# Patient Record
Sex: Male | Born: 1970 | Race: White | Hispanic: No | Marital: Married | Smoking: Former smoker
Health system: Southern US, Community
[De-identification: ages and names within clinical notes are randomized; demographics above are authoritative.]

## PROBLEM LIST (undated history)

## (undated) DIAGNOSIS — K219 Gastro-esophageal reflux disease without esophagitis: Secondary | ICD-10-CM

## (undated) DIAGNOSIS — G4730 Sleep apnea, unspecified: Secondary | ICD-10-CM

## (undated) DIAGNOSIS — T7840XA Allergy, unspecified, initial encounter: Secondary | ICD-10-CM

## (undated) HISTORY — PX: TUBAL LIGATION: SHX77

## (undated) HISTORY — DX: Gastro-esophageal reflux disease without esophagitis: K21.9

## (undated) HISTORY — DX: Allergy, unspecified, initial encounter: T78.40XA

## (undated) HISTORY — DX: Sleep apnea, unspecified: G47.30

## (undated) HISTORY — PX: VASECTOMY: SHX75

---

## 2001-05-12 ENCOUNTER — Ambulatory Visit (HOSPITAL_BASED_OUTPATIENT_CLINIC_OR_DEPARTMENT_OTHER): Admission: RE | Admit: 2001-05-12 | Discharge: 2001-05-12 | Payer: Self-pay | Admitting: Neurology

## 2004-10-25 ENCOUNTER — Ambulatory Visit: Payer: Self-pay | Admitting: Family Medicine

## 2004-11-01 ENCOUNTER — Ambulatory Visit: Payer: Self-pay | Admitting: Family Medicine

## 2006-02-13 ENCOUNTER — Ambulatory Visit: Payer: Self-pay | Admitting: Family Medicine

## 2010-02-04 ENCOUNTER — Ambulatory Visit: Payer: Self-pay | Admitting: Family Medicine

## 2010-02-04 DIAGNOSIS — L03221 Cellulitis of neck: Secondary | ICD-10-CM

## 2010-02-04 DIAGNOSIS — L0211 Cutaneous abscess of neck: Secondary | ICD-10-CM

## 2010-02-05 ENCOUNTER — Encounter: Payer: Self-pay | Admitting: Family Medicine

## 2010-02-08 ENCOUNTER — Ambulatory Visit: Payer: Self-pay | Admitting: Family Medicine

## 2010-03-05 ENCOUNTER — Ambulatory Visit: Payer: Self-pay | Admitting: Family Medicine

## 2010-03-05 LAB — CONVERTED CEMR LAB
Albumin: 4.3 g/dL (ref 3.5–5.2)
Basophils Relative: 0.7 % (ref 0.0–3.0)
Bilirubin Urine: NEGATIVE
CO2: 29 meq/L (ref 19–32)
Chloride: 104 meq/L (ref 96–112)
Creatinine, Ser: 1 mg/dL (ref 0.4–1.5)
Eosinophils Absolute: 0.2 10*3/uL (ref 0.0–0.7)
Glucose, Urine, Semiquant: NEGATIVE
Hemoglobin: 14.9 g/dL (ref 13.0–17.0)
Ketones, urine, test strip: NEGATIVE
MCHC: 35 g/dL (ref 30.0–36.0)
MCV: 92.1 fL (ref 78.0–100.0)
Monocytes Absolute: 0.4 10*3/uL (ref 0.1–1.0)
Neutro Abs: 2.8 10*3/uL (ref 1.4–7.7)
Nitrite: NEGATIVE
RBC: 4.63 M/uL (ref 4.22–5.81)
Specific Gravity, Urine: <1.005
Total CHOL/HDL Ratio: 4
Total Protein: 7.3 g/dL (ref 6.0–8.3)
Triglycerides: 39 mg/dL (ref 0.0–149.0)

## 2010-03-12 ENCOUNTER — Ambulatory Visit: Payer: Self-pay | Admitting: Family Medicine

## 2010-03-12 DIAGNOSIS — G47419 Narcolepsy without cataplexy: Secondary | ICD-10-CM

## 2010-03-18 ENCOUNTER — Encounter: Payer: Self-pay | Admitting: Family Medicine

## 2010-08-19 ENCOUNTER — Telehealth: Payer: Self-pay | Admitting: Family Medicine

## 2010-12-26 NOTE — Progress Notes (Signed)
Summary: refill ritalin  Phone Note Call from Patient Call back at Home Phone [phone]   Reason for Call: Refill Medication Summary of Call: generic ritalin, 3mos refills.  Call when ready for pickup.  Rudy Jew, RN  August 19, 2010 12:08 PM  Initial call taken by: Rudy Jew, RN,  August 19, 2010 11:34 AM  Follow-up for Phone Call        Pt is aware that Rx is ready for pick-up Follow-up by: Kathrynn Speed CMA,  August 19, 2010 1:53 PM    New/Updated Medications: METHYLPHENIDATE HCL 10 MG TABS (METHYLPHENIDATE HCL) Take 1 tablet by mouth every morning METHYLPHENIDATE HCL 10 MG TABS (METHYLPHENIDATE HCL) take one tab by mouth every morning  fill in one month METHYLPHENIDATE HCL 10 MG TABS (METHYLPHENIDATE HCL) take one tab by mouth every morning  fill in two months Prescriptions: METHYLPHENIDATE HCL 10 MG TABS (METHYLPHENIDATE HCL) take one tab by mouth every morning  fill in two months  #30 x 0   Entered by:   Kern Reap CMA (AAMA)   Authorized by:   Roderick Pee MD   Signed by:   Kern Reap CMA (AAMA) on 08/19/2010   Method used:   Print then Give to Patient   RxID:   [id number] METHYLPHENIDATE HCL 10 MG TABS (METHYLPHENIDATE HCL) take one tab by mouth every morning  fill in one month  #30 x 0   Entered by:   Kern Reap CMA (AAMA)   Authorized by:   Roderick Pee MD   Signed by:   Kern Reap CMA (AAMA) on 08/19/2010   Method used:   Print then Give to Patient   RxID:   [id number] METHYLPHENIDATE HCL 10 MG TABS (METHYLPHENIDATE HCL) Take 1 tablet by mouth every morning  #30 x 0   Entered by:   Kern Reap CMA (AAMA)   Authorized by:   Roderick Pee MD   Signed by:   Kern Reap CMA (AAMA) on 08/19/2010   Method used:   Print then Give to Patient   RxID:   [id number]

## 2010-12-26 NOTE — Medication Information (Signed)
Summary: Coverage Approval for Methylphenidate Hcl  Coverage Approval for Methylphenidate Hcl   Imported By: Maryln Gottron 03/21/2010 10:42:28  _____________________________________________________________________  External Attachment:    Type:   Image     Comment:   External Document

## 2010-12-26 NOTE — Assessment & Plan Note (Signed)
Summary: reest---ingrown hair-ok per rachel//ccm   Vital Signs:  Patient profile:   40 year old male Height:      71 inches Weight:      188 pounds BMI:     26.32 Temp:     98.7 degrees F oral BP sitting:   120 / 84  (left arm) Cuff size:   regular  Vitals Entered By: Kern Reap CMA Duncan Dull) (February 04, 2010 11:46 AM)  Reason for Visit re-establish care, ingrown hair on neck  History of Present Illness: Samuel Patterson is a 40 year old male, who comes in today as a new patient for evaluation of a boil in the midline of the scalp x 1 week.  He notices a week, ago.  It's been draining pus.  It is not healed.  He saw his been in excellent health except he has narcolepsy.  He's treated by neurology and on Provigil to her milligrams daily  Preventive Screening-Counseling & Management  Alcohol-Tobacco     Smoking Status: never      Drug Use:  no.    Allergies (verified): No Known Drug Allergies  Past History:  Past medical, surgical, family and social histories (including risk factors) reviewed for relevance to current acute and chronic problems.  Past Surgical History: Denies surgical history  Family History: Reviewed history and no changes required. Father: healty Mother: healthy Siblings: 1 brother healthy  Social History: Reviewed history and no changes required. Occupation: [employer] Single Never Smoked Alcohol use-yes Drug use-no Smoking Status:  never Drug Use:  no  Review of Systems      See HPI  Physical Exam  General:  Well-developed,well-nourished,in no acute distress; alert,appropriate and cooperative throughout examination Skin:  Robin egg -sized low midline posterior scalp, pus was cultured   Impression & Recommendations:  Problem # 1:  CELLULITIS AND ABSCESS OF NECK (ICD-682.1) Assessment New  Orders: Prescription Created Electronically [phone]) T-Culture, Wound ([id number]) Specimen Handling (98119)  His updated medication list for this  problem includes:    Doxycycline Hyclate 100 Mg Caps (Doxycycline hyclate) .Marland Kitchen... Take 1 tablet by mouth two times a day    Septra Ds 800-160 Mg Tabs (Sulfamethoxazole-trimethoprim) .Marland Kitchen... Take 1 tablet by mouth two times a day  Complete Medication List: 1)  Provigil 200 Mg Tabs (Modafinil) .... Take one tab once daily 2)  Doxycycline Hyclate 100 Mg Caps (Doxycycline hyclate) .... Take 1 tablet by mouth two times a day 3)  Septra Ds 800-160 Mg Tabs (Sulfamethoxazole-trimethoprim) .... Take 1 tablet by mouth two times a day  Patient Instructions: 1)  begin doxycycline and Septra, one of each twice a day.  We will call you to the report on your culture return in two weeks for follow-up Prescriptions: SEPTRA DS 800-160 MG TABS (SULFAMETHOXAZOLE-TRIMETHOPRIM) Take 1 tablet by mouth two times a day  #30 x 1   Entered and Authorized by:   Roderick Pee MD   Signed by:   Roderick Pee MD on 02/04/2010   Method used:   Electronically to        [facility]. [phone]* (retail)       [address]       [place], [place]  [address]       Ph: [phone]       Fax: [phone]   RxID:   [id number] DOXYCYCLINE HYCLATE 100 MG CAPS (DOXYCYCLINE HYCLATE) Take 1 tablet by mouth two times a day  #30  x 1   Entered and Authorized by:   Roderick Pee MD   Signed by:   Roderick Pee MD on 02/04/2010   Method used:   Electronically to        [facility]. [phone]* (retail)       [address]       [place], [place]  [address]       Ph: [phone]       Fax: [phone]   RxID:   [id number]    Immunization History:  Tetanus/Td Immunization History:    Tetanus/Td:  historical (11/24/2001)

## 2010-12-26 NOTE — Assessment & Plan Note (Signed)
Summary: CPX // RS   Vital Signs:  Patient profile:   40 year old male Height:      69.5 inches Weight:      180 pounds Temp:     98.4 degrees F oral BP sitting:   114 / 80  (left arm) Cuff size:   regular  Vitals Entered By: Kern Reap CMA Duncan Dull) (March 12, 2010 2:08 PM) CC: cpx   CC:  cpx.  History of Present Illness: Samuel Patterson is a 40 year old, married male, nonsmoker, who comes in today for general physical examination  He saw his been in excellent health.  He had no chronic health problems except for narcolepsy for which he takes Provigil two in milligrams daily however, his insurance only covers that now with a $55 co-pay.  They will cover the methylphenidate with a $10 co-pay.  He also has had congestion, associated with changes in atmospheric pressure.  He has no allergic rhinitis component.  Review of systems negative.  Tetanus booster 2003  Allergies: No Known Drug Allergies  Past History:  Past medical, surgical, family and social histories (including risk factors) reviewed, and no changes noted (except as noted below).  Past Medical History: vasomotor rhinitis narcolepsy  Past Surgical History: Reviewed history from 02/04/2010 and no changes required. Denies surgical history  Family History: Reviewed history from 02/04/2010 and no changes required. Father: healty Mother: healthy Siblings: 1 brother healthy  Social History: Reviewed history from 02/04/2010 and no changes required. Occupation: [employer] Single Never Smoked Alcohol use-yes Drug use-no  Review of Systems      See HPI  Physical Exam  General:  Well-developed,well-nourished,in no acute distress; alert,appropriate and cooperative throughout examination Head:  Normocephalic and atraumatic without obvious abnormalities. No apparent alopecia or balding. Eyes:  No corneal or conjunctival inflammation noted. EOMI. Perrla. Funduscopic exam benign, without hemorrhages, exudates or papilledema.  Vision grossly normal. Ears:  External ear exam shows no significant lesions or deformities.  Otoscopic examination reveals clear canals, tympanic membranes are intact bilaterally without bulging, retraction, inflammation or discharge. Hearing is grossly normal bilaterally. Nose:  External nasal examination shows no deformity or inflammation. Nasal mucosa are pink and moist without lesions or exudates. Mouth:  Oral mucosa and oropharynx without lesions or exudates.  Teeth in good repair. Neck:  No deformities, masses, or tenderness noted. Chest Wall:  No deformities, masses, tenderness or gynecomastia noted. Breasts:  No masses or gynecomastia noted Lungs:  Normal respiratory effort, chest expands symmetrically. Lungs are clear to auscultation, no crackles or wheezes. Heart:  Normal rate and regular rhythm. S1 and S2 normal without gallop, murmur, click, rub or other extra sounds. Abdomen:  Bowel sounds positive,abdomen soft and non-tender without masses, organomegaly or hernias noted. Rectal:  No external abnormalities noted. Normal sphincter tone. No rectal masses or tenderness. Genitalia:  Testes bilaterally descended without nodularity, tenderness or masses. No scrotal masses or lesions. No penis lesions or urethral discharge. Prostate:  Prostate gland firm and smooth, no enlargement, nodularity, tenderness, mass, asymmetry or induration. Msk:  No deformity or scoliosis noted of thoracic or lumbar spine.   Pulses:  R and L carotid,radial,femoral,dorsalis pedis and posterior tibial pulses are full and equal bilaterally Extremities:  No clubbing, cyanosis, edema, or deformity noted with normal full range of motion of all joints.   Neurologic:  No cranial nerve deficits noted. Station and gait are normal. Plantar reflexes are down-going bilaterally. DTRs are symmetrical throughout. Sensory, motor and coordinative functions appear intact. Skin:  Intact without  suspicious lesions or rashes Cervical  Nodes:  No lymphadenopathy noted Axillary Nodes:  No palpable lymphadenopathy Inguinal Nodes:  No significant adenopathy Psych:  Cognition and judgment appear intact. Alert and cooperative with normal attention span and concentration. No apparent delusions, illusions, hallucinations   Impression & Recommendations:  Problem # 1:  Preventive Health Care (ICD-V70.0) Assessment Unchanged  Orders: EKG w/ Interpretation (93000)  Problem # 2:  NARCOLEPSY WITHOUT CATAPLEXY (ICD-347.00) Assessment: New  Orders: EKG w/ Interpretation (93000)  Complete Medication List: 1)  Daily Multi Tabs (Multiple vitamins-minerals) .... Once daily 2)  Triple Flex 500-400-125 Mg Tabs (Glucosamine-chondroitin-msm) .... Once daily 3)  Methylphenidate Hcl 10 Mg Tabs (Methylphenidate hcl) .... Take 1 tablet by mouth every morning  Patient Instructions: 1)  take 10 mg of methylphenidate daily. 2)  Return yearly for your annual exam Prescriptions: METHYLPHENIDATE HCL 10 MG TABS (METHYLPHENIDATE HCL) Take 1 tablet by mouth every morning  #30 x 0   Entered and Authorized by:   Roderick Pee MD   Signed by:   Roderick Pee MD on 03/12/2010   Method used:   Print then Give to Patient   RxID:   [id number] METHYLPHENIDATE HCL 10 MG TABS (METHYLPHENIDATE HCL) Take 1 tablet by mouth every morning  #30 x 0   Entered and Authorized by:   Roderick Pee MD   Signed by:   Roderick Pee MD on 03/12/2010   Method used:   Print then Give to Patient   RxID:   [id number] METHYLPHENIDATE HCL 10 MG TABS (METHYLPHENIDATE HCL) Take 1 tablet by mouth every morning  #30 x 0   Entered and Authorized by:   Roderick Pee MD   Signed by:   Roderick Pee MD on 03/12/2010   Method used:   Print then Give to Patient   RxID:   [id number]

## 2010-12-26 NOTE — Assessment & Plan Note (Signed)
Summary: follow up culture - rv   Vital Signs:  Patient profile:   40 year old male BP sitting:   110 / 80  (left arm) Cuff size:   regular  Vitals Entered By: Kern Reap CMA Duncan Dull) (February 08, 2010 11:49 AM)  Reason for Visit follow up office visit  History of Present Illness: Akin is a 40 year old male, nonsmoker, who comes in today for follow-up of MRSA.  He had a boil on the nape of his neck.  A week ago.  We started him on doxycycline and Septra one of each b.i.d.  The lesion is 99% gone.  Culture grew out MRSA  Allergies: No Known Drug Allergies  Review of Systems      See HPI  Physical Exam  General:  Well-developed,well-nourished,in no acute distress; alert,appropriate and cooperative throughout examination Skin:  lesion completely resolved   Impression & Recommendations:  Problem # 1:  CELLULITIS AND ABSCESS OF NECK (ICD-682.1) Assessment Improved  His updated medication list for this problem includes:    Doxycycline Hyclate 100 Mg Caps (Doxycycline hyclate) .Marland Kitchen... Take 1 tablet by mouth two times a day    Septra Ds 800-160 Mg Tabs (Sulfamethoxazole-trimethoprim) .Marland Kitchen... Take 1 tablet by mouth two times a day  Complete Medication List: 1)  Provigil 200 Mg Tabs (Modafinil) .... Take one tab once daily 2)  Doxycycline Hyclate 100 Mg Caps (Doxycycline hyclate) .... Take 1 tablet by mouth two times a day 3)  Septra Ds 800-160 Mg Tabs (Sulfamethoxazole-trimethoprim) .... Take 1 tablet by mouth two times a day  Patient Instructions: 1)  take doxycycline and Septra, one of each daily times bottle empty.  Return p.r.n.

## 2011-05-14 ENCOUNTER — Encounter: Payer: Self-pay | Admitting: Family Medicine

## 2011-05-15 ENCOUNTER — Encounter: Payer: Self-pay | Admitting: Family Medicine

## 2011-05-15 ENCOUNTER — Ambulatory Visit (INDEPENDENT_AMBULATORY_CARE_PROVIDER_SITE_OTHER): Payer: BC Managed Care – PPO | Admitting: Family Medicine

## 2011-05-15 VITALS — BP 120/84 | Temp 98.0°F | Ht 71.0 in | Wt 186.0 lb

## 2011-05-15 DIAGNOSIS — G47419 Narcolepsy without cataplexy: Secondary | ICD-10-CM

## 2011-05-15 MED ORDER — METHYLPHENIDATE HCL 10 MG PO TABS: ORAL_TABLET | ORAL | Status: DC

## 2011-05-15 NOTE — Progress Notes (Signed)
  Subjective:    Patient ID: Samuel Patterson, male    DOB: 1971-06-10, 40 y.o.   MRN: [id number]  HPIMichael is a delightful 40 year old, single male, nonsmoker, who comes in today for evaluation of two problems.  He has a history of narcolepsy for which he takes Ritalin 10 mg daily.  He says is working well, but two to 3 days a week and afternoon, and not off.  We discussed the fact that he might want to increase the dose to twice daily. He says his fiance says that he snores a lot.  No sleep apnea    Review of Systems General pulmonary via systems otherwise negative    Objective:   Physical Exam Well developed, well nourished to male no acute distress.  HEENT pertinent that the septum is deviated to the right with significant obstruction.       Assessment & Plan:  Narcolepsy increase Ritalin to 10 b.i.d.  Septal deviation.  ENT consult p.r.n.

## 2011-05-15 NOTE — Patient Instructions (Addendum)
Increase the dose to 10 mg twice daily.  Call when you or two weeks from being out of his third prescription for refills.  Follow-up with me in one year or sooner if any problems

## 2011-11-03 ENCOUNTER — Ambulatory Visit (INDEPENDENT_AMBULATORY_CARE_PROVIDER_SITE_OTHER): Payer: BC Managed Care – PPO | Admitting: Family Medicine

## 2011-11-03 ENCOUNTER — Encounter: Payer: Self-pay | Admitting: Family Medicine

## 2011-11-03 VITALS — BP 110/80 | Temp 98.3°F | Wt 186.0 lb

## 2011-11-03 DIAGNOSIS — K644 Residual hemorrhoidal skin tags: Secondary | ICD-10-CM | POA: Insufficient documentation

## 2011-11-03 DIAGNOSIS — G47419 Narcolepsy without cataplexy: Secondary | ICD-10-CM

## 2011-11-03 MED ORDER — HYDROCORTISONE 2.5 % RE CREA: TOPICAL_CREAM | RECTAL | Status: AC

## 2011-11-03 MED ORDER — METHYLPHENIDATE HCL 10 MG PO TABS: ORAL_TABLET | ORAL | Status: DC

## 2011-11-03 NOTE — Patient Instructions (Signed)
Soak nightly  Apply .  The Anusol cream twice daily.  Return p.r.n.

## 2011-11-03 NOTE — Progress Notes (Signed)
  Subjective:    Patient ID: Samuel Patterson, male    DOB: 19-Jul-1971, 40 y.o.   MRN: [id number]  HPI Samuel Patterson is a 40 year old male, who comes in today for evaluation of external hemorrhoids.  This is been bothering him for a couple weeks.  Pain with external somewhat painful.  Occasional bleeding.  Bowel movements normal   Review of Systems General and GI review of systems otherwise negative    Objective:   Physical Exam Well-developed well-nourished, male in no acute distress.  Examination of rectum shows small areas of excoriation redness.  No obvious hemorrhoids      Assessment & Plan:  External hemorrhoids with a lot of irritation.  Plan warm soaks Anusol cream.  Return p.r.n.

## 2012-02-12 ENCOUNTER — Other Ambulatory Visit: Payer: Self-pay | Admitting: Otolaryngology

## 2012-02-12 ENCOUNTER — Ambulatory Visit
Admission: RE | Admit: 2012-02-12 | Discharge: 2012-02-12 | Disposition: A | Discharge status: Home / Self Care | Payer: BC Managed Care – PPO | Source: Ambulatory Visit | Attending: Otolaryngology | Admitting: Otolaryngology

## 2012-03-01 ENCOUNTER — Encounter: Payer: Self-pay | Admitting: Family Medicine

## 2012-03-01 ENCOUNTER — Ambulatory Visit (INDEPENDENT_AMBULATORY_CARE_PROVIDER_SITE_OTHER): Payer: BC Managed Care – PPO | Admitting: Family Medicine

## 2012-03-01 VITALS — BP 104/78 | Temp 100.3°F | Wt 191.0 lb

## 2012-03-01 DIAGNOSIS — L03317 Cellulitis of buttock: Secondary | ICD-10-CM

## 2012-03-01 DIAGNOSIS — L0231 Cutaneous abscess of buttock: Secondary | ICD-10-CM | POA: Insufficient documentation

## 2012-03-01 MED ORDER — DOXYCYCLINE HYCLATE 100 MG PO TABS
100.0000 mg | ORAL_TABLET | Freq: Two times a day (BID) | ORAL | Status: AC
Start: 2012-03-01 — End: 2012-03-11

## 2012-03-01 MED ORDER — SULFAMETHOXAZOLE-TRIMETHOPRIM 800-160 MG PO TABS: 1.0000 | ORAL_TABLET | Freq: Two times a day (BID) | ORAL | Status: AC

## 2012-03-01 MED ORDER — CEFTRIAXONE SODIUM 1 G IJ SOLR
1.0000 g | INTRAMUSCULAR | Status: AC
  Administered 2012-03-01: 1 g via INTRAMUSCULAR

## 2012-03-01 NOTE — Patient Instructions (Signed)
Rest at home  Soak in a hot tub twice daily  Bed rest with heating padlllllllllll low heat and cover the heating pad with a towel sado Bernier skin  Doxycycline and Septra,,,,,,,,,,,,,, one of each twice daily   return on Thursday for followup

## 2012-03-01 NOTE — Progress Notes (Signed)
  Subjective:    Patient ID: Samuel Patterson, male    DOB: Feb 23, 1971, 41 y.o.   MRN: [id number]  HPI Samuel Patterson is a 41 year old married male nonsmoker who comes in today for evaluation of an abscess on his right buttocks  On Saturday he thought he was getting the flu he had fever and chills no viral symptoms then Sunday he noticed some pain and swelling and redness in his right buttocks area. He's had MRSA before   Review of Systems    general and infectious review of systems otherwise negative Objective:   Physical Exam  Well-developed well-nourished male in no acute distress examination of the right buttock shows a golf ball size abscess pus draining culture done      Assessment & Plan:  Cellulitis with abscess right buttocks probable MRSA plan 1 g of Rocephin IM doxy and Septra rest at home followup in 3 days

## 2012-03-04 ENCOUNTER — Encounter: Payer: Self-pay | Admitting: Family Medicine

## 2012-03-04 ENCOUNTER — Ambulatory Visit (INDEPENDENT_AMBULATORY_CARE_PROVIDER_SITE_OTHER): Payer: BC Managed Care – PPO | Admitting: Family Medicine

## 2012-03-04 VITALS — BP 110/70 | Temp 98.3°F | Wt 185.0 lb

## 2012-03-04 DIAGNOSIS — L0231 Cutaneous abscess of buttock: Secondary | ICD-10-CM

## 2012-03-04 DIAGNOSIS — L03317 Cellulitis of buttock: Secondary | ICD-10-CM

## 2012-03-04 LAB — WOUND CULTURE

## 2012-03-04 NOTE — Progress Notes (Signed)
  Subjective:    Patient ID: [name], male    DOB: 06-23-1971, 41 y.o.   MRN: [id number]  HPI Samuel Patterson is a 41 year old male who comes in today for followup of an abscess on his right proximal  He's on a combination of doxycycline and Septra and the lesion is draining. It's much smaller still red but less culture shows MRSA   Review of Systems    general and dermatologic review of systems otherwise negative well-developed well-nourished male in no acute distress examination of skin shows a marble size abscess the other day was about the size of a golf ball the redness is diminished but not gone still draining nicely Objective:   Physical Exam  MRSA abscess right buttocks improving with hot soaks and antibiotics continue above return when necessary      Assessment & Plan:  MRSA continue

## 2012-03-04 NOTE — Patient Instructions (Signed)
Continue the doxycycline and Septra. If when you finish the first round the lesion is completely healed no redness no draining then stop the antibiotics if however there is still any redness refill the Septra and doxycycline and take it for another round return when necessary or

## 2012-05-05 ENCOUNTER — Other Ambulatory Visit: Payer: Self-pay | Admitting: Family Medicine

## 2012-05-05 DIAGNOSIS — G47419 Narcolepsy without cataplexy: Secondary | ICD-10-CM

## 2012-05-05 MED ORDER — METHYLPHENIDATE HCL 10 MG PO TABS: ORAL_TABLET | ORAL | Status: DC

## 2012-05-05 NOTE — Telephone Encounter (Signed)
Rx ready for pick up and patient is aware 

## 2012-05-05 NOTE — Telephone Encounter (Signed)
Pt is requesting 3 separate new rx for ritalin 10mg  for 3 months.

## 2012-09-13 ENCOUNTER — Ambulatory Visit (INDEPENDENT_AMBULATORY_CARE_PROVIDER_SITE_OTHER): Payer: BC Managed Care – PPO | Admitting: Family Medicine

## 2012-09-13 ENCOUNTER — Encounter: Payer: Self-pay | Admitting: Family Medicine

## 2012-09-13 VITALS — BP 104/80 | HR 85 | Temp 98.2°F | Wt 188.0 lb

## 2012-09-13 DIAGNOSIS — L0390 Cellulitis, unspecified: Secondary | ICD-10-CM

## 2012-09-13 DIAGNOSIS — W57XXXA Bitten or stung by nonvenomous insect and other nonvenomous arthropods, initial encounter: Secondary | ICD-10-CM

## 2012-09-13 DIAGNOSIS — T148 Other injury of unspecified body region: Secondary | ICD-10-CM

## 2012-09-13 DIAGNOSIS — Z23 Encounter for immunization: Secondary | ICD-10-CM

## 2012-09-13 MED ORDER — DOXYCYCLINE HYCLATE 100 MG PO TABS: 100.0000 mg | ORAL_TABLET | Freq: Two times a day (BID) | ORAL | Status: DC

## 2012-09-13 NOTE — Progress Notes (Signed)
Chief Complaint  Patient presents with  . Tick Removal    HPI:  Tick Bite: -noticed tick attached on R side yesterday, patient removed tick, thinks it was biting for 1 day -has been itchy and a little red and tender in area -denies: rash, fevers, HAs, chills -tetanus: > 10 years ago   ROS: See pertinent positives and negatives per HPI.  Past Medical History  Diagnosis Date  . Narcolepsy   . Allergy     vasomotor rhinitis    No family history on file.  History   Social History  . Marital Status: Married    Spouse Name: N/A    Number of Children: N/A  . Years of Education: N/A   Social History Main Topics  . Smoking status: Never Smoker   . Smokeless tobacco: None  . Alcohol Use: Yes  . Drug Use: No  . Sexually Active:    Other Topics Concern  . None   Social History Narrative  . None    Current outpatient prescriptions:Casanthranol-Docusate Sodium 30-100 MG CAPS, Take by mouth.  , Disp: , Rfl: ;  Glucosamine-Chondroitin-MSM (TRIPLE FLEX) 500-400-125 MG TABS, Take by mouth daily.  , Disp: , Rfl: ;  hydrocortisone (ANUSOL-HC) 2.5 % rectal cream, Apply rectally 2 times daily, Disp: 30 g, Rfl: 2;  methylphenidate (RITALIN) 10 MG tablet, One p.o. B.i.d., Disp: 60 tablet, Rfl: 0 methylphenidate (RITALIN) 10 MG tablet, One p.o. B.i.d., Disp: 60 tablet, Rfl: 0;  methylphenidate (RITALIN) 10 MG tablet, One p.o. B.i.d., Disp: 60 tablet, Rfl: 0;  Multiple Vitamin (MULTIVITAMIN) tablet, Take 1 tablet by mouth daily.  , Disp: , Rfl: ;  doxycycline (VIBRA-TABS) 100 MG tablet, Take 1 tablet (100 mg total) by mouth 2 (two) times daily., Disp: 20 tablet, Rfl: 0  EXAM:  Filed Vitals:   09/13/12 1007  BP: 104/80  Pulse: 85  Temp: 98.2 F (36.8 C)    There is no height on file to calculate BMI.  GENERAL: vitals reviewed and listed above, alert, oriented, appears well hydrated and in no acute distress  HEENT: atraumatic, conjunttiva clear, no obvious abnormalities on  inspection of external nose and ears  NECK: no obvious masses on inspection  SKIN: small erythematous papule R chest with small amount surrounding erythema (approc 1.5 cm in diameter)  MS: moves all extremities without noticeable abnormality  PSYCH: pleasant and cooperative, no obvious depression or anxiety  ASSESSMENT AND PLAN:  Discussed the following assessment and plan:  1. Cellulitis  doxycycline (VIBRA-TABS) 100 MG tablet  2. Tick bite  doxycycline (VIBRA-TABS) 100 MG tablet   -tdap -abx per above for mild cellulitis and pt concern. Discussed risks and benefits. -Patient advised to return or notify a doctor immediately if symptoms worsen or persist or new concerns arise.  There are no Patient Instructions on file for this visit.   Kriste Basque R.

## 2012-09-13 NOTE — Patient Instructions (Addendum)
-  mild cellulitis surrounding tick bite - will treat with antibiotic  -if increased redness, swelling, fevers or other concerns see your doctor

## 2012-09-13 NOTE — Addendum Note (Signed)
Addended by: Azucena Freed on: 09/13/2012 01:37 PM   Modules accepted: Orders

## 2012-11-04 ENCOUNTER — Other Ambulatory Visit (INDEPENDENT_AMBULATORY_CARE_PROVIDER_SITE_OTHER): Payer: BC Managed Care – PPO

## 2012-11-04 DIAGNOSIS — Z0000 Encounter for general adult medical examination without abnormal findings: Secondary | ICD-10-CM

## 2012-11-04 LAB — LIPID PANEL
Cholesterol: 199 mg/dL (ref 0–200)
HDL: 41.5 mg/dL (ref >39.00)
LDL Cholesterol: 143 mg/dL — ABNORMAL HIGH (ref 0–99)
Total CHOL/HDL Ratio: 5
Triglycerides: 71 mg/dL (ref 0.0–149.0)
VLDL: 14.2 mg/dL (ref 0.0–40.0)

## 2012-11-04 LAB — POCT URINALYSIS DIPSTICK
Bilirubin, UA: NEGATIVE
Glucose, UA: NEGATIVE
Ketones, UA: NEGATIVE
Leukocytes, UA: NEGATIVE
Nitrite, UA: NEGATIVE
Protein, UA: NEGATIVE
Spec Grav, UA: 1.01
Urobilinogen, UA: 0.2
pH, UA: 7

## 2012-11-04 LAB — CBC WITH DIFFERENTIAL/PLATELET
Basophils Absolute: 0 10*3/uL (ref 0.0–0.1)
Basophils Relative: 0.4 % (ref 0.0–3.0)
Eosinophils Absolute: 0.2 10*3/uL (ref 0.0–0.7)
Eosinophils Relative: 3.4 % (ref 0.0–5.0)
HCT: 44.3 % (ref 39.0–52.0)
Hemoglobin: 15 g/dL (ref 13.0–17.0)
Lymphocytes Relative: 18.8 % (ref 12.0–46.0)
Lymphs Abs: 1.2 10*3/uL (ref 0.7–4.0)
MCHC: 33.7 g/dL (ref 30.0–36.0)
MCV: 90.6 fl (ref 78.0–100.0)
Monocytes Absolute: 0.5 10*3/uL (ref 0.1–1.0)
Monocytes Relative: 8.5 % (ref 3.0–12.0)
Neutro Abs: 4.4 10*3/uL (ref 1.4–7.7)
Neutrophils Relative %: 68.9 % (ref 43.0–77.0)
Platelets: 200 10*3/uL (ref 150.0–400.0)
RBC: 4.9 Mil/uL (ref 4.22–5.81)
RDW: 13.5 % (ref 11.5–14.6)
WBC: 6.4 10*3/uL (ref 4.5–10.5)

## 2012-11-04 LAB — BASIC METABOLIC PANEL
BUN: 14 mg/dL (ref 6–23)
CO2: 29 mEq/L (ref 19–32)
Calcium: 8.9 mg/dL (ref 8.4–10.5)
GFR: 106.01 mL/min (ref >60.00)
Glucose, Bld: 97 mg/dL (ref 70–99)

## 2012-11-04 LAB — TSH: TSH: 2.56 u[IU]/mL (ref 0.35–5.50)

## 2012-11-04 LAB — HEPATIC FUNCTION PANEL
Albumin: 4.1 g/dL (ref 3.5–5.2)
Alkaline Phosphatase: 53 U/L (ref 39–117)
Total Protein: 7 g/dL (ref 6.0–8.3)

## 2012-11-09 ENCOUNTER — Encounter: Payer: Self-pay | Admitting: Family Medicine

## 2012-11-09 ENCOUNTER — Ambulatory Visit (INDEPENDENT_AMBULATORY_CARE_PROVIDER_SITE_OTHER): Payer: BC Managed Care – PPO | Admitting: Family Medicine

## 2012-11-09 VITALS — BP 120/80 | Temp 97.9°F | Ht 70.5 in | Wt 190.0 lb

## 2012-11-09 DIAGNOSIS — F988 Other specified behavioral and emotional disorders with onset usually occurring in childhood and adolescence: Secondary | ICD-10-CM | POA: Insufficient documentation

## 2012-11-09 DIAGNOSIS — G47419 Narcolepsy without cataplexy: Secondary | ICD-10-CM

## 2012-11-09 MED ORDER — METHYLPHENIDATE HCL 10 MG PO TABS: ORAL_TABLET | ORAL | Status: DC

## 2012-11-09 NOTE — Patient Instructions (Signed)
Continue your current medications  When you're 2 weeks from being out of your third prescription for Ritalin call and leave a voice mail for Fleet Contras so we can renew your medicines every 90 days  Return in one year for general physical sooner if any problems

## 2012-11-09 NOTE — Progress Notes (Signed)
  Subjective:    Patient ID: Samuel Patterson, male    DOB: Aug 13, 1971, 41 y.o.   MRN: [id number]  HPI Samuel Patterson is a 41 year old male who comes in today for general physical examination. He has a history of narcolepsy and is on Ritalin 10 mg twice a day and doing well no complications  He gets routine eye care, dental care, tetanus 2013, seasonal flu shot September 2013   Review of Systems  Constitutional: Negative.   HENT: Negative.   Eyes: Negative.   Respiratory: Negative.   Cardiovascular: Negative.   Gastrointestinal: Negative.   Genitourinary: Negative.   Musculoskeletal: Negative.   Skin: Negative.   Neurological: Negative.   Hematological: Negative.   Psychiatric/Behavioral: Negative.        Objective:   Physical Exam  Constitutional: He is oriented to person, place, and time. He appears well-developed and well-nourished.  HENT:  Head: Normocephalic and atraumatic.  Right Ear: External ear normal.  Left Ear: External ear normal.  Nose: Nose normal.  Mouth/Throat: Oropharynx is clear and moist.  Eyes: Conjunctivae normal and EOM are normal. Pupils are equal, round, and reactive to light.  Neck: Normal range of motion. Neck supple. No JVD present. No tracheal deviation present. No thyromegaly present.  Cardiovascular: Normal rate, regular rhythm, normal heart sounds and intact distal pulses.  Exam reveals no gallop and no friction rub.   No murmur heard. Pulmonary/Chest: Effort normal and breath sounds normal. No stridor. No respiratory distress. He has no wheezes. He has no rales. He exhibits no tenderness.  Abdominal: Soft. Bowel sounds are normal. He exhibits no distension and no mass. There is no tenderness. There is no rebound and no guarding.  Genitourinary: Rectum normal, prostate normal and penis normal. Guaiac negative stool. No penile tenderness.  Musculoskeletal: Normal range of motion. He exhibits no edema and no tenderness.  Lymphadenopathy:    He has no  cervical adenopathy.  Neurological: He is alert and oriented to person, place, and time. He has normal reflexes. No cranial nerve deficit. He exhibits normal muscle tone.  Skin: Skin is warm and dry. No rash noted. No erythema. No pallor.  Psychiatric: He has a normal mood and affect. His behavior is normal. Judgment and thought content normal.          Assessment & Plan:  Healthy male  Narcolepsy continue Ritalin 10 mg twice a day

## 2013-04-14 ENCOUNTER — Other Ambulatory Visit: Payer: Self-pay | Admitting: *Deleted

## 2013-04-14 MED ORDER — HYDROCORTISONE ACETATE 25 MG RE SUPP: 25.0000 mg | Freq: Two times a day (BID) | RECTAL | Status: DC

## 2013-06-24 ENCOUNTER — Other Ambulatory Visit: Payer: Self-pay | Admitting: *Deleted

## 2013-06-24 DIAGNOSIS — G47419 Narcolepsy without cataplexy: Secondary | ICD-10-CM

## 2013-06-24 MED ORDER — METHYLPHENIDATE HCL 10 MG PO TABS: ORAL_TABLET | ORAL | Status: DC

## 2013-06-24 NOTE — Telephone Encounter (Signed)
rx ready for pick up and patient is aware  

## 2013-06-29 ENCOUNTER — Telehealth: Payer: Self-pay | Admitting: Family Medicine

## 2013-06-29 NOTE — Telephone Encounter (Signed)
Samuel Patterson, this pt left me a voice mail when I was out. He states he is still unable to get his Ritalin filled. I am confused as to if he needs actual rx, or if pharm will not fill. His prior Berkley Harvey was done July 1, and is effective 05/03/13 - 05/24/2014, so that is not the reason. If the pharmacy is telling him insurance keeps kicking it back, then the pharmacy needs to call the [facility] Help Desk to get assistance processing the claim. Can you check with the pt to see what he means when he states he cannot get his Ritalin rx? Let me know if there is anything I can do.

## 2013-06-30 NOTE — Telephone Encounter (Signed)
Spoke with patient and informed him of the approval of his Ritalin

## 2013-12-08 ENCOUNTER — Other Ambulatory Visit (INDEPENDENT_AMBULATORY_CARE_PROVIDER_SITE_OTHER): Payer: BC Managed Care – PPO

## 2013-12-08 DIAGNOSIS — Z0000 Encounter for general adult medical examination without abnormal findings: Secondary | ICD-10-CM

## 2013-12-08 LAB — CBC WITH DIFFERENTIAL/PLATELET
BASOS ABS: 0 10*3/uL (ref 0.0–0.1)
Basophils Relative: 0.6 % (ref 0.0–3.0)
EOS ABS: 0.2 10*3/uL (ref 0.0–0.7)
Eosinophils Relative: 3.1 % (ref 0.0–5.0)
HCT: 44.2 % (ref 39.0–52.0)
Hemoglobin: 15.1 g/dL (ref 13.0–17.0)
LYMPHS PCT: 23 % (ref 12.0–46.0)
Lymphs Abs: 1.4 10*3/uL (ref 0.7–4.0)
MCHC: 34.1 g/dL (ref 30.0–36.0)
MCV: 90.2 fl (ref 78.0–100.0)
Monocytes Absolute: 0.5 10*3/uL (ref 0.1–1.0)
Monocytes Relative: 8.7 % (ref 3.0–12.0)
NEUTROS ABS: 4 10*3/uL (ref 1.4–7.7)
NEUTROS PCT: 64.6 % (ref 43.0–77.0)
PLATELETS: 201 10*3/uL (ref 150.0–400.0)
RBC: 4.9 Mil/uL (ref 4.22–5.81)
RDW: 13.1 % (ref 11.5–14.6)
WBC: 6.3 10*3/uL (ref 4.5–10.5)

## 2013-12-08 LAB — BASIC METABOLIC PANEL
BUN: 11 mg/dL (ref 6–23)
CALCIUM: 9.3 mg/dL (ref 8.4–10.5)
CO2: 28 meq/L (ref 19–32)
CREATININE: 1 mg/dL (ref 0.4–1.5)
Chloride: 105 mEq/L (ref 96–112)
GFR: 90.53 mL/min (ref >60.00)
GLUCOSE: 92 mg/dL (ref 70–99)
Potassium: 3.8 mEq/L (ref 3.5–5.1)
Sodium: 139 mEq/L (ref 135–145)

## 2013-12-08 LAB — TSH: TSH: 4.09 u[IU]/mL (ref 0.35–5.50)

## 2013-12-08 LAB — HEPATIC FUNCTION PANEL
ALBUMIN: 4.2 g/dL (ref 3.5–5.2)
ALK PHOS: 54 U/L (ref 39–117)
ALT: 30 U/L (ref 0–53)
AST: 21 U/L (ref 0–37)
BILIRUBIN DIRECT: 0.1 mg/dL (ref 0.0–0.3)
BILIRUBIN TOTAL: 0.7 mg/dL (ref 0.3–1.2)
Total Protein: 7.2 g/dL (ref 6.0–8.3)

## 2013-12-08 LAB — POCT URINALYSIS DIPSTICK
Bilirubin, UA: NEGATIVE
Blood, UA: NEGATIVE
Glucose, UA: NEGATIVE
Ketones, UA: NEGATIVE
Leukocytes, UA: NEGATIVE
Nitrite, UA: NEGATIVE
PROTEIN UA: NEGATIVE
Spec Grav, UA: 1.01
UROBILINOGEN UA: 0.2
pH, UA: 7

## 2013-12-08 LAB — LIPID PANEL
CHOL/HDL RATIO: 4
Cholesterol: 203 mg/dL — ABNORMAL HIGH (ref 0–200)
HDL: 46 mg/dL (ref >39.00)
Triglycerides: 145 mg/dL (ref 0.0–149.0)
VLDL: 29 mg/dL (ref 0.0–40.0)

## 2013-12-08 LAB — LDL CHOLESTEROL, DIRECT: Direct LDL: 137.2 mg/dL

## 2013-12-15 ENCOUNTER — Encounter: Payer: BC Managed Care – PPO | Admitting: Family Medicine

## 2013-12-20 ENCOUNTER — Encounter: Payer: Self-pay | Admitting: Family Medicine

## 2013-12-20 ENCOUNTER — Ambulatory Visit (INDEPENDENT_AMBULATORY_CARE_PROVIDER_SITE_OTHER): Payer: BC Managed Care – PPO | Admitting: Family Medicine

## 2013-12-20 VITALS — BP 130/80 | Temp 97.9°F | Ht 71.25 in | Wt 201.0 lb

## 2013-12-20 DIAGNOSIS — G47419 Narcolepsy without cataplexy: Secondary | ICD-10-CM

## 2013-12-20 MED ORDER — METHYLPHENIDATE HCL 10 MG PO TABS: ORAL_TABLET | ORAL | Status: DC

## 2013-12-20 NOTE — Progress Notes (Signed)
Pre visit review using our clinic review tool, if applicable. No additional management support is needed unless otherwise documented below in the visit note. 

## 2013-12-20 NOTE — Progress Notes (Signed)
   Subjective:    Patient ID: Samuel Patterson, male    DOB: 01/01/1971, 43 y.o.   MRN: [id number]  HPI Samuel Patterson is a 43 year old married male nonsmoker who comes in today for general physical examination  He has a history of narcolepsy and is on Ritalin 10 mg twice a day.  He says he otherwise feels well and has no complaints   Review of Systems  Constitutional: Negative.   HENT: Negative.   Eyes: Negative.   Respiratory: Negative.   Cardiovascular: Negative.   Gastrointestinal: Negative.   Endocrine: Negative.   Genitourinary: Negative.   Musculoskeletal: Negative.   Skin: Negative.   Allergic/Immunologic: Negative.   Neurological: Negative.   Hematological: Negative.   Psychiatric/Behavioral: Negative.        Objective:   Physical Exam  Nursing note and vitals reviewed. Constitutional: He is oriented to person, place, and time. He appears well-developed and well-nourished.  HENT:  Head: Normocephalic and atraumatic.  Right Ear: External ear normal.  Left Ear: External ear normal.  Nose: Nose normal.  Mouth/Throat: Oropharynx is clear and moist.  Eyes: Conjunctivae and EOM are normal. Pupils are equal, round, and reactive to light.  Neck: Normal range of motion. Neck supple. No JVD present. No tracheal deviation present. No thyromegaly present.  Cardiovascular: Normal rate, regular rhythm, normal heart sounds and intact distal pulses.  Exam reveals no gallop and no friction rub.   No murmur heard. Pulmonary/Chest: Effort normal and breath sounds normal. No stridor. No respiratory distress. He has no wheezes. He has no rales. He exhibits no tenderness.  Abdominal: Soft. Bowel sounds are normal. He exhibits no distension and no mass. There is no tenderness. There is no rebound and no guarding.  Genitourinary:  Exam normal penis right testicle normal varicocele in the left otherwise normal  Musculoskeletal: Normal range of motion. He exhibits no edema and no tenderness.    Lymphadenopathy:    He has no cervical adenopathy.  Neurological: He is alert and oriented to person, place, and time. He has normal reflexes. No cranial nerve deficit. He exhibits normal muscle tone.  Skin: Skin is warm and dry. No rash noted. No erythema. No pallor.  Psychiatric: He has a normal mood and affect. His behavior is normal. Judgment and thought content normal.          Assessment & Plan:  Healthy male  History of narcolepsy continue Ritalin 10 mg twice a day

## 2013-12-20 NOTE — Patient Instructions (Signed)
Continue the Ritalin 10 mg twice a day  Call 2 weeks from being out of your third prescriptions and leave a voicemail with Fleet ContrasRachel 4 refills  Return in January 2015 for your next exam  Soak and file your toenails once weekly to get rid of the fundus

## 2014-03-09 ENCOUNTER — Ambulatory Visit (INDEPENDENT_AMBULATORY_CARE_PROVIDER_SITE_OTHER): Payer: BC Managed Care – PPO | Admitting: Family Medicine

## 2014-03-09 ENCOUNTER — Encounter: Payer: Self-pay | Admitting: Family Medicine

## 2014-03-09 VITALS — BP 120/80

## 2014-03-09 DIAGNOSIS — G47419 Narcolepsy without cataplexy: Secondary | ICD-10-CM

## 2014-03-09 MED ORDER — METHYLPHENIDATE HCL 10 MG PO TABS: ORAL_TABLET | ORAL | Status: DC

## 2014-03-09 NOTE — Progress Notes (Signed)
Pre visit review using our clinic review tool, if applicable. No additional management support is needed unless otherwise documented below in the visit note. 

## 2014-09-22 ENCOUNTER — Telehealth: Payer: Self-pay | Admitting: Family Medicine

## 2014-09-22 DIAGNOSIS — G47419 Narcolepsy without cataplexy: Secondary | ICD-10-CM

## 2014-09-22 NOTE — Telephone Encounter (Signed)
Pt needs new rx methylphenidate °

## 2014-09-25 MED ORDER — METHYLPHENIDATE HCL 10 MG PO TABS: ORAL_TABLET | ORAL | Status: DC

## 2014-09-25 NOTE — Telephone Encounter (Signed)
Rx ready for pick up and patient is aware 

## 2015-01-18 ENCOUNTER — Other Ambulatory Visit (INDEPENDENT_AMBULATORY_CARE_PROVIDER_SITE_OTHER): Payer: BC Managed Care – PPO

## 2015-01-18 DIAGNOSIS — Z0000 Encounter for general adult medical examination without abnormal findings: Secondary | ICD-10-CM

## 2015-01-18 LAB — POCT URINALYSIS DIPSTICK
BILIRUBIN UA: NEGATIVE
Glucose, UA: NEGATIVE
Ketones, UA: NEGATIVE
LEUKOCYTES UA: NEGATIVE
Nitrite, UA: NEGATIVE
PH UA: 7.5
Protein, UA: NEGATIVE
RBC UA: NEGATIVE
SPEC GRAV UA: 1.015
Urobilinogen, UA: 0.2

## 2015-01-18 LAB — LIPID PANEL
Cholesterol: 194 mg/dL (ref 0–200)
HDL: 41.7 mg/dL (ref >39.00)
LDL CALC: 125 mg/dL — AB (ref 0–99)
NONHDL: 152.3
Total CHOL/HDL Ratio: 5
Triglycerides: 139 mg/dL (ref 0.0–149.0)
VLDL: 27.8 mg/dL (ref 0.0–40.0)

## 2015-01-18 LAB — CBC WITH DIFFERENTIAL/PLATELET
BASOS ABS: 0 10*3/uL (ref 0.0–0.1)
Basophils Relative: 0.1 % (ref 0.0–3.0)
EOS ABS: 0.2 10*3/uL (ref 0.0–0.7)
Eosinophils Relative: 1.8 % (ref 0.0–5.0)
HEMATOCRIT: 47.1 % (ref 39.0–52.0)
HEMOGLOBIN: 16 g/dL (ref 13.0–17.0)
LYMPHS ABS: 0.9 10*3/uL (ref 0.7–4.0)
Lymphocytes Relative: 10.3 % — ABNORMAL LOW (ref 12.0–46.0)
MCHC: 33.9 g/dL (ref 30.0–36.0)
MCV: 90.1 fl (ref 78.0–100.0)
MONO ABS: 0.7 10*3/uL (ref 0.1–1.0)
MONOS PCT: 7.6 % (ref 3.0–12.0)
NEUTROS ABS: 7 10*3/uL (ref 1.4–7.7)
Neutrophils Relative %: 80.2 % — ABNORMAL HIGH (ref 43.0–77.0)
Platelets: 204 10*3/uL (ref 150.0–400.0)
RBC: 5.23 Mil/uL (ref 4.22–5.81)
RDW: 13.4 % (ref 11.5–15.5)
WBC: 8.7 10*3/uL (ref 4.0–10.5)

## 2015-01-18 LAB — COMPREHENSIVE METABOLIC PANEL
ALK PHOS: 60 U/L (ref 39–117)
ALT: 31 U/L (ref 0–53)
AST: 27 U/L (ref 0–37)
Albumin: 4.4 g/dL (ref 3.5–5.2)
BILIRUBIN TOTAL: 0.7 mg/dL (ref 0.2–1.2)
BUN: 10 mg/dL (ref 6–23)
CO2: 29 mEq/L (ref 19–32)
Calcium: 9.5 mg/dL (ref 8.4–10.5)
Chloride: 103 mEq/L (ref 96–112)
Creatinine, Ser: 1.07 mg/dL (ref 0.40–1.50)
GFR: 80.41 mL/min (ref >60.00)
GLUCOSE: 95 mg/dL (ref 70–99)
POTASSIUM: 3.7 meq/L (ref 3.5–5.1)
Sodium: 138 mEq/L (ref 135–145)
Total Protein: 7.4 g/dL (ref 6.0–8.3)

## 2015-01-18 LAB — TSH: TSH: 4.8 u[IU]/mL — AB (ref 0.35–4.50)

## 2015-01-25 ENCOUNTER — Encounter: Payer: Self-pay | Admitting: Family Medicine

## 2015-01-25 ENCOUNTER — Ambulatory Visit (INDEPENDENT_AMBULATORY_CARE_PROVIDER_SITE_OTHER): Payer: BC Managed Care – PPO | Admitting: Family Medicine

## 2015-01-25 VITALS — BP 120/88 | Temp 98.4°F | Ht 71.25 in | Wt 198.0 lb

## 2015-01-25 DIAGNOSIS — G47419 Narcolepsy without cataplexy: Secondary | ICD-10-CM

## 2015-01-25 DIAGNOSIS — Z0000 Encounter for general adult medical examination without abnormal findings: Secondary | ICD-10-CM | POA: Insufficient documentation

## 2015-01-25 MED ORDER — METHYLPHENIDATE HCL 10 MG PO TABS: ORAL_TABLET | ORAL | Status: DC

## 2015-01-25 NOTE — Patient Instructions (Signed)
Call the third week in May for refills on your medication  Return in one year for general physical exam sooner if any problems  [facility] Lamount CrankerBraccini to discuss when you should have screening colonoscopies

## 2015-01-25 NOTE — Progress Notes (Signed)
   Subjective:    Patient ID: Samuel Patterson, male    DOB: 11/19/1971, 44 y.o.   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 Therefore recommend he call his father's gastroenterologist Dr. Lamount CrankerBraccini to discuss screening colonoscopies   Review of Systems  Constitutional: Negative.   HENT: Negative.   Eyes: Negative.   Respiratory: Negative.   Cardiovascular: Negative.   Gastrointestinal: Negative.   Endocrine: Negative.   Genitourinary: Negative.   Musculoskeletal: Negative.   Skin: Negative.   Allergic/Immunologic: Negative.   Neurological: Negative.   Hematological: Negative.   Psychiatric/Behavioral: Negative.        Objective:   Physical Exam  Constitutional: He is oriented to person, place, and time. He appears well-developed and well-nourished.  HENT:  Head: Normocephalic and atraumatic.  Right Ear: External ear normal.  Left Ear: External ear normal.  Nose: Nose normal.  Mouth/Throat: Oropharynx is clear and moist.  Eyes: Conjunctivae and EOM are normal. Pupils are equal, round, and reactive to light.  Neck: Normal range of motion. Neck supple. No JVD present. No tracheal deviation present. No thyromegaly present.  Cardiovascular: Normal rate, regular rhythm, normal heart sounds and intact distal pulses.  Exam reveals no gallop and no friction rub.   No murmur heard. Pulmonary/Chest: Effort normal and breath sounds normal. No stridor. No respiratory distress. He has no wheezes. He has no rales. He exhibits no tenderness.  Abdominal: Soft. Bowel sounds are normal. He exhibits no distension and no mass. There is no  tenderness. There is no rebound and no guarding.  Genitourinary: Penis normal. No penile tenderness.  Musculoskeletal: Normal range of motion. He exhibits no edema or tenderness.  Lymphadenopathy:    He has no cervical adenopathy.  Neurological: He is alert and oriented to person, place, and time. He has normal reflexes. No cranial nerve deficit. He exhibits normal muscle tone.  Skin: Skin is warm and dry. No rash noted. No erythema. No pallor.  Psychiatric: He has a normal mood and affect. His behavior is normal. Judgment and thought content normal.  Nursing note and vitals reviewed.         Assessment & Plan:  Healthy male  History of narcolepsy continue Ritalin  Follow-up positive family history of colon polyps consult with you father's gastroenterologist.........Marland Kitchen

## 2015-01-25 NOTE — Progress Notes (Signed)
Pre visit review using our clinic review tool, if applicable. No additional management support is needed unless otherwise documented below in the visit note. 

## 2015-04-30 ENCOUNTER — Other Ambulatory Visit: Payer: Self-pay | Admitting: Family Medicine

## 2015-04-30 DIAGNOSIS — G47419 Narcolepsy without cataplexy: Secondary | ICD-10-CM

## 2015-04-30 NOTE — Telephone Encounter (Signed)
Pt request refill methylphenidate (RITALIN) 10 MG

## 2015-05-01 MED ORDER — METHYLPHENIDATE HCL 10 MG PO TABS: ORAL_TABLET | ORAL | Status: DC

## 2015-05-01 NOTE — Telephone Encounter (Signed)
Rx printed and given to Dr. Tawanna Coolerodd for signature.

## 2015-05-02 NOTE — Telephone Encounter (Signed)
Rx ready for pick up and patient is aware 

## 2015-10-16 ENCOUNTER — Other Ambulatory Visit: Payer: Self-pay | Admitting: *Deleted

## 2015-10-16 DIAGNOSIS — G47419 Narcolepsy without cataplexy: Secondary | ICD-10-CM

## 2015-10-16 MED ORDER — METHYLPHENIDATE HCL 10 MG PO TABS: ORAL_TABLET | ORAL | Status: DC

## 2016-03-28 ENCOUNTER — Telehealth: Payer: Self-pay | Admitting: Family Medicine

## 2016-03-28 DIAGNOSIS — G47419 Narcolepsy without cataplexy: Secondary | ICD-10-CM

## 2016-03-28 NOTE — Telephone Encounter (Signed)
° ° ° °  Pt request refill of the following: ° ° °methylphenidate (RITALIN) 10 MG tablet ° °Phamacy: °

## 2016-03-31 MED ORDER — METHYLPHENIDATE HCL 10 MG PO TABS: ORAL_TABLET | ORAL | Status: DC

## 2016-03-31 NOTE — Telephone Encounter (Signed)
rx ready for pick up and patient is aware  

## 2016-07-04 ENCOUNTER — Telehealth: Payer: Self-pay

## 2016-07-04 NOTE — Telephone Encounter (Signed)
Prior Authorization for methylphenidate (RITALIN) 10 MG tablet pending at his time  Key: [id number]

## 2016-07-08 ENCOUNTER — Telehealth: Payer: Self-pay

## 2016-07-08 NOTE — Telephone Encounter (Signed)
Pt following up on prior auth for his methylphenidate (RITALIN) 10 MG tablet

## 2016-07-08 NOTE — Telephone Encounter (Signed)
Spoke with patient and informed him that we had gotten prior authorization for his Ritalin. Patient calling the pharmacy to get his medication filled

## 2016-07-08 NOTE — Telephone Encounter (Signed)
Prior Authorization approval received for methylphenidate (RITALIN) 10 MG tablet   As long as you remain covered by the [facility] and there are no changes to your plan benefits, this request is approved for the following time period:  07/04/2016-07/05/2019

## 2016-08-11 ENCOUNTER — Other Ambulatory Visit (INDEPENDENT_AMBULATORY_CARE_PROVIDER_SITE_OTHER): Payer: BC Managed Care – PPO

## 2016-08-11 DIAGNOSIS — Z0000 Encounter for general adult medical examination without abnormal findings: Secondary | ICD-10-CM | POA: Diagnosis not present

## 2016-08-11 LAB — CBC WITH DIFFERENTIAL/PLATELET
Basophils Absolute: 0 10*3/uL (ref 0.0–0.1)
Basophils Relative: 0.5 % (ref 0.0–3.0)
Eosinophils Absolute: 0.1 10*3/uL (ref 0.0–0.7)
Eosinophils Relative: 2.2 % (ref 0.0–5.0)
HCT: 44.4 % (ref 39.0–52.0)
Hemoglobin: 15.3 g/dL (ref 13.0–17.0)
Lymphocytes Relative: 17.7 % (ref 12.0–46.0)
Lymphs Abs: 1 10*3/uL (ref 0.7–4.0)
MCHC: 34.4 g/dL (ref 30.0–36.0)
MCV: 89.5 fl (ref 78.0–100.0)
Monocytes Absolute: 0.4 10*3/uL (ref 0.1–1.0)
Monocytes Relative: 7.7 % (ref 3.0–12.0)
Neutro Abs: 4 10*3/uL (ref 1.4–7.7)
Neutrophils Relative %: 71.9 % (ref 43.0–77.0)
Platelets: 209 10*3/uL (ref 150.0–400.0)
RBC: 4.96 Mil/uL (ref 4.22–5.81)
RDW: 13.4 % (ref 11.5–15.5)
WBC: 5.6 10*3/uL (ref 4.0–10.5)

## 2016-08-11 LAB — BASIC METABOLIC PANEL
BUN: 12 mg/dL (ref 6–23)
CALCIUM: 9.3 mg/dL (ref 8.4–10.5)
CHLORIDE: 102 meq/L (ref 96–112)
CO2: 28 meq/L (ref 19–32)
CREATININE: 1 mg/dL (ref 0.40–1.50)
GFR: 86.3 mL/min (ref >60.00)
GLUCOSE: 102 mg/dL — AB (ref 70–99)
Potassium: 3.6 mEq/L (ref 3.5–5.1)
Sodium: 138 mEq/L (ref 135–145)

## 2016-08-11 LAB — POC URINALSYSI DIPSTICK (AUTOMATED)
BILIRUBIN UA: NEGATIVE
GLUCOSE UA: NEGATIVE
Ketones, UA: NEGATIVE
Leukocytes, UA: NEGATIVE
NITRITE UA: NEGATIVE
Protein, UA: NEGATIVE
Spec Grav, UA: 1.01
UROBILINOGEN UA: 0.2
pH, UA: 7

## 2016-08-11 LAB — HEPATIC FUNCTION PANEL
ALBUMIN: 4.4 g/dL (ref 3.5–5.2)
ALT: 17 U/L (ref 0–53)
AST: 18 U/L (ref 0–37)
Alkaline Phosphatase: 51 U/L (ref 39–117)
BILIRUBIN DIRECT: 0.1 mg/dL (ref 0.0–0.3)
TOTAL PROTEIN: 7.6 g/dL (ref 6.0–8.3)
Total Bilirubin: 0.7 mg/dL (ref 0.2–1.2)

## 2016-08-11 LAB — LIPID PANEL
CHOL/HDL RATIO: 4
Cholesterol: 176 mg/dL (ref 0–200)
HDL: 44.2 mg/dL (ref >39.00)
LDL Cholesterol: 111 mg/dL — ABNORMAL HIGH (ref 0–99)
NonHDL: 131.89
TRIGLYCERIDES: 102 mg/dL (ref 0.0–149.0)
VLDL: 20.4 mg/dL (ref 0.0–40.0)

## 2016-08-11 LAB — TSH: TSH: 7.29 u[IU]/mL — ABNORMAL HIGH (ref 0.35–4.50)

## 2016-08-18 ENCOUNTER — Encounter: Payer: Self-pay | Admitting: Family Medicine

## 2016-08-18 ENCOUNTER — Ambulatory Visit (INDEPENDENT_AMBULATORY_CARE_PROVIDER_SITE_OTHER): Payer: BC Managed Care – PPO | Admitting: Family Medicine

## 2016-08-18 VITALS — BP 128/80 | HR 87 | Temp 98.2°F

## 2016-08-18 DIAGNOSIS — Z0000 Encounter for general adult medical examination without abnormal findings: Secondary | ICD-10-CM

## 2016-08-18 DIAGNOSIS — G47419 Narcolepsy without cataplexy: Secondary | ICD-10-CM

## 2016-08-18 DIAGNOSIS — Z23 Encounter for immunization: Secondary | ICD-10-CM | POA: Diagnosis not present

## 2016-08-18 DIAGNOSIS — R7989 Other specified abnormal findings of blood chemistry: Secondary | ICD-10-CM | POA: Diagnosis not present

## 2016-08-18 LAB — TSH: TSH: 5.95 u[IU]/mL — ABNORMAL HIGH (ref 0.35–4.50)

## 2016-08-18 MED ORDER — METHYLPHENIDATE HCL 10 MG PO TABS: ORAL_TABLET | ORAL | 0 refills | Status: DC

## 2016-08-18 NOTE — Patient Instructions (Signed)
Continue current medication...........Marland Kitchen. remember to call 2 weeks and to your third prescription for refills  Your thyroid level was slightly abnormal. It's most likely a lab error. We will recheck it today.

## 2016-08-18 NOTE — Progress Notes (Signed)
Pre visit review using our clinic review tool, if applicable. No additional management support is needed unless otherwise documented below in the visit note. 

## 2016-08-18 NOTE — Progress Notes (Signed)
Samuel Patterson is a 45 year old married male nonsmoker who [place] [employer] [place] and also raises cows for [employer]........ he has for now......... who comes in today for general physical examination  He gets routine eye care, dental care, colonoscopy not due until age 45. His father had polyps. A consult with GI in the recommend he wait too age 45.  Vaccinations up-to-date tetanus booster 2013 seasonal flu shot given today  Medications Ritalin 10 mg twice a day long-term because of narcolepsy.  Review of systems otherwise negative  Physical examination vital signs stable he is afebrile HEENT were negative neck was supple no adenopathy thyroid normal cardiopulmonary normal abdominal exam normal extremities normal skin normal peripheral pulses normal  Impression #1 healthy male  #2 narcolepsy...Marland Kitchen.Marland Kitchen. continue Ritalin 10 mg twice a day  Abnormal thyroid.......Marland Kitchen. recheck labs

## 2016-08-20 ENCOUNTER — Other Ambulatory Visit (INDEPENDENT_AMBULATORY_CARE_PROVIDER_SITE_OTHER): Payer: BC Managed Care – PPO

## 2016-08-20 ENCOUNTER — Telehealth: Payer: Self-pay | Admitting: *Deleted

## 2016-08-20 DIAGNOSIS — R946 Abnormal results of thyroid function studies: Secondary | ICD-10-CM | POA: Diagnosis not present

## 2016-08-20 LAB — T4, FREE: FREE T4: 0.83 ng/dL (ref 0.60–1.60)

## 2016-08-20 LAB — T3, FREE: T3 FREE: 3.2 pg/mL (ref 2.3–4.2)

## 2016-08-20 NOTE — Telephone Encounter (Signed)
Spoke with Glynis SmilesSha in the lab and informed her of adding a free T4 as well as free T3. She verbalized understanding an informed me that it would be added.

## 2016-08-20 NOTE — Telephone Encounter (Signed)
-----   Message from Roderick PeeJeffrey A Todd, MD sent at 08/18/2016  3:33 PM EDT ----- Raoul PitchSierra,,,,,,,, please asked the lab to do a free T4 and a free T3 because of his abnormal TSH level

## 2016-12-30 ENCOUNTER — Telehealth: Payer: Self-pay | Admitting: Family Medicine

## 2016-12-30 DIAGNOSIS — G47419 Narcolepsy without cataplexy: Secondary | ICD-10-CM

## 2016-12-30 MED ORDER — METHYLPHENIDATE HCL 10 MG PO TABS: ORAL_TABLET | ORAL | 0 refills | Status: DC

## 2016-12-30 NOTE — Telephone Encounter (Signed)
Rx is printed and waiting for pt to pickup

## 2016-12-30 NOTE — Telephone Encounter (Signed)
° ° ° °  Pt request refill of the following: ° ° °methylphenidate (RITALIN) 10 MG tablet ° °Phamacy: °

## 2017-05-11 ENCOUNTER — Telehealth: Payer: Self-pay | Admitting: Family Medicine

## 2017-05-11 DIAGNOSIS — G47419 Narcolepsy without cataplexy: Secondary | ICD-10-CM

## 2017-05-11 MED ORDER — METHYLPHENIDATE HCL 10 MG PO TABS: ORAL_TABLET | ORAL | 0 refills | Status: DC

## 2017-05-11 NOTE — Telephone Encounter (Signed)
Ok to fill for 3 months?

## 2017-05-11 NOTE — Telephone Encounter (Signed)
Pt notified to pick up prescriptions at the front desk.

## 2017-05-11 NOTE — Telephone Encounter (Signed)
Okay for refill?  

## 2017-05-11 NOTE — Telephone Encounter (Signed)
Pt need new Rx for methylphenidate    Pt is aware of 3 business days for refills and someone will call when ready for pick up.   °

## 2017-05-11 NOTE — Telephone Encounter (Signed)
Printed for Dr. K to sign. 

## 2017-05-12 ENCOUNTER — Ambulatory Visit (INDEPENDENT_AMBULATORY_CARE_PROVIDER_SITE_OTHER): Payer: BC Managed Care – PPO | Admitting: Family Medicine

## 2017-05-12 ENCOUNTER — Encounter: Payer: Self-pay | Admitting: Family Medicine

## 2017-05-12 VITALS — BP 110/80 | HR 78 | Temp 98.2°F | Wt 201.4 lb

## 2017-05-12 DIAGNOSIS — J029 Acute pharyngitis, unspecified: Secondary | ICD-10-CM | POA: Diagnosis not present

## 2017-05-12 NOTE — Progress Notes (Signed)
Subjective:     Patient ID: Samuel Patterson, male   DOB: 11/06/1971, 46 y.o.   MRN: [id number]  HPI Patient seen with acute pharyngitis symptoms. Onset Sunday. He also has had some intermittent sore throat and body aches and over the past day developed some cough. He denied any document of fever. No chills. No night sweats. No nausea or vomiting. No skin rashes. No sick contacts. Minimal nasal congestion.  Past Medical History:  Diagnosis Date  . Allergy    vasomotor rhinitis  . Narcolepsy    No past surgical history on file.  reports that he has never smoked. He has never used smokeless tobacco. He reports that he drinks alcohol. He reports that he does not use drugs. family history is not on file. No Known Allergies   Review of Systems  Constitutional: Negative for chills and fever.  HENT: Positive for sore throat.   Respiratory: Positive for cough.   Neurological: Positive for headaches.       Objective:   Physical Exam  Constitutional: He appears well-developed and well-nourished.  HENT:  Right Ear: External ear normal.  Left Ear: External ear normal.  Minimal Posterior pharynx erythema. No exudate  Neck: Neck supple.  Cardiovascular: Normal rate and regular rhythm.   Pulmonary/Chest: Effort normal and breath sounds normal. No respiratory distress. He has no wheezes. He has no rales.  Lymphadenopathy:    He has no cervical adenopathy.       Assessment:     Acute pharyngitis. Suspect viral. Lack of fever, lack of anterior adenopathy, lack of exudate, etc. predict more likely viral source. Patient already improving    Plan:     -Continue symptomatic measures with Tylenol and/or ibuprofen and increased rest and fluids -Follow-up for any fever or other changes symptoms  Kristian CoveyBruce W Laithan Conchas MD [facility]

## 2017-05-12 NOTE — Patient Instructions (Signed)

## 2017-08-19 ENCOUNTER — Encounter: Payer: Self-pay | Admitting: Physician Assistant

## 2017-08-19 ENCOUNTER — Ambulatory Visit (INDEPENDENT_AMBULATORY_CARE_PROVIDER_SITE_OTHER): Payer: BC Managed Care – PPO | Admitting: Family Medicine

## 2017-08-19 ENCOUNTER — Encounter: Payer: Self-pay | Admitting: Family Medicine

## 2017-08-19 VITALS — BP 110/88 | HR 70 | Temp 98.1°F | Ht 71.0 in | Wt 204.0 lb

## 2017-08-19 DIAGNOSIS — Z0000 Encounter for general adult medical examination without abnormal findings: Secondary | ICD-10-CM | POA: Diagnosis not present

## 2017-08-19 DIAGNOSIS — G47419 Narcolepsy without cataplexy: Secondary | ICD-10-CM

## 2017-08-19 DIAGNOSIS — Z8371 Family history of colonic polyps: Secondary | ICD-10-CM

## 2017-08-19 DIAGNOSIS — Z23 Encounter for immunization: Secondary | ICD-10-CM

## 2017-08-19 LAB — LIPID PANEL
CHOLESTEROL: 196 mg/dL (ref 0–200)
HDL: 44.3 mg/dL (ref >39.00)
LDL Cholesterol: 134 mg/dL — ABNORMAL HIGH (ref 0–99)
NonHDL: 151.36
TRIGLYCERIDES: 85 mg/dL (ref 0.0–149.0)
Total CHOL/HDL Ratio: 4
VLDL: 17 mg/dL (ref 0.0–40.0)

## 2017-08-19 LAB — BASIC METABOLIC PANEL
BUN: 11 mg/dL (ref 6–23)
CO2: 29 mEq/L (ref 19–32)
Calcium: 9.2 mg/dL (ref 8.4–10.5)
Chloride: 103 mEq/L (ref 96–112)
Creatinine, Ser: 0.95 mg/dL (ref 0.40–1.50)
GFR: 91.14 mL/min (ref >60.00)
Glucose, Bld: 96 mg/dL (ref 70–99)
Potassium: 4 mEq/L (ref 3.5–5.1)
Sodium: 138 mEq/L (ref 135–145)

## 2017-08-19 LAB — CBC WITH DIFFERENTIAL/PLATELET
BASOS PCT: 0.9 % (ref 0.0–3.0)
Basophils Absolute: 0 10*3/uL (ref 0.0–0.1)
EOS PCT: 2 % (ref 0.0–5.0)
Eosinophils Absolute: 0.1 10*3/uL (ref 0.0–0.7)
HCT: 42.2 % (ref 39.0–52.0)
Hemoglobin: 14.3 g/dL (ref 13.0–17.0)
LYMPHS ABS: 1.1 10*3/uL (ref 0.7–4.0)
LYMPHS PCT: 23.8 % (ref 12.0–46.0)
MCHC: 33.8 g/dL (ref 30.0–36.0)
MCV: 91 fl (ref 78.0–100.0)
MONOS PCT: 7.5 % (ref 3.0–12.0)
Monocytes Absolute: 0.4 10*3/uL (ref 0.1–1.0)
NEUTROS ABS: 3.1 10*3/uL (ref 1.4–7.7)
NEUTROS PCT: 65.8 % (ref 43.0–77.0)
Platelets: 202 10*3/uL (ref 150.0–400.0)
RBC: 4.64 Mil/uL (ref 4.22–5.81)
RDW: 13.4 % (ref 11.5–15.5)
WBC: 4.8 10*3/uL (ref 4.0–10.5)

## 2017-08-19 LAB — HEPATIC FUNCTION PANEL
ALBUMIN: 4.4 g/dL (ref 3.5–5.2)
ALT: 18 U/L (ref 0–53)
AST: 18 U/L (ref 0–37)
Alkaline Phosphatase: 56 U/L (ref 39–117)
BILIRUBIN DIRECT: 0.1 mg/dL (ref 0.0–0.3)
TOTAL PROTEIN: 7.1 g/dL (ref 6.0–8.3)
Total Bilirubin: 0.5 mg/dL (ref 0.2–1.2)

## 2017-08-19 LAB — POCT URINALYSIS DIPSTICK
Bilirubin, UA: NEGATIVE
Blood, UA: NEGATIVE
Clarity, UA: NEGATIVE
Glucose, UA: NEGATIVE
KETONES UA: NEGATIVE
Leukocytes, UA: NEGATIVE
Nitrite, UA: NEGATIVE
PH UA: 6.5 (ref 5.0–8.0)
PROTEIN UA: NEGATIVE
SPEC GRAV UA: 1.01 (ref 1.010–1.025)
Urobilinogen, UA: 0.2 E.U./dL

## 2017-08-19 LAB — TSH: TSH: 4.1 u[IU]/mL (ref 0.35–4.50)

## 2017-08-19 MED ORDER — METHYLPHENIDATE HCL 10 MG PO TABS: ORAL_TABLET | ORAL | 0 refills | Status: DC

## 2017-08-19 MED ORDER — TRIAMCINOLONE ACETONIDE 0.5 % EX OINT: 1.0000 "application " | TOPICAL_OINTMENT | Freq: Two times a day (BID) | CUTANEOUS | 4 refills | Status: DC

## 2017-08-19 NOTE — Patient Instructions (Signed)
Continue current medications  Remember to send [place] a message on my chart 2 weeks from being out of your third prescription for refills  Labs today...Marland KitchenMarland KitchenMarland Kitchen I will call you if anything abnormal  I put in a request for a GI consult. They will call you and set up a time to talk to you about screening colonoscopy before each 50 because of your family history of colon polyps.  Return in one year for general physical exam sooner if any problems

## 2017-08-19 NOTE — Progress Notes (Signed)
Samuel Patterson is a 46 year old married male nonsmoker,,,,, works for the [employer] company,,,,, who comes in today for general physical examination because of a history of narcolepsy  He takes Ritalin 10 mg twice a day because of a history of narcolepsy.Marland Kitchen He's done well is had no complications from his medication  He gets routine eye care, dental care, colonoscopy ......... he tells me his father had colon polyps.,,,, I will refer him to GI for consult  Vaccinations up-to-date seasonal flu shot given today  14 point review of systems reviewed and otherwise negative  BP 110/88 (BP Location: Left Arm, Patient Position: Sitting, Cuff Size: Normal)   Pulse 70   Temp 98.1 F (36.7 C) (Oral)   Ht  (1.803 m)   Wt 204 lb (92.5 kg)   BMI 28.45 kg/m  Examination of the HEENT were negative neck was supple thyroid is nonenlarged cardiopulmonary exam normal abdominal exam normal extremities normal skin normal peripheral pulses normal except for scar on the medial side of his left thigh. States he fell out of a tree and cut that leg on [place] when he was a kid and required multiple stitches.  #1 healthy male  #2 history of narcolepsy......... continue Ritalin 10 mg twice a day check labs

## 2017-08-26 ENCOUNTER — Ambulatory Visit: Payer: BC Managed Care – PPO | Admitting: Family Medicine

## 2017-09-04 ENCOUNTER — Ambulatory Visit (INDEPENDENT_AMBULATORY_CARE_PROVIDER_SITE_OTHER): Payer: BC Managed Care – PPO | Admitting: Physician Assistant

## 2017-09-04 ENCOUNTER — Encounter: Payer: Self-pay | Admitting: Physician Assistant

## 2017-09-04 VITALS — BP 114/70 | HR 68 | Ht 71.0 in | Wt 204.2 lb

## 2017-09-04 DIAGNOSIS — Z8371 Family history of colonic polyps: Secondary | ICD-10-CM

## 2017-09-04 DIAGNOSIS — Z1211 Encounter for screening for malignant neoplasm of colon: Secondary | ICD-10-CM

## 2017-09-04 DIAGNOSIS — Z1212 Encounter for screening for malignant neoplasm of rectum: Secondary | ICD-10-CM

## 2017-09-04 NOTE — Patient Instructions (Signed)
You will be due for a recall colonoscopy in 2023. We will send you a reminder in the mail when it gets closer to that time.

## 2017-09-04 NOTE — Progress Notes (Addendum)
Chief Complaint: Screening for colorectal cancer, family history of colon polyps  HPI:  Mr. Samuel Patterson is a 46 year old Caucasian male with a past medical history as listed below, who was referred to me by Roderick Pee, MD for consult of a screening colonoscopy due to his family history of colon polyps .      Today, the patient explains that his father was diagnosed with colon polyps at time of his first colonoscopy in his 66s. He only had 2 polyps and these were not precancerous. Patient tells me that he did call his father's physician in order to find out if he needed early screening and he told him that he did not. No one else in the patient's family has had polyps and no one has been diagnosed with colon cancer.   Patient denies blood in his stool, change in bowel habits or weight loss.  Past Medical History:  Diagnosis Date  . Allergy    vasomotor rhinitis  . Narcolepsy     No past surgical history on file.  Current Outpatient Prescriptions  Medication Sig Dispense Refill  . Glucosamine-Chondroit-Vit C-Mn (GLUCOSAMINE CHONDR 500 COMPLEX PO) Take by mouth.    . methylphenidate (RITALIN) 10 MG tablet One p.o. B.i.d. 60 tablet 0  . triamcinolone ointment (KENALOG) 0.5 % Apply 1 application topically 2 (two) times daily. 45 g 4  . PROCTOZONE-HC 2.5 % rectal cream      No current facility-administered medications for this visit.     Allergies as of 09/04/2017  . (No Known Allergies)    Family History  Problem Relation Age of Onset  . Colon polyps Father     Social History   Social History  . Marital status: Married    Spouse name: N/A  . Number of children: N/A  . Years of education: N/A   Occupational History  . Not on file.   Social History Main Topics  . Smoking status: Never Smoker  . Smokeless tobacco: Never Used  . Alcohol use Yes  . Drug use: No  . Sexual activity: Not on file   Other Topics Concern  . Not on file   Social History Narrative  . No  narrative on file    Review of Systems:    Constitutional: No weight loss Skin: No rash  Cardiovascular: No chest pain Respiratory: No SOB  Gastrointestinal: See HPI and otherwise negative Genitourinary: No dysuria  Neurological: No headache Musculoskeletal: No new muscle or joint pain Hematologic: No bleeding  Psychiatric: No history of depression or anxiety   Physical Exam:  Vital signs: BP 114/70   Pulse 68   Ht  (1.803 m)   Wt 204 lb 3.2 oz (92.6 kg)   BMI 28.48 kg/m   Constitutional:   Pleasant Caucasian male appears to be in NAD, Well developed, Well nourished, alert and cooperative Respiratory: Respirations even and unlabored. Lungs clear to auscultation bilaterally.   No wheezes, crackles, or rhonchi.  Cardiovascular: Normal S1, S2. No MRG. Regular rate and rhythm. No peripheral edema, cyanosis or pallor.  Gastrointestinal:  Soft, nondistended, nontender. No rebound or guarding. Normal bowel sounds. No appreciable masses or hepatomegaly. Psychiatric:  Demonstrates good judgement and reason without abnormal affect or behaviors.  MOST RECENT LABS AND IMAGING: CBC    Component Value Date/Time   WBC 4.8 08/19/2017 1103   RBC 4.64 08/19/2017 1103   HGB 14.3 08/19/2017 1103   HCT 42.2 08/19/2017 1103   PLT 202.0 08/19/2017 1103  MCV 91.0 08/19/2017 1103   MCHC 33.8 08/19/2017 1103   RDW 13.4 08/19/2017 1103   LYMPHSABS 1.1 08/19/2017 1103   MONOABS 0.4 08/19/2017 1103   EOSABS 0.1 08/19/2017 1103   BASOSABS 0.0 08/19/2017 1103    CMP     Component Value Date/Time   NA 138 08/19/2017 1103   K 4.0 08/19/2017 1103   CL 103 08/19/2017 1103   CO2 29 08/19/2017 1103   GLUCOSE 96 08/19/2017 1103   BUN 11 08/19/2017 1103   CREATININE 0.95 08/19/2017 1103   CALCIUM 9.2 08/19/2017 1103   PROT 7.1 08/19/2017 1103   ALBUMIN 4.4 08/19/2017 1103   AST 18 08/19/2017 1103   ALT 18 08/19/2017 1103   ALKPHOS 56 08/19/2017 1103   BILITOT 0.5 08/19/2017 1103    GFRNONAA 89.12 03/05/2010 1018    Assessment: 1. Screening for colorectal cancer: Patient presents for consideration of a colonoscopy due to family history of colon polyps found in his father at the age of 32, 2 polyps, not advanced polyps per report, no other family history of colon cancer, no red flags 2. Family history of colon polyps  Plan: 1. Discussed with the patient that he is not in need of early screening. Did discuss this case with Dr. Leone Payor as well at the time of patient's appointment. He agrees that patient is not at increased risk for colorectal cancer. He will be due for a screening colonoscopy when he turns 51. 2. Patient was placed in recall for a colonoscopy in 5 years with Dr. Leone Payor 3. Patient may follow in clinic in the interim as needed  Hyacinth Meeker, Cordelia Poche [facility] 09/04/2017, 9:03 AM  Cc: Roderick Pee, MD   Agree with Ms. Lenard Simmer evaluation and management.  Iva Boop, MD, Clementeen Graham

## 2018-03-15 ENCOUNTER — Other Ambulatory Visit: Payer: Self-pay | Admitting: Family Medicine

## 2018-03-15 DIAGNOSIS — G47419 Narcolepsy without cataplexy: Secondary | ICD-10-CM

## 2018-03-16 ENCOUNTER — Other Ambulatory Visit: Payer: Self-pay | Admitting: Family Medicine

## 2018-03-16 DIAGNOSIS — G47419 Narcolepsy without cataplexy: Secondary | ICD-10-CM

## 2018-03-16 MED ORDER — METHYLPHENIDATE HCL 10 MG PO TABS
10.0000 mg | ORAL_TABLET | Freq: Two times a day (BID) | ORAL | 0 refills | Status: DC
Start: 2018-03-16 — End: 2018-08-23

## 2018-03-16 MED ORDER — METHYLPHENIDATE HCL 10 MG PO TABS: 10.0000 mg | ORAL_TABLET | Freq: Two times a day (BID) | ORAL | 0 refills | Status: DC

## 2018-03-16 MED ORDER — METHYLPHENIDATE HCL 10 MG PO TABS: ORAL_TABLET | ORAL | 0 refills | Status: DC

## 2018-03-16 NOTE — Telephone Encounter (Signed)
needs refill

## 2018-03-16 NOTE — Telephone Encounter (Signed)
Rx was refilled by Dr Tawanna Coolerodd

## 2018-04-27 ENCOUNTER — Telehealth: Payer: Self-pay | Admitting: Family Medicine

## 2018-04-27 NOTE — Telephone Encounter (Signed)
Copied from CRM [id number]. Topic: Quick Communication - Rx Refill/Question >> Apr 27, 2018  3:51 PM Mickel BaasMcGee, Byron Peacock B, NT wrote: Medication: methylphenidate (RITALIN) 10 MG tablet  Has the patient contacted their pharmacy? Yes.   (Agent: If no, request that the patient contact the pharmacy for the refill.) (Agent: If yes, when and what did the pharmacy advise?)  Preferred Pharmacy (with phone number or street name): [facility] - [place], [place] - [address]  Agent: Please be advised that RX refills may take up to 3 business days. We ask that you follow-up with your pharmacy.

## 2018-04-28 NOTE — Telephone Encounter (Signed)
Patient called and advised Ritalin was sent in on 03/16/18 to refill x 3 months, so another refill around 05/16/18 will be available at his pharmacy, he verbalized understanding.

## 2018-08-23 ENCOUNTER — Ambulatory Visit (INDEPENDENT_AMBULATORY_CARE_PROVIDER_SITE_OTHER): Payer: BC Managed Care – PPO | Admitting: Family Medicine

## 2018-08-23 ENCOUNTER — Encounter: Payer: Self-pay | Admitting: Family Medicine

## 2018-08-23 VITALS — BP 110/88 | HR 74 | Temp 98.4°F | Ht 70.0 in | Wt 211.3 lb

## 2018-08-23 DIAGNOSIS — G47419 Narcolepsy without cataplexy: Secondary | ICD-10-CM | POA: Diagnosis not present

## 2018-08-23 DIAGNOSIS — Z0000 Encounter for general adult medical examination without abnormal findings: Secondary | ICD-10-CM

## 2018-08-23 LAB — LDL CHOLESTEROL, DIRECT: Direct LDL: 105 mg/dL

## 2018-08-23 LAB — HEPATIC FUNCTION PANEL
ALT: 18 U/L (ref 0–53)
AST: 18 U/L (ref 0–37)
Albumin: 4.6 g/dL (ref 3.5–5.2)
Alkaline Phosphatase: 62 U/L (ref 39–117)
BILIRUBIN TOTAL: 0.4 mg/dL (ref 0.2–1.2)
Bilirubin, Direct: 0.1 mg/dL (ref 0.0–0.3)
Total Protein: 7.5 g/dL (ref 6.0–8.3)

## 2018-08-23 LAB — POCT URINALYSIS DIPSTICK
BILIRUBIN UA: NEGATIVE
Blood, UA: NEGATIVE
Glucose, UA: NEGATIVE
KETONES UA: NEGATIVE
Leukocytes, UA: NEGATIVE
Nitrite, UA: NEGATIVE
Protein, UA: NEGATIVE
Spec Grav, UA: 1.015 (ref 1.010–1.025)
Urobilinogen, UA: 0.2 E.U./dL
pH, UA: 6 (ref 5.0–8.0)

## 2018-08-23 LAB — BASIC METABOLIC PANEL
BUN: 9 mg/dL (ref 6–23)
CO2: 31 meq/L (ref 19–32)
CREATININE: 1 mg/dL (ref 0.40–1.50)
Calcium: 10.2 mg/dL (ref 8.4–10.5)
Chloride: 101 mEq/L (ref 96–112)
GFR: 85.51 mL/min (ref >60.00)
GLUCOSE: 90 mg/dL (ref 70–99)
Potassium: 4.4 mEq/L (ref 3.5–5.1)
Sodium: 139 mEq/L (ref 135–145)

## 2018-08-23 LAB — CBC WITH DIFFERENTIAL/PLATELET
BASOS PCT: 0.9 % (ref 0.0–3.0)
Basophils Absolute: 0.1 10*3/uL (ref 0.0–0.1)
EOS PCT: 2.1 % (ref 0.0–5.0)
Eosinophils Absolute: 0.1 10*3/uL (ref 0.0–0.7)
HEMATOCRIT: 45.7 % (ref 39.0–52.0)
HEMOGLOBIN: 15.6 g/dL (ref 13.0–17.0)
Lymphocytes Relative: 19.7 % (ref 12.0–46.0)
Lymphs Abs: 1.4 10*3/uL (ref 0.7–4.0)
MCHC: 34.1 g/dL (ref 30.0–36.0)
MCV: 88.7 fl (ref 78.0–100.0)
MONO ABS: 0.5 10*3/uL (ref 0.1–1.0)
Monocytes Relative: 7.8 % (ref 3.0–12.0)
Neutro Abs: 4.9 10*3/uL (ref 1.4–7.7)
Neutrophils Relative %: 69.5 % (ref 43.0–77.0)
Platelets: 219 10*3/uL (ref 150.0–400.0)
RBC: 5.15 Mil/uL (ref 4.22–5.81)
RDW: 13.7 % (ref 11.5–15.5)
WBC: 7.1 10*3/uL (ref 4.0–10.5)

## 2018-08-23 LAB — LIPID PANEL
CHOL/HDL RATIO: 4
Cholesterol: 187 mg/dL (ref 0–200)
HDL: 43.7 mg/dL (ref >39.00)
NONHDL: 143.25
Triglycerides: 219 mg/dL — ABNORMAL HIGH (ref 0.0–149.0)
VLDL: 43.8 mg/dL — ABNORMAL HIGH (ref 0.0–40.0)

## 2018-08-23 LAB — TSH: TSH: 7.47 u[IU]/mL — ABNORMAL HIGH (ref 0.35–4.50)

## 2018-08-23 MED ORDER — METHYLPHENIDATE HCL 10 MG PO TABS: ORAL_TABLET | ORAL | 0 refills | Status: DC

## 2018-08-23 MED ORDER — METHYLPHENIDATE HCL 10 MG PO TABS: 10.0000 mg | ORAL_TABLET | Freq: Two times a day (BID) | ORAL | 0 refills | Status: DC

## 2018-08-23 NOTE — Patient Instructions (Signed)
Labs today...........Marland Kitchen we will call if there is anything abnormal  Continue current meds  Return in 1 year for general checkup sooner if any problems  Our new nurse practitioner Tawana Scale

## 2018-08-23 NOTE — Progress Notes (Signed)
Samuel Patterson is a 47 year old married male non-smoker who comes in today for general physical examination because of a history of narcolepsy  He is currently on Ritalin 10 mg twice daily doing well no complications  He gets routine eye care, dental care, no need for colonoscopy or PSA.  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  Cardiopulmonary exam normal.  Abdominal exam normal.  Extremities normal skin normal peripheral pulses normal.  Impression #1 healthy male  2.  History of narcolepsy,,,,,,,,,, continue Ritalin 10 mg twice daily

## 2018-08-31 ENCOUNTER — Other Ambulatory Visit (INDEPENDENT_AMBULATORY_CARE_PROVIDER_SITE_OTHER): Payer: BC Managed Care – PPO

## 2018-08-31 DIAGNOSIS — E039 Hypothyroidism, unspecified: Secondary | ICD-10-CM | POA: Diagnosis not present

## 2018-08-31 LAB — TSH: TSH: 6.39 u[IU]/mL — ABNORMAL HIGH (ref 0.35–4.50)

## 2018-09-01 ENCOUNTER — Other Ambulatory Visit (INDEPENDENT_AMBULATORY_CARE_PROVIDER_SITE_OTHER): Payer: BC Managed Care – PPO

## 2018-09-01 DIAGNOSIS — E039 Hypothyroidism, unspecified: Secondary | ICD-10-CM | POA: Diagnosis not present

## 2018-09-01 LAB — T3, FREE: T3, Free: 3.2 pg/mL (ref 2.3–4.2)

## 2018-09-01 LAB — T4, FREE: Free T4: 0.96 ng/dL (ref 0.60–1.60)

## 2018-12-10 ENCOUNTER — Ambulatory Visit: Payer: BC Managed Care – PPO | Admitting: Family Medicine

## 2018-12-10 ENCOUNTER — Encounter: Payer: Self-pay | Admitting: Family Medicine

## 2018-12-10 DIAGNOSIS — G47419 Narcolepsy without cataplexy: Secondary | ICD-10-CM | POA: Diagnosis not present

## 2018-12-10 NOTE — Progress Notes (Signed)
Samuel GingerMichael S Patterson - 48 y.o. male MRN [id number]  Date of birth: 12/21/1970  Subjective Chief Complaint  Patient presents with  . Establish Care    denies health concerns    HPI Samuel Patterson is a 48 y.o. male with history of narcolepsy here today to establish with new pcp.  He had annual exam in 07/2018 and has no new concerns today.  He has history of narcolepsy that is treated with ritalin 10mg  BID.  This works well for him and he denies side effects from medication including chest pain, shortness of breath, palpitations.   ROS:  A comprehensive ROS was completed and negative except as noted per HPI  No Known Allergies  Past Medical History:  Diagnosis Date  . Allergy    vasomotor rhinitis  . Narcolepsy     No past surgical history on file.  Social History   Socioeconomic History  . Marital status: Married    Spouse name: Not on file  . Number of children: Not on file  . Years of education: Not on file  . Highest education level: Not on file  Occupational History  . Not on file  Social Needs  . Financial resource strain: Not on file  . Food insecurity:    Worry: Not on file    Inability: Not on file  . Transportation needs:    Medical: Not on file    Non-medical: Not on file  Tobacco Use  . Smoking status: Never Smoker  . Smokeless tobacco: Never Used  Substance and Sexual Activity  . Alcohol use: Yes  . Drug use: No  . Sexual activity: Not on file  Lifestyle  . Physical activity:    Days per week: Not on file    Minutes per session: Not on file  . Stress: Not on file  Relationships  . Social connections:    Talks on phone: Not on file    Gets together: Not on file    Attends religious service: Not on file    Active member of club or organization: Not on file    Attends meetings of clubs or organizations: Not on file    Relationship status: Not on file  Other Topics Concern  . Not on file  Social History Narrative  . Not on file    Family History    Problem Relation Age of Onset  . Colon polyps Father     Health Maintenance  Topic Date Due  . HIV Screening  09/02/1987  . TETANUS/TDAP  09/13/2022  . INFLUENZA VACCINE  Completed    ----------------------------------------------------------------------------------------------------------------------------------------------------------------------------------------------------------------- Physical Exam BP 132/74   Pulse 72   Temp 97.9 F (36.6 C) (Oral)   Ht 5\' 10"  (1.778 m)   Wt 213 lb (96.6 kg)   SpO2 98%   BMI 30.56 kg/m   Physical Exam Constitutional:      Appearance: Normal appearance.  HENT:     Head: Normocephalic and atraumatic.  Eyes:     General: No scleral icterus. Neck:     Musculoskeletal: Neck supple.  Cardiovascular:     Rate and Rhythm: Normal rate and regular rhythm.  Pulmonary:     Effort: Pulmonary effort is normal.     Breath sounds: Normal breath sounds.  Lymphadenopathy:     Cervical: No cervical adenopathy.  Skin:    General: Skin is warm and dry.     Findings: No rash.  Neurological:     General: No focal deficit present.  Mental Status: He is alert.  Psychiatric:        Mood and Affect: Mood normal.        Behavior: Behavior normal.     ------------------------------------------------------------------------------------------------------------------------------------------------------------------------------------------------------------------- Assessment and Plan  Narcolepsy without cataplexy -Ritalin continues to work well for him without side effects.  -Does not need refill at this time, will call when needing.  -Will return in September for annual exam.

## 2018-12-10 NOTE — Assessment & Plan Note (Signed)
-  Ritalin continues to work well for him without side effects.  -Does not need refill at this time, will call when needing.  -Will return in September for annual exam.

## 2019-02-01 ENCOUNTER — Telehealth: Payer: Self-pay | Admitting: Family Medicine

## 2019-02-01 NOTE — Telephone Encounter (Signed)
Medication not delegated to [facility] to refill

## 2019-02-01 NOTE — Telephone Encounter (Signed)
Copied from CRM [id number]. Topic: Quick Communication - Rx Refill/Question >> Feb 01, 2019 11:45 AM Jaquita Rector A wrote: Medication: methylphenidate (RITALIN) 10 MG tablet   Has the patient contacted their pharmacy? Yes.   (Agent: If no, request that the patient contact the pharmacy for the refill.) (Agent: If yes, when and what did the pharmacy advise?)  Preferred Pharmacy (with phone number or street name): [facility] - [place], [place] - [address] [phone] (Phone) [phone] (Fax)    Agent: Please be advised that RX refills may take up to 3 business days. We ask that you follow-up with your pharmacy.

## 2019-02-01 NOTE — Telephone Encounter (Signed)
Dr. Ashley Royalty please advise on refill request  Last filled by Dr. Tawanna Cooler 09/19 qty #60 Last ov 12/10/18, not due for f/u until Sept for PE

## 2019-02-02 ENCOUNTER — Other Ambulatory Visit: Payer: Self-pay | Admitting: Family Medicine

## 2019-02-02 DIAGNOSIS — G47419 Narcolepsy without cataplexy: Secondary | ICD-10-CM

## 2019-02-02 MED ORDER — METHYLPHENIDATE HCL 10 MG PO TABS: 10.0000 mg | ORAL_TABLET | Freq: Two times a day (BID) | ORAL | 0 refills | Status: DC

## 2019-02-02 NOTE — Telephone Encounter (Signed)
Spoke with pt and informed him of message from CM. Pt understood and had no additional questions at this time.

## 2019-02-02 NOTE — Telephone Encounter (Signed)
Rx completed

## 2019-04-19 ENCOUNTER — Other Ambulatory Visit: Payer: Self-pay | Admitting: Family Medicine

## 2019-04-19 DIAGNOSIS — G47419 Narcolepsy without cataplexy: Secondary | ICD-10-CM

## 2019-07-05 ENCOUNTER — Other Ambulatory Visit: Payer: Self-pay | Admitting: Family Medicine

## 2019-07-05 DIAGNOSIS — G47419 Narcolepsy without cataplexy: Secondary | ICD-10-CM

## 2019-07-06 ENCOUNTER — Telehealth: Payer: Self-pay

## 2019-07-06 NOTE — Telephone Encounter (Signed)
PA started (Key: [id number]) waiting for response

## 2019-07-07 NOTE — Telephone Encounter (Signed)
PA approved.

## 2019-08-23 ENCOUNTER — Other Ambulatory Visit: Payer: Self-pay | Admitting: Family Medicine

## 2019-08-23 DIAGNOSIS — G47419 Narcolepsy without cataplexy: Secondary | ICD-10-CM

## 2019-08-26 ENCOUNTER — Telehealth: Payer: Self-pay

## 2019-08-26 NOTE — Telephone Encounter (Signed)
Questions for Screening COVID-19  Symptom onset: none  Travel or Contacts: none  During this illness, did/does the patient experience any of the following symptoms? Fever >100.78F []   Yes [x]   No []   Unknown Subjective fever (felt feverish) []   Yes [x]   No []   Unknown Chills []   Yes [x]   No []   Unknown Muscle aches (myalgia) []   Yes [x]   No []   Unknown Runny nose (rhinorrhea) []   Yes [x]   No []   Unknown Sore throat []   Yes [x]   No []   Unknown Cough (new onset or worsening of chronic cough) []   Yes [x]   No []   Unknown Shortness of breath (dyspnea) []   Yes [x]   No []   Unknown Nausea or vomiting []   Yes [x]   No []   Unknown Headache []   Yes []   No [x]   Unknown Abdominal pain  []   Yes [x]   No []   Unknown Diarrhea (?3 loose/looser than normal stools/24hr period) []   Yes []   No [x]   Unknown Other, specify:

## 2019-08-29 ENCOUNTER — Encounter: Payer: Self-pay | Admitting: Family Medicine

## 2019-08-29 ENCOUNTER — Ambulatory Visit (INDEPENDENT_AMBULATORY_CARE_PROVIDER_SITE_OTHER): Payer: BC Managed Care – PPO | Admitting: Family Medicine

## 2019-08-29 ENCOUNTER — Other Ambulatory Visit: Payer: Self-pay

## 2019-08-29 VITALS — BP 114/80 | HR 74 | Temp 98.0°F | Resp 16 | Ht 70.0 in | Wt 207.8 lb

## 2019-08-29 DIAGNOSIS — Z13220 Encounter for screening for lipoid disorders: Secondary | ICD-10-CM | POA: Diagnosis not present

## 2019-08-29 DIAGNOSIS — Z23 Encounter for immunization: Secondary | ICD-10-CM

## 2019-08-29 DIAGNOSIS — G47419 Narcolepsy without cataplexy: Secondary | ICD-10-CM | POA: Diagnosis not present

## 2019-08-29 DIAGNOSIS — Z0000 Encounter for general adult medical examination without abnormal findings: Secondary | ICD-10-CM

## 2019-08-29 DIAGNOSIS — Z79899 Other long term (current) drug therapy: Secondary | ICD-10-CM

## 2019-08-29 LAB — CBC WITH DIFFERENTIAL/PLATELET
Basophils Absolute: 0 10*3/uL (ref 0.0–0.1)
Basophils Relative: 0.7 % (ref 0.0–3.0)
Eosinophils Absolute: 0.1 10*3/uL (ref 0.0–0.7)
Eosinophils Relative: 0.8 % (ref 0.0–5.0)
HCT: 44.8 % (ref 39.0–52.0)
Hemoglobin: 15.1 g/dL (ref 13.0–17.0)
Lymphocytes Relative: 14.9 % (ref 12.0–46.0)
Lymphs Abs: 1 10*3/uL (ref 0.7–4.0)
MCHC: 33.8 g/dL (ref 30.0–36.0)
MCV: 90 fl (ref 78.0–100.0)
Monocytes Absolute: 0.5 10*3/uL (ref 0.1–1.0)
Monocytes Relative: 7 % (ref 3.0–12.0)
Neutro Abs: 5.1 10*3/uL (ref 1.4–7.7)
Neutrophils Relative %: 76.6 % (ref 43.0–77.0)
Platelets: 215 10*3/uL (ref 150.0–400.0)
RBC: 4.97 Mil/uL (ref 4.22–5.81)
RDW: 14 % (ref 11.5–15.5)
WBC: 6.6 10*3/uL (ref 4.0–10.5)

## 2019-08-29 LAB — COMPREHENSIVE METABOLIC PANEL
ALT: 25 U/L (ref 0–53)
AST: 18 U/L (ref 0–37)
Albumin: 4.7 g/dL (ref 3.5–5.2)
Alkaline Phosphatase: 71 U/L (ref 39–117)
BUN: 11 mg/dL (ref 6–23)
CO2: 27 mEq/L (ref 19–32)
Calcium: 9.8 mg/dL (ref 8.4–10.5)
Chloride: 102 mEq/L (ref 96–112)
Creatinine, Ser: 0.95 mg/dL (ref 0.40–1.50)
GFR: 84.98 mL/min (ref >60.00)
Glucose, Bld: 98 mg/dL (ref 70–99)
Potassium: 4 mEq/L (ref 3.5–5.1)
Sodium: 138 mEq/L (ref 135–145)
Total Bilirubin: 0.4 mg/dL (ref 0.2–1.2)
Total Protein: 7.5 g/dL (ref 6.0–8.3)

## 2019-08-29 LAB — TSH: TSH: 4.17 u[IU]/mL (ref 0.35–4.50)

## 2019-08-29 LAB — LIPID PANEL
Cholesterol: 190 mg/dL (ref 0–200)
HDL: 42.1 mg/dL (ref >39.00)
LDL Cholesterol: 112 mg/dL — ABNORMAL HIGH (ref 0–99)
NonHDL: 147.64
Total CHOL/HDL Ratio: 5
Triglycerides: 178 mg/dL — ABNORMAL HIGH (ref 0.0–149.0)
VLDL: 35.6 mg/dL (ref 0.0–40.0)

## 2019-08-29 NOTE — Progress Notes (Signed)
Samuel Patterson - 48 y.o. male MRN [id number]  Date of birth: [birth year]  Subjective Chief Complaint  Patient presents with  . Annual Exam  . Flu Vaccine    today    HPI Samuel Patterson is a 48 y.o. male with history of narcolepsy here today for annual exam.  He reports he is doing well.  Methylphenidate continues to work well for his narcolepsy.  He is a non-smoker and consumes EtOH occasionally.  He exercises a few times per week and follows a fairly healthy diet.  He has no additional concerns today.   Review of Systems  Constitutional: Negative for chills, fever, malaise/fatigue and weight loss.  HENT: Negative for congestion, ear pain and sore throat.   Eyes: Negative for blurred vision, double vision and pain.  Respiratory: Negative for cough and shortness of breath.   Cardiovascular: Negative for chest pain and palpitations.  Gastrointestinal: Negative for abdominal pain, blood in stool, constipation, heartburn and nausea.  Genitourinary: Negative for dysuria and urgency.  Musculoskeletal: Negative for joint pain and myalgias.  Neurological: Negative for dizziness and headaches.  Endo/Heme/Allergies: Does not bruise/bleed easily.  Psychiatric/Behavioral: Negative for depression. The patient is not nervous/anxious and does not have insomnia.     No Known Allergies  Past Medical History:  Diagnosis Date  . Allergy    vasomotor rhinitis  . Narcolepsy     No past surgical history on file.  Social History   Socioeconomic History  . Marital status: Married    Spouse name: Not on file  . Number of children: Not on file  . Years of education: Not on file  . Highest education level: Not on file  Occupational History  . Not on file  Social Needs  . Financial resource strain: Not on file  . Food insecurity    Worry: Not on file    Inability: Not on file  . Transportation needs    Medical: Not on file    Non-medical: Not on file  Tobacco Use  . Smoking status:  Never Smoker  . Smokeless tobacco: Never Used  Substance and Sexual Activity  . Alcohol use: Yes  . Drug use: No  . Sexual activity: Not on file  Lifestyle  . Physical activity    Days per week: Not on file    Minutes per session: Not on file  . Stress: Not on file  Relationships  . Social Herbalist on phone: Not on file    Gets together: Not on file    Attends religious service: Not on file    Active member of club or organization: Not on file    Attends meetings of clubs or organizations: Not on file    Relationship status: Not on file  Other Topics Concern  . Not on file  Social History Narrative  . Not on file    Family History  Problem Relation Age of Onset  . Colon polyps Father     Health Maintenance  Topic Date Due  . HIV Screening  09/02/1987  . INFLUENZA VACCINE  06/25/2019  . TETANUS/TDAP  09/13/2022    ----------------------------------------------------------------------------------------------------------------------------------------------------------------------------------------------------------------- Physical Exam BP 114/80 (BP Location: Left Arm)   Pulse 74   Temp 98 F (36.7 C)   Resp 16   Ht 5\' 10"  (1.778 m)   Wt 207 lb 12.8 oz (94.3 kg)   SpO2 99%   BMI 29.82 kg/m   Physical Exam Constitutional:  General: He is not in acute distress. HENT:     Head: Normocephalic and atraumatic.     Right Ear: External ear normal.     Left Ear: External ear normal.     Mouth/Throat:     Mouth: Mucous membranes are moist.  Eyes:     General: No scleral icterus. Neck:     Musculoskeletal: Normal range of motion and neck supple.     Thyroid: No thyromegaly.  Cardiovascular:     Rate and Rhythm: Normal rate and regular rhythm.     Pulses: Normal pulses.     Heart sounds: Normal heart sounds.  Pulmonary:     Effort: Pulmonary effort is normal.     Breath sounds: Normal breath sounds.  Abdominal:     General: Bowel sounds are  normal. There is no distension.     Palpations: Abdomen is soft.     Tenderness: There is no abdominal tenderness. There is no guarding.  Musculoskeletal:     Right lower leg: No edema.     Left lower leg: No edema.  Lymphadenopathy:     Cervical: No cervical adenopathy.  Skin:    General: Skin is warm and dry.     Findings: No rash.  Neurological:     General: No focal deficit present.     Mental Status: He is alert and oriented to person, place, and time.     Cranial Nerves: No cranial nerve deficit.     Motor: No abnormal muscle tone.  Psychiatric:        Mood and Affect: Mood normal.        Behavior: Behavior normal.     ------------------------------------------------------------------------------------------------------------------------------------------------------------------------------------------------------------------- Assessment and Plan  No problem-specific Assessment & Plan notes found for this encounter.

## 2019-08-29 NOTE — Assessment & Plan Note (Signed)
Well adult Orders Placed This Encounter  Procedures  . Flu Vaccine QUAD 36+ mos IM (Fluarix & Fluzone Quad PF  . Comp Met (CMET)  . CBC w/Diff  . Lipid Profile  . TSH  . ToxASSURE Select 13 (MW), Urine  Immunizations: flu vaccine Screenings:  Lipid panel Anticipatory guidance/Risk factor reduction:  Per AVS

## 2019-08-29 NOTE — Patient Instructions (Signed)
Preventive Care 40-47 Years Old, Male Preventive care refers to lifestyle choices and visits with your health care provider that can promote health and wellness. This includes:  A yearly physical exam. This is also called an annual well check.  Regular dental and eye exams.  Immunizations.  Screening for certain conditions.  Healthy lifestyle choices, such as eating a healthy diet, getting regular exercise, not using drugs or products that contain nicotine and tobacco, and limiting alcohol use. What can I expect for my preventive care visit? Physical exam Your health care provider will check:  Height and weight. These may be used to calculate body mass index (BMI), which is a measurement that tells if you are at a healthy weight.  Heart rate and blood pressure.  Your skin for abnormal spots. Counseling Your health care provider may ask you questions about:  Alcohol, tobacco, and drug use.  Emotional well-being.  Home and relationship well-being.  Sexual activity.  Eating habits.  Work and work environment. What immunizations do I need?  Influenza (flu) vaccine  This is recommended every year. Tetanus, diphtheria, and pertussis (Tdap) vaccine  You may need a Td booster every 10 years. Varicella (chickenpox) vaccine  You may need this vaccine if you have not already been vaccinated. Zoster (shingles) vaccine  You may need this after age 60. Measles, mumps, and rubella (MMR) vaccine  You may need at least one dose of MMR if you were born in 1957 or later. You may also need a second dose. Pneumococcal conjugate (PCV13) vaccine  You may need this if you have certain conditions and were not previously vaccinated. Pneumococcal polysaccharide (PPSV23) vaccine  You may need one or two doses if you smoke cigarettes or if you have certain conditions. Meningococcal conjugate (MenACWY) vaccine  You may need this if you have certain conditions. Hepatitis A vaccine   You may need this if you have certain conditions or if you travel or work in places where you may be exposed to hepatitis A. Hepatitis B vaccine  You may need this if you have certain conditions or if you travel or work in places where you may be exposed to hepatitis B. Haemophilus influenzae type b (Hib) vaccine  You may need this if you have certain risk factors. Human papillomavirus (HPV) vaccine  If recommended by your health care provider, you may need three doses over 6 months. You may receive vaccines as individual doses or as more than one vaccine together in one shot (combination vaccines). Talk with your health care provider about the risks and benefits of combination vaccines. What tests do I need? Blood tests  Lipid and cholesterol levels. These may be checked every 5 years, or more frequently if you are over 50 years old.  Hepatitis C test.  Hepatitis B test. Screening  Lung cancer screening. You may have this screening every year starting at age 55 if you have a 30-pack-year history of smoking and currently smoke or have quit within the past 15 years.  Prostate cancer screening. Recommendations will vary depending on your family history and other risks.  Colorectal cancer screening. All adults should have this screening starting at age 50 and continuing until age 75. Your health care provider may recommend screening at age 45 if you are at increased risk. You will have tests every 1-10 years, depending on your results and the type of screening test.  Diabetes screening. This is done by checking your blood sugar (glucose) after you have not eaten   for a while (fasting). You may have this done every 1-3 years.  Sexually transmitted disease (STD) testing. Follow these instructions at home: Eating and drinking  Eat a diet that includes fresh fruits and vegetables, whole grains, lean protein, and low-fat dairy products.  Take vitamin and mineral supplements as recommended  by your health care provider.  Do not drink alcohol if your health care provider tells you not to drink.  If you drink alcohol: ? Limit how much you have to 0-2 drinks a day. ? Be aware of how much alcohol is in your drink. In the [place] one drink equals one 12 oz bottle of beer (355 mL), one 5 oz glass of wine (148 mL), or one 1 oz glass of hard liquor (44 mL). Lifestyle  Take daily care of your teeth and gums.  Stay active. Exercise for at least 30 minutes on 5 or more days each week.  Do not use any products that contain nicotine or tobacco, such as cigarettes, e-cigarettes, and chewing tobacco. If you need help quitting, ask your health care provider.  If you are sexually active, practice safe sex. Use a condom or other form of protection to prevent STIs (sexually transmitted infections).  Talk with your health care provider about taking a low-dose aspirin every day starting at age 33. What's next?  Go to your health care provider once a year for a well check visit.  Ask your health care provider how often you should have your eyes and teeth checked.  Stay up to date on all vaccines. This information is not intended to replace advice given to you by your health care provider. Make sure you discuss any questions you have with your health care provider. Document Released: 12/07/2015 Document Revised: 11/04/2018 Document Reviewed: 11/04/2018 Elsevier Patient Education  2020 [employer].

## 2019-08-29 NOTE — Assessment & Plan Note (Signed)
Stable with current dose of methylphenidate.  Update UDS and PDMP reviewed today.

## 2019-09-06 LAB — TOXASSURE SELECT 13 (MW), URINE

## 2019-10-03 ENCOUNTER — Other Ambulatory Visit: Payer: Self-pay

## 2019-10-03 DIAGNOSIS — Z20822 Contact with and (suspected) exposure to covid-19: Secondary | ICD-10-CM

## 2019-10-05 LAB — NOVEL CORONAVIRUS, NAA: SARS-CoV-2, NAA: DETECTED — AB

## 2019-10-29 ENCOUNTER — Other Ambulatory Visit: Payer: Self-pay | Admitting: Family Medicine

## 2019-10-29 DIAGNOSIS — G47419 Narcolepsy without cataplexy: Secondary | ICD-10-CM

## 2019-12-16 ENCOUNTER — Other Ambulatory Visit: Payer: Self-pay | Admitting: Family Medicine

## 2019-12-16 DIAGNOSIS — G47419 Narcolepsy without cataplexy: Secondary | ICD-10-CM

## 2020-01-02 ENCOUNTER — Ambulatory Visit: Payer: BC Managed Care – PPO | Attending: Internal Medicine

## 2020-01-02 DIAGNOSIS — Z20822 Contact with and (suspected) exposure to covid-19: Secondary | ICD-10-CM

## 2020-01-03 LAB — NOVEL CORONAVIRUS, NAA: SARS-CoV-2, NAA: NOT DETECTED

## 2020-02-27 ENCOUNTER — Encounter: Payer: Self-pay | Admitting: Family Medicine

## 2020-02-27 ENCOUNTER — Ambulatory Visit (INDEPENDENT_AMBULATORY_CARE_PROVIDER_SITE_OTHER): Payer: BC Managed Care – PPO | Admitting: Family Medicine

## 2020-02-27 ENCOUNTER — Ambulatory Visit: Payer: BC Managed Care – PPO | Admitting: Family Medicine

## 2020-02-27 ENCOUNTER — Other Ambulatory Visit: Payer: Self-pay

## 2020-02-27 VITALS — BP 124/78 | HR 78 | Temp 97.5°F | Ht 70.0 in | Wt 213.4 lb

## 2020-02-27 DIAGNOSIS — Z79899 Other long term (current) drug therapy: Secondary | ICD-10-CM

## 2020-02-27 DIAGNOSIS — G47419 Narcolepsy without cataplexy: Secondary | ICD-10-CM

## 2020-02-27 MED ORDER — METHYLPHENIDATE HCL 10 MG PO TABS: 10.0000 mg | ORAL_TABLET | Freq: Two times a day (BID) | ORAL | 0 refills | Status: DC

## 2020-02-27 NOTE — Patient Instructions (Addendum)
Narcolepsy Narcolepsy is a neurological disorder that causes people to fall asleep suddenly and without control (have sleep attacks) during the daytime. It is a lifelong disorder. Narcolepsy disrupts the sleep cycle at night, which then causes daytime sleepiness. What are the causes? The cause of narcolepsy is not fully understood, but it may be related to:  Low levels of hypocretin, a chemical (neurotransmitter) in the brain that controls sleep and wake cycles. Hypocretin imbalance may be caused by: ? Abnormal genes that are passed from parent to child (inherited). ? An autoimmune disease in which the body's defense system (immune system) attacks the brain cells that make hypocretin.  Infection, tumor, or injury in the area of the brain that controls sleep.  Exposure to poisons (toxins), such as heavy metals, pesticides, and secondhand smoke. What are the signs or symptoms? Symptoms of this condition include:  Excessive daytime sleepiness. This is the most common symptom and is usually the first symptom you will notice. This may affect your performance at work or school.  Sleep attacks. You may fall asleep in the middle of an activity, especially low-energy activities like reading or watching TV.  Feeling like you cannot think clearly and trouble focusing or remembering things. You may also feel depressed.  Sudden muscle weakness (cataplexy). When this occurs, your speech may become slurred, or your knees may buckle. Cataplexy is usually triggered by surprise, anger, fear, or laughter.  Losing the ability to speak or move (sleep paralysis). This may occur just as you start to fall asleep or wake up. You will be aware of the paralysis. It usually lasts for just a few seconds or minutes.  Seeing, hearing, tasting, smelling, or feeling things that are not real (hallucinations). Hallucinations may occur with sleep paralysis. They can happen when you are falling asleep, waking up, or  dozing.  Trouble staying asleep at night (insomnia) and restless sleep. How is this diagnosed? This condition may be diagnosed based on:  A physical exam to rule out any other problems that may be causing your symptoms. You may be asked to write down your sleeping patterns for several weeks in a sleep diary. This will help your health care provider make a diagnosis.  Sleep studies that measure how well your REM sleep is regulated. These tests also measure your heart rate, breathing, movement, and brain waves. These tests include: ? An overnight sleep study (polysomnogram). ? A daytime sleep study that is done while you take several naps during the day (multiple sleep latency test, MSLT). This test measures how quickly you fall asleep and how quickly you enter REM sleep.  Removal of spinal fluid to measure hypocretin levels. How is this treated? There is no cure for this condition, but treatment can help relieve symptoms. Treatment may include:  Lifestyle and sleeping strategies to help you cope with the condition, such as: ? Exercising regularly. ? Maintaining a regular sleep schedule. ? Avoiding caffeine and large meals before bed.  Medicines. These may include: ? Medicines that help keep you awake and alert (stimulants) to fight daytime sleepiness. ? Medicines that treat depression (antidepressants). These may be used to treat cataplexy. ? Sodium oxybate. This is a strong medicine to help you relax (sedative) that you may take at night. It can help control daytime sleepiness and cataplexy.  Other treatments may include mental health counseling or joining a support group. Follow these instructions at home: Sleeping habits   Get about 8 hours of sleep every night.  Go to   sleep and get up at about the same time every day.  Keep your bedroom dark, quiet, and comfortable.  When you feel very tired, take short naps. Schedule naps so that you take them at about the same time every  day.  Before bedtime: ? Avoid bright lights and screens. ? Relax. Try activities like reading or taking a warm bath. Activity  Get at least 20 minutes of exercise every day. This will help you sleep better at night and reduce daytime sleepiness.  Avoid exercising within 3 hours of bedtime.  Do not drive or use heavy machinery if you are sleepy. If possible, take a nap before driving.  Do not swim or go out on the water without a life jacket. Eating and drinking  Do not drink alcohol or caffeinated beverages within 4-5 hours of bedtime.  Do not eat a large meal before bedtime.  Eat meals at about the same times every day. General instructions   Take over-the-counter and prescription medicines only as told by your health care provider.  Keep a sleep diary as told by your health care provider.  Tell your employer or teachers that you have narcolepsy. You may be able to adjust your schedule to include time for naps.  Do not use any products that contain nicotine or tobacco, such as cigarettes, e-cigarettes, and chewing tobacco. If you need help quitting, ask your health care provider.  Keep all follow-up visits as told by your health care provider. This is important. Where to find more information  National Institute of Neurological Disorders: www.ninds.nih.gov Contact a health care provider if:  Your symptoms are not getting better.  You have increasingly high blood pressure (hypertension).  You have changes in your heart rhythm.  You are having a hard time determining what is real and what is not (psychosis). Get help right away if you:  Hurt yourself during a sleep attack or an attack of cataplexy.  Have chest pain.  Have trouble breathing. These symptoms may represent a serious problem that is an emergency. Do not wait to see if the symptoms will go away. Get medical help right away. Call your local emergency services (911 in the [place] Do not drive yourself to the  hospital. Summary  Narcolepsy is a neurological disorder that causes people to fall asleep suddenly, and without control, during the daytime (sleep attacks). It is a lifelong disorder.  There is no cure for this condition, but treatment can help relieve symptoms.  Go to sleep and get up at about the same time every day. Follow instructions about sleep and activities as told by your health care provider.  Take over-the-counter and prescription medicines only as told by your health care provider. This information is not intended to replace advice given to you by your health care provider. Make sure you discuss any questions you have with your health care provider. Document Revised: 06/22/2019 Document Reviewed: 06/22/2019 Elsevier Patient Education  2020 [employer]  

## 2020-02-27 NOTE — Progress Notes (Signed)
Established Patient Office Visit  Subjective:  Patient ID: Samuel Patterson, male    DOB: 05-22-1971  Age: 49 y.o. MRN: [id number]  CC:  Chief Complaint  Patient presents with  . Transitions Of Care    6 month follow up, no concerns     HPI Samuel Patterson presents for follow-up of his narcolepsy.  Takes Ritalin 10 mg twice daily and this regimen has worked well for him.  He lives at home with his wife and steps daughter.  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  He is quite active on the farm.  Did receive the Covid vaccine.  Is receiving regular dental care.  Consumes 2 beers daily and that is it.  He does not smoke or use illicit drugs.  Past Medical History:  Diagnosis Date  . Allergy    vasomotor rhinitis  . Narcolepsy     History reviewed. No pertinent surgical history.  Family History  Problem Relation Age of Onset  . Colon polyps Father     Social History   Socioeconomic History  . Marital status: Married    Spouse name: Not on file  . Number of children: Not on file  . Years of education: Not on file  . Highest education level: Not on file  Occupational History  . Not on file  Tobacco Use  . Smoking status: Never Smoker  . Smokeless tobacco: Never Used  Substance and Sexual Activity  . Alcohol use: Yes    Comment: 2 beers daily  . Drug use: No  . Sexual activity: Not on file  Other Topics Concern  . Not on file  Social History Narrative  . Not on file   Social Determinants of Health   Financial Resource Strain:   . Difficulty of Paying Living Expenses:   Food Insecurity:   . Worried About Programme researcher, broadcasting/film/video in the Last Year:   . Barista in the Last Year:   Transportation Needs:   . Freight forwarder (Medical):   Marland Kitchen Lack of Transportation (Non-Medical):   Physical Activity:   . Days of Exercise per Week:   . Minutes of Exercise per Session:   Stress:   . Feeling of Stress :     Social Connections:   . Frequency of Communication with Friends and Family:   . Frequency of Social Gatherings with Friends and Family:   . Attends Religious Services:   . Active Member of Clubs or Organizations:   . Attends Banker Meetings:   Marland Kitchen Marital Status:   Intimate Partner Violence:   . Fear of Current or Ex-Partner:   . Emotionally Abused:   Marland Kitchen Physically Abused:   . Sexually Abused:     Outpatient Medications Prior to Visit  Medication Sig Dispense Refill  . Glucosamine-Chondroit-Vit C-Mn (GLUCOSAMINE CHONDR 500 COMPLEX PO) Take by mouth.    . methylphenidate (RITALIN) 10 MG tablet TAKE 1 TABLET BY MOUTH 2 TIMES DAILY. 60 tablet 0   No facility-administered medications prior to visit.    No Known Allergies  ROS Review of Systems  Constitutional: Negative.   HENT: Negative.   Eyes: Negative for photophobia and visual disturbance.  Respiratory: Negative.   Cardiovascular: Negative.   Gastrointestinal: Negative.   Endocrine: Negative for polyphagia and polyuria.  Genitourinary: Negative for difficulty urinating.  Musculoskeletal: Negative for gait problem and joint swelling.  Neurological: Negative.   Hematological:  Does not bruise/bleed easily.  Psychiatric/Behavioral: Negative for agitation, behavioral problems, confusion and dysphoric mood.      Objective:    Physical Exam  Constitutional: He is oriented to person, place, and time. He appears well-developed and well-nourished. No distress.  HENT:  Head: Normocephalic and atraumatic.  Right Ear: External ear normal.  Left Ear: External ear normal.  Eyes: Conjunctivae are normal. Right eye exhibits no discharge. Left eye exhibits no discharge. No scleral icterus.  Neck: No JVD present. No tracheal deviation present.  Cardiovascular: Normal rate, regular rhythm and normal heart sounds.  Pulmonary/Chest: Effort normal and breath sounds normal. No stridor.  Musculoskeletal:        General: No  edema.  Neurological: He is alert and oriented to person, place, and time.  Skin: Skin is warm and dry. He is not diaphoretic.  Psychiatric: He has a normal mood and affect. His behavior is normal.   Depression screen [facility] 2/9 02/27/2020 02/27/2020 08/29/2019  Decreased Interest 0 0 0  Down, Depressed, Hopeless 0 0 0  PHQ - 2 Score 0 0 0  Altered sleeping 0 - -  Tired, decreased energy 0 - -  Change in appetite 1 - -  Feeling bad or failure about yourself  0 - -  Trouble concentrating 0 - -  Moving slowly or fidgety/restless 0 - -  Suicidal thoughts 0 - -  PHQ-9 Score 1 - -  Difficult doing work/chores Not difficult at all - -    BP 124/78   Pulse 78   Temp (!) 97.5 F (36.4 C) (Tympanic)   Ht 5\' 10"  (1.778 m)   Wt 213 lb 6.4 oz (96.8 kg)   SpO2 97%   BMI 30.62 kg/m  Wt Readings from Last 3 Encounters:  02/27/20 213 lb 6.4 oz (96.8 kg)  08/29/19 207 lb 12.8 oz (94.3 kg)  12/10/18 213 lb (96.6 kg)     Health Maintenance Due  Topic Date Due  . HIV Screening  Never done    There are no preventive care reminders to display for this patient.  Lab Results  Component Value Date   TSH 4.17 08/29/2019   Lab Results  Component Value Date   WBC 6.6 08/29/2019   HGB 15.1 08/29/2019   HCT 44.8 08/29/2019   MCV 90.0 08/29/2019   PLT 215.0 08/29/2019   Lab Results  Component Value Date   NA 138 08/29/2019   K 4.0 08/29/2019   CO2 27 08/29/2019   GLUCOSE 98 08/29/2019   BUN 11 08/29/2019   CREATININE 0.95 08/29/2019   BILITOT 0.4 08/29/2019   ALKPHOS 71 08/29/2019   AST 18 08/29/2019   ALT 25 08/29/2019   PROT 7.5 08/29/2019   ALBUMIN 4.7 08/29/2019   CALCIUM 9.8 08/29/2019   GFR 84.98 08/29/2019   Lab Results  Component Value Date   CHOL 190 08/29/2019   Lab Results  Component Value Date   HDL 42.10 08/29/2019   Lab Results  Component Value Date   LDLCALC 112 (H) 08/29/2019   Lab Results  Component Value Date   TRIG 178.0 (H) 08/29/2019   Lab Results   Component Value Date   CHOLHDL 5 08/29/2019   No results found for: HGBA1C    Assessment & Plan:   Problem List Items Addressed This Visit      Other   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 methylphenidate (RITALIN) 10 MG tablet    Sig: Take 1 tablet (10 mg total) by mouth 2 (two) times daily.    Dispense:  60 tablet    Refill:  0    Fill now.  . methylphenidate (RITALIN) 10 MG tablet    Sig: Take 1 tablet (10 mg total) by mouth 2 (two) times daily.    Dispense:  60 tablet    Refill:  0    Fill in 30 days.  . methylphenidate (RITALIN) 10 MG tablet    Sig: Take 1 tablet (10 mg total) by mouth 2 (two) times daily with breakfast and lunch.    Dispense:  60 tablet    Refill:  0    Fill in 60 days  . methylphenidate (RITALIN) 10 MG tablet    Sig: Take 1 tablet (10 mg total) by mouth 2 (two) times daily.    Dispense:  60 tablet    Refill:  0    Fill in 90 days.  . methylphenidate (RITALIN) 10 MG tablet    Sig: Take 1 tablet (10 mg total) by mouth 2 (two) times daily.    Dispense:  60 tablet    Refill:  0    Fill in 120 days.  . methylphenidate (RITALIN) 10 MG tablet    Sig: Take 1 tablet (10 mg total) by mouth 2 (two) times daily.    Dispense:  60 tablet    Refill:  0    Fill in 150 days.    Follow-up: Return in about 6 months (around 08/28/2020), or Return in September for a physical..   Will return in September for his physical and then again in October for follow-up of his narcolepsy.  He does understand that these will be separate visits.  He was given information on narcolepsy. Libby Maw, MD

## 2020-08-24 ENCOUNTER — Telehealth: Payer: Self-pay | Admitting: Family Medicine

## 2020-08-24 NOTE — Telephone Encounter (Signed)
Patient would like to [facility] from Dr. Doreene Burke to Dr. Barron Alvine. Please let me know if this is approved and I will call to schedule him.

## 2020-08-26 NOTE — Telephone Encounter (Signed)
Okay 

## 2020-08-27 ENCOUNTER — Encounter: Payer: BC Managed Care – PPO | Admitting: Family Medicine

## 2020-08-28 NOTE — Telephone Encounter (Signed)
ok 

## 2020-10-01 ENCOUNTER — Encounter: Payer: BC Managed Care – PPO | Admitting: Family Medicine

## 2020-11-27 ENCOUNTER — Other Ambulatory Visit: Payer: Self-pay | Admitting: Family Medicine

## 2020-11-27 DIAGNOSIS — G47419 Narcolepsy without cataplexy: Secondary | ICD-10-CM

## 2020-11-27 NOTE — Telephone Encounter (Signed)
Refill request for pending medication. Last OV with Dr. Doreene Burke 02/27/20 patient has upcoming TOC visit scheduled with Dr. Salena Saner. Please advise.

## 2021-01-22 ENCOUNTER — Other Ambulatory Visit: Payer: Self-pay

## 2021-01-23 ENCOUNTER — Ambulatory Visit (INDEPENDENT_AMBULATORY_CARE_PROVIDER_SITE_OTHER): Payer: BC Managed Care – PPO | Admitting: Family Medicine

## 2021-01-23 ENCOUNTER — Encounter: Payer: Self-pay | Admitting: Family Medicine

## 2021-01-23 VITALS — BP 136/88 | HR 82 | Temp 97.8°F | Ht 71.0 in | Wt 222.8 lb

## 2021-01-23 DIAGNOSIS — Z0000 Encounter for general adult medical examination without abnormal findings: Secondary | ICD-10-CM | POA: Diagnosis not present

## 2021-01-23 DIAGNOSIS — Z13220 Encounter for screening for lipoid disorders: Secondary | ICD-10-CM

## 2021-01-23 DIAGNOSIS — G47419 Narcolepsy without cataplexy: Secondary | ICD-10-CM | POA: Diagnosis not present

## 2021-01-23 DIAGNOSIS — Z1211 Encounter for screening for malignant neoplasm of colon: Secondary | ICD-10-CM

## 2021-01-23 DIAGNOSIS — Z79899 Other long term (current) drug therapy: Secondary | ICD-10-CM

## 2021-01-23 DIAGNOSIS — Z2821 Immunization not carried out because of patient refusal: Secondary | ICD-10-CM

## 2021-01-23 LAB — BASIC METABOLIC PANEL
BUN: 11 mg/dL (ref 6–23)
CO2: 31 mEq/L (ref 19–32)
Calcium: 9.5 mg/dL (ref 8.4–10.5)
Chloride: 102 mEq/L (ref 96–112)
Creatinine, Ser: 0.95 mg/dL (ref 0.40–1.50)
GFR: 94.73 mL/min (ref >60.00)
Glucose, Bld: 103 mg/dL — ABNORMAL HIGH (ref 70–99)
Potassium: 4.1 mEq/L (ref 3.5–5.1)
Sodium: 138 mEq/L (ref 135–145)

## 2021-01-23 LAB — LIPID PANEL
Cholesterol: 194 mg/dL (ref 0–200)
HDL: 42.5 mg/dL (ref >39.00)
NonHDL: 151.92
Total CHOL/HDL Ratio: 5
Triglycerides: 210 mg/dL — ABNORMAL HIGH (ref 0.0–149.0)
VLDL: 42 mg/dL — ABNORMAL HIGH (ref 0.0–40.0)

## 2021-01-23 LAB — CBC
HCT: 46.1 % (ref 39.0–52.0)
Hemoglobin: 15.8 g/dL (ref 13.0–17.0)
MCHC: 34.3 g/dL (ref 30.0–36.0)
MCV: 88.5 fl (ref 78.0–100.0)
Platelets: 214 10*3/uL (ref 150.0–400.0)
RBC: 5.21 Mil/uL (ref 4.22–5.81)
RDW: 13.6 % (ref 11.5–15.5)
WBC: 6.5 10*3/uL (ref 4.0–10.5)

## 2021-01-23 LAB — LDL CHOLESTEROL, DIRECT: Direct LDL: 105 mg/dL

## 2021-01-23 LAB — ALT: ALT: 39 U/L (ref 0–53)

## 2021-01-23 LAB — AST: AST: 23 U/L (ref 0–37)

## 2021-01-23 NOTE — Patient Instructions (Signed)
Health Maintenance, Male Adopting a healthy lifestyle and getting preventive care are important in promoting health and wellness. Ask your health care provider about:  The right schedule for you to have regular tests and exams.  Things you can do on your own to prevent diseases and keep yourself healthy. What should I know about diet, weight, and exercise? Eat a healthy diet  Eat a diet that includes plenty of vegetables, fruits, low-fat dairy products, and lean protein.  Do not eat a lot of foods that are high in solid fats, added sugars, or sodium.   Maintain a healthy weight Body mass index (BMI) is a measurement that can be used to identify possible weight problems. It estimates body fat based on height and weight. Your health care provider can help determine your BMI and help you achieve or maintain a healthy weight. Get regular exercise Get regular exercise. This is one of the most important things you can do for your health. Most adults should:  Exercise for at least 150 minutes each week. The exercise should increase your heart rate and make you sweat (moderate-intensity exercise).  Do strengthening exercises at least twice a week. This is in addition to the moderate-intensity exercise.  Spend less time sitting. Even light physical activity can be beneficial. Watch cholesterol and blood lipids Have your blood tested for lipids and cholesterol at 49 years of age, then have this test every 5 years. You may need to have your cholesterol levels checked more often if:  Your lipid or cholesterol levels are high.  You are older than 49 years of age.  You are at high risk for heart disease. What should I know about cancer screening? Many types of cancers can be detected early and may often be prevented. Depending on your health history and family history, you may need to have cancer screening at various ages. This may include screening for:  Colorectal cancer.  Prostate  cancer.  Skin cancer.  Lung cancer. What should I know about heart disease, diabetes, and high blood pressure? Blood pressure and heart disease  High blood pressure causes heart disease and increases the risk of stroke. This is more likely to develop in people who have high blood pressure readings, are of African descent, or are overweight.  Talk with your health care provider about your target blood pressure readings.  Have your blood pressure checked: ? Every 3-5 years if you are 55-22 years of age. ? Every year if you are 41 years old or older.  If you are between the ages of 16 and 56 and are a current or former smoker, ask your health care provider if you should have a one-time screening for abdominal aortic aneurysm (AAA). Diabetes Have regular diabetes screenings. This checks your fasting blood sugar level. Have the screening done:  Once every three years after age 70 if you are at a normal weight and have a low risk for diabetes.  More often and at a younger age if you are overweight or have a high risk for diabetes. What should I know about preventing infection? Hepatitis B If you have a higher risk for hepatitis B, you should be screened for this virus. Talk with your health care provider to find out if you are at risk for hepatitis B infection. Hepatitis C Blood testing is recommended for:  Everyone born from [birth year] through 1964.  Anyone with known risk factors for hepatitis C. Sexually transmitted infections (STIs)  You should be screened  each year for STIs, including gonorrhea and chlamydia, if: ? You are sexually active and are younger than 49 years of age. ? You are older than 49 years of age and your health care provider tells you that you are at risk for this type of infection. ? Your sexual activity has changed since you were last screened, and you are at increased risk for chlamydia or gonorrhea. Ask your health care provider if you are at risk.  Ask your  health care provider about whether you are at high risk for HIV. Your health care provider may recommend a prescription medicine to help prevent HIV infection. If you choose to take medicine to prevent HIV, you should first get tested for HIV. You should then be tested every 3 months for as long as you are taking the medicine. Follow these instructions at home: Lifestyle  Do not use any products that contain nicotine or tobacco, such as cigarettes, e-cigarettes, and chewing tobacco. If you need help quitting, ask your health care provider.  Do not use street drugs.  Do not share needles.  Ask your health care provider for help if you need support or information about quitting drugs. Alcohol use  Do not drink alcohol if your health care provider tells you not to drink.  If you drink alcohol: ? Limit how much you have to 0-2 drinks a day. ? Be aware of how much alcohol is in your drink. In the [place] one drink equals one 12 oz bottle of beer (355 mL), one 5 oz glass of wine (148 mL), or one 1 oz glass of hard liquor (44 mL). General instructions  Schedule regular health, dental, and eye exams.  Stay current with your vaccines.  Tell your health care provider if: ? You often feel depressed. ? You have ever been abused or do not feel safe at home. Summary  Adopting a healthy lifestyle and getting preventive care are important in promoting health and wellness.  Follow your health care provider's instructions about healthy diet, exercising, and getting tested or screened for diseases.  Follow your health care provider's instructions on monitoring your cholesterol and blood pressure. This information is not intended to replace advice given to you by your health care provider. Make sure you discuss any questions you have with your health care provider. Document Revised: 11/03/2018 Document Reviewed: 11/03/2018 Elsevier Patient Education  2021 Elsevier  Inc.   https://www.mata.com/.pdf">  DASH Eating Plan DASH stands for Dietary Approaches to Stop Hypertension. The DASH eating plan is a healthy eating plan that has been shown to:  Reduce high blood pressure (hypertension).  Reduce your risk for type 2 diabetes, heart disease, and stroke.  Help with weight loss. What are tips for following this plan? Reading food labels  Check food labels for the amount of salt (sodium) per serving. Choose foods with less than 5 percent of the Daily Value of sodium. Generally, foods with less than 300 milligrams (mg) of sodium per serving fit into this eating plan.  To find whole grains, look for the word "whole" as the first word in the ingredient list. Shopping  Buy products labeled as "low-sodium" or "no salt added."  Buy fresh foods. Avoid canned foods and pre-made or frozen meals. Cooking  Avoid adding salt when cooking. Use salt-free seasonings or herbs instead of table salt or sea salt. Check with your health care provider or pharmacist before using salt substitutes.  Do not fry foods. Cook foods using healthy methods such  as baking, boiling, grilling, roasting, and broiling instead.  Cook with heart-healthy oils, such as olive, canola, avocado, soybean, or sunflower oil. Meal planning  Eat a balanced diet that includes: ? 4 or more servings of fruits and 4 or more servings of vegetables each day. Try to fill one-half of your plate with fruits and vegetables. ? 6-8 servings of whole grains each day. ? Less than 6 oz (170 g) of lean meat, poultry, or fish each day. A 3-oz (85-g) serving of meat is about the same size as a deck of cards. One egg equals 1 oz (28 g). ? 2-3 servings of low-fat dairy each day. One serving is 1 cup (237 mL). ? 1 serving of nuts, seeds, or beans 5 times each week. ? 2-3 servings of heart-healthy fats. Healthy fats called omega-3 fatty acids are found in foods such as  walnuts, flaxseeds, fortified milks, and eggs. These fats are also found in cold-water fish, such as sardines, salmon, and mackerel.  Limit how much you eat of: ? Canned or prepackaged foods. ? Food that is high in trans fat, such as some fried foods. ? Food that is high in saturated fat, such as fatty meat. ? Desserts and other sweets, sugary drinks, and other foods with added sugar. ? Full-fat dairy products.  Do not salt foods before eating.  Do not eat more than 4 egg yolks a week.  Try to eat at least 2 vegetarian meals a week.  Eat more home-cooked food and less restaurant, buffet, and fast food.   Lifestyle  When eating at a restaurant, ask that your food be prepared with less salt or no salt, if possible.  If you drink alcohol: ? Limit how much you use to:  0-1 drink a day for women who are not pregnant.  0-2 drinks a day for men. ? Be aware of how much alcohol is in your drink. In the [place] one drink equals one 12 oz bottle of beer (355 mL), one 5 oz glass of wine (148 mL), or one 1 oz glass of hard liquor (44 mL). General information  Avoid eating more than 2,300 mg of salt a day. If you have hypertension, you may need to reduce your sodium intake to 1,500 mg a day.  Work with your health care provider to maintain a healthy body weight or to lose weight. Ask what an ideal weight is for you.  Get at least 30 minutes of exercise that causes your heart to beat faster (aerobic exercise) most days of the week. Activities may include walking, swimming, or biking.  Work with your health care provider or dietitian to adjust your eating plan to your individual calorie needs. What foods should I eat? Fruits All fresh, dried, or frozen fruit. Canned fruit in natural juice (without added sugar). Vegetables Fresh or frozen vegetables (raw, steamed, roasted, or grilled). Low-sodium or reduced-sodium tomato and vegetable juice. Low-sodium or reduced-sodium tomato sauce and tomato  paste. Low-sodium or reduced-sodium canned vegetables. Grains Whole-grain or whole-wheat bread. Whole-grain or whole-wheat pasta. Brown rice. Orpah Cobb. Bulgur. Whole-grain and low-sodium cereals. Pita bread. Low-fat, low-sodium crackers. Whole-wheat flour tortillas. Meats and other proteins Skinless chicken or [place]. Ground chicken or [place]. Pork with fat trimmed off. Fish and seafood. Egg whites. Dried beans, peas, or lentils. Unsalted nuts, nut butters, and seeds. Unsalted canned beans. Lean cuts of beef with fat trimmed off. Low-sodium, lean precooked or cured meat, such as sausages or meat loaves. Dairy Low-fat (1%)  or fat-free (skim) milk. Reduced-fat, low-fat, or fat-free cheeses. Nonfat, low-sodium ricotta or cottage cheese. Low-fat or nonfat yogurt. Low-fat, low-sodium cheese. Fats and oils Soft margarine without trans fats. Vegetable oil. Reduced-fat, low-fat, or light mayonnaise and salad dressings (reduced-sodium). Canola, safflower, olive, avocado, soybean, and sunflower oils. Avocado. Seasonings and condiments Herbs. Spices. Seasoning mixes without salt. Other foods Unsalted popcorn and pretzels. Fat-free sweets. The items listed above may not be a complete list of foods and beverages you can eat. Contact a dietitian for more information. What foods should I avoid? Fruits Canned fruit in a light or heavy syrup. Fried fruit. Fruit in cream or butter sauce. Vegetables Creamed or fried vegetables. Vegetables in a cheese sauce. Regular canned vegetables (not low-sodium or reduced-sodium). Regular canned tomato sauce and paste (not low-sodium or reduced-sodium). Regular tomato and vegetable juice (not low-sodium or reduced-sodium). Rosita Fire. Olives. Grains Baked goods made with fat, such as croissants, muffins, or some breads. Dry pasta or rice meal packs. Meats and other proteins Fatty cuts of meat. Ribs. Fried meat. Tomasa Blase. Bologna, salami, and other precooked or cured meats,  such as sausages or meat loaves. Fat from the back of a pig (fatback). Bratwurst. Salted nuts and seeds. Canned beans with added salt. Canned or smoked fish. Whole eggs or egg yolks. Chicken or [place] with skin. Dairy Whole or 2% milk, cream, and half-and-half. Whole or full-fat cream cheese. Whole-fat or sweetened yogurt. Full-fat cheese. Nondairy creamers. Whipped toppings. Processed cheese and cheese spreads. Fats and oils Butter. Stick margarine. Lard. Shortening. Ghee. Bacon fat. Tropical oils, such as coconut, palm kernel, or palm oil. Seasonings and condiments Onion salt, garlic salt, seasoned salt, table salt, and sea salt. Worcestershire sauce. Tartar sauce. Barbecue sauce. Teriyaki sauce. Soy sauce, including reduced-sodium. Steak sauce. Canned and packaged gravies. Fish sauce. Oyster sauce. Cocktail sauce. Store-bought horseradish. Ketchup. Mustard. Meat flavorings and tenderizers. Bouillon cubes. Hot sauces. Pre-made or packaged marinades. Pre-made or packaged taco seasonings. Relishes. Regular salad dressings. Other foods Salted popcorn and pretzels. The items listed above may not be a complete list of foods and beverages you should avoid. Contact a dietitian for more information. Where to find more information  National Heart, Lung, and Blood Institute: PopSteam.is  American Heart Association: www.heart.org  Academy of Nutrition and Dietetics: www.eatright.org  National Kidney Foundation: www.kidney.org Summary  The DASH eating plan is a healthy eating plan that has been shown to reduce high blood pressure (hypertension). It may also reduce your risk for type 2 diabetes, heart disease, and stroke.  When on the DASH eating plan, aim to eat more fresh fruits and vegetables, whole grains, lean proteins, low-fat dairy, and heart-healthy fats.  With the DASH eating plan, you should limit salt (sodium) intake to 2,300 mg a day. If you have hypertension, you may need to reduce  your sodium intake to 1,500 mg a day.  Work with your health care provider or dietitian to adjust your eating plan to your individual calorie needs. This information is not intended to replace advice given to you by your health care provider. Make sure you discuss any questions you have with your health care provider. Document Revised: 10/14/2019 Document Reviewed: 10/14/2019 Elsevier Patient Education  2021 [employer].

## 2021-01-23 NOTE — Progress Notes (Signed)
Samuel Patterson is a 50 y.o. male  Chief Complaint  Patient presents with  . Establish Care    TOC- CPE/labs.  No concerns.  Declines flu shot today.     HPI: Samuel Patterson is a 50 y.o. male previously seen by Dr. Ashley Royalty and Dr. Doreene Burke here today for annual CPE, fasting labs.  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 He does not need refills at this time.  Last Colonoscopy: never - father w/ benign polyps Dental: UTD Vision: no vision issues and no glasses or contacts  Diet/Exercise: goes to gym most days in AM - 3 days of cardio and 2 days of weights; does not feel diet has changed but he has gained weight  Med refills needed today: none  Past Medical History:  Diagnosis Date  . Allergy    vasomotor rhinitis  . GERD (gastroesophageal reflux disease)   . Narcolepsy     History reviewed. No pertinent surgical history.  Social History   Socioeconomic History  . Marital status: Married    Spouse name: Not on file  . Number of children: Not on file  . Years of education: Not on file  . Highest education level: Not on file  Occupational History  . Not on file  Tobacco Use  . Smoking status: Never Smoker  . Smokeless tobacco: Never Used  Vaping Use  . Vaping Use: Never used  Substance and Sexual Activity  . Alcohol use: Yes    Comment: 2 beers daily  . Drug use: No  . Sexual activity: Yes  Other Topics Concern  . Not on file  Social History Narrative  . Not on file   Social Determinants of Health   Financial Resource Strain: Not on file  Food Insecurity: Not on file  Transportation Needs: Not on file  Physical Activity: Not on file  Stress: Not on file  Social Connections: Not on file  Intimate Partner Violence: Not on file    Family History  Problem Relation Age of Onset  . Colon polyps Father      Immunization History  Administered Date(s) Administered  . Influenza Split 07/25/2013  . Influenza,inj,Quad PF,6+ Mos 08/18/2016,  08/19/2017, 08/29/2019  . Influenza-Unspecified 07/25/2014  . PFIZER(Purple Top)SARS-COV-2 Vaccination 01/26/2020, 02/17/2020  . Td 11/24/2001  . Tdap 09/13/2012    Outpatient Encounter Medications as of 01/23/2021  Medication Sig  . Glucosamine-Chondroit-Vit C-Mn (GLUCOSAMINE CHONDR 500 COMPLEX PO) Take by mouth.  . methylphenidate (RITALIN) 10 MG tablet Take 1 tablet (10 mg total) by mouth 2 (two) times daily.  Marland Kitchen omeprazole (PRILOSEC OTC) 20 MG tablet Take 20 mg by mouth daily.  . methylphenidate (RITALIN) 10 MG tablet Take 1 tablet (10 mg total) by mouth 2 (two) times daily. (Patient not taking: Reported on 01/23/2021)  . methylphenidate (RITALIN) 10 MG tablet Take 1 tablet (10 mg total) by mouth 2 (two) times daily with breakfast and lunch. (Patient not taking: Reported on 01/23/2021)  . methylphenidate (RITALIN) 10 MG tablet Take 1 tablet (10 mg total) by mouth 2 (two) times daily. (Patient not taking: Reported on 01/23/2021)  . methylphenidate (RITALIN) 10 MG tablet Take 1 tablet (10 mg total) by mouth 2 (two) times daily. (Patient not taking: Reported on 01/23/2021)  . methylphenidate (RITALIN) 10 MG tablet TAKE 1 TABLET BY MOUTH 2 TIMES DAILY. FILL IN 90 DAYS (Patient not taking: Reported on 01/23/2021)   No facility-administered encounter medications on file as of 01/23/2021.  ROS: Gen: no fever, chills  Skin: no rash, itching ENT: no ear pain, ear drainage, nasal congestion, rhinorrhea, sinus pressure, sore throat Eyes: no blurry vision, double vision Resp: no cough, wheeze,SOB CV: no CP, palpitations, LE edema,  GI: no heartburn, n/v/d/c, abd pain GU: no dysuria, urgency, frequency, hematuria; no testicular swelling or masses MSK: no joint pain, myalgias, back pain Neuro: no dizziness, headache, weakness, vertigo Psych: no depression, anxiety, insomnia   No Known Allergies  BP 136/88   Pulse 82   Temp 97.8 F (36.6 C) (Temporal)   Ht 5\' 11"  (1.803 m)   Wt 222 lb 12.8 oz  (101.1 kg)   SpO2 98%   BMI 31.07 kg/m  Wt Readings from Last 3 Encounters:  01/23/21 222 lb 12.8 oz (101.1 kg)  02/27/20 213 lb 6.4 oz (96.8 kg)  08/29/19 207 lb 12.8 oz (94.3 kg)   Temp Readings from Last 3 Encounters:  01/23/21 97.8 F (36.6 C) (Temporal)  02/27/20 (!) 97.5 F (36.4 C) (Tympanic)  08/29/19 98 F (36.7 C)   BP Readings from Last 3 Encounters:  01/23/21 136/88  02/27/20 124/78  08/29/19 114/80   Pulse Readings from Last 3 Encounters:  01/23/21 82  02/27/20 78  08/29/19 74    Physical Exam Constitutional:      General: He is not in acute distress.    Appearance: He is well-developed and well-nourished.  HENT:     Head: Normocephalic and atraumatic.     Right Ear: Tympanic membrane and ear canal normal.     Left Ear: Tympanic membrane and ear canal normal.     Nose: Nose normal.     Mouth/Throat:     Mouth: Oropharynx is clear and moist and mucous membranes are normal.  Neck:     Thyroid: No thyromegaly.  Cardiovascular:     Rate and Rhythm: Normal rate and regular rhythm.     Pulses: Intact distal pulses.     Heart sounds: Normal heart sounds.  Pulmonary:     Effort: Pulmonary effort is normal.     Breath sounds: Normal breath sounds. No wheezing or rhonchi.  Abdominal:     General: Bowel sounds are normal. There is no distension.     Palpations: Abdomen is soft. There is no mass.     Tenderness: There is no abdominal tenderness. There is no guarding or rebound.  Musculoskeletal:        General: No edema.     Cervical back: Neck supple.     Right lower leg: No edema.     Left lower leg: No edema.  Lymphadenopathy:     Cervical: No cervical adenopathy.  Skin:    General: Skin is warm and dry.  Neurological:     Mental Status: He is alert and oriented to person, place, and time.     Motor: No abnormal muscle tone.     Coordination: Coordination normal.  Psychiatric:        Mood and Affect: Mood and affect normal.        Behavior:  Behavior normal.      A/P:  1. Annual physical exam - discussed importance of regular CV exercise, healthy diet, adequate sleep - due for colonoscopy - referral placed today - dental exam UTD, no vision issues and pt does not wear corrective lenses - declines flu vaccine - ALT - AST - Basic metabolic panel - CBC - next CPE in 1 year  2. 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 High risk medication use - stable, controlled on ritalin - no refill needed at this time - DRUG MONITORING, PANEL 8 WITH CONFIRMATION, URINE  5. Influenza vaccination declined by patient  6. Screening for colon cancer - Ambulatory referral to Gastroenterology   This visit occurred during the SARS-CoV-2 public health emergency.  Safety protocols were in place, including screening questions prior to the visit, additional usage of staff PPE, and extensive cleaning of exam room while observing appropriate contact time as indicated for disinfecting solutions.

## 2021-01-24 LAB — DRUG MONITORING, PANEL 8 WITH CONFIRMATION, URINE
6 Acetylmorphine: NEGATIVE ng/mL (ref <10)
Alcohol Metabolites: NEGATIVE ng/mL
Amphetamines: NEGATIVE ng/mL (ref <500)
Benzodiazepines: NEGATIVE ng/mL (ref <100)
Buprenorphine, Urine: NEGATIVE ng/mL (ref <5)
Cocaine Metabolite: NEGATIVE ng/mL (ref <150)
Creatinine: 23.3 mg/dL
MDMA: NEGATIVE ng/mL (ref <500)
Marijuana Metabolite: NEGATIVE ng/mL (ref <20)
Opiates: NEGATIVE ng/mL (ref <100)
Oxidant: NEGATIVE ug/mL
Oxycodone: NEGATIVE ng/mL (ref <100)
pH: 7.1 (ref 4.5–9.0)

## 2021-01-24 LAB — DM TEMPLATE

## 2021-01-25 ENCOUNTER — Encounter: Payer: Self-pay | Admitting: Family Medicine

## 2021-02-03 ENCOUNTER — Other Ambulatory Visit: Payer: Self-pay | Admitting: Family Medicine

## 2021-02-03 DIAGNOSIS — G47419 Narcolepsy without cataplexy: Secondary | ICD-10-CM

## 2021-02-06 MED ORDER — METHYLPHENIDATE HCL 10 MG PO TABS: 10.0000 mg | ORAL_TABLET | Freq: Two times a day (BID) | ORAL | 0 refills | Status: DC

## 2021-04-11 ENCOUNTER — Other Ambulatory Visit: Payer: Self-pay | Admitting: Family Medicine

## 2021-04-11 DIAGNOSIS — G47419 Narcolepsy without cataplexy: Secondary | ICD-10-CM

## 2021-04-12 NOTE — Telephone Encounter (Signed)
Last OV 01/23/21 Last fill 02/26/21  #60/0

## 2021-04-15 MED ORDER — METHYLPHENIDATE HCL 10 MG PO TABS: 10.0000 mg | ORAL_TABLET | Freq: Two times a day (BID) | ORAL | 0 refills | Status: DC

## 2021-06-06 ENCOUNTER — Other Ambulatory Visit: Payer: Self-pay | Admitting: Family Medicine

## 2021-06-06 DIAGNOSIS — G47419 Narcolepsy without cataplexy: Secondary | ICD-10-CM

## 2021-06-06 MED ORDER — METHYLPHENIDATE HCL 10 MG PO TABS: 10.0000 mg | ORAL_TABLET | Freq: Two times a day (BID) | ORAL | 0 refills | Status: DC

## 2021-07-30 ENCOUNTER — Other Ambulatory Visit: Payer: Self-pay

## 2021-07-30 DIAGNOSIS — G47419 Narcolepsy without cataplexy: Secondary | ICD-10-CM

## 2021-07-31 MED ORDER — METHYLPHENIDATE HCL 10 MG PO TABS: 10.0000 mg | ORAL_TABLET | Freq: Two times a day (BID) | ORAL | 0 refills | Status: DC

## 2021-07-31 NOTE — Telephone Encounter (Signed)
Refill request for: Ritaliin 10 mg LR 06/06/21, #60, 0 rf LOV 01/23/21 (Dr Barron Alvine) FOV  10/02/21 (Dr Veto Kemps) Please review and advise.   Thanks.Dm/cma

## 2021-09-26 ENCOUNTER — Other Ambulatory Visit: Payer: Self-pay | Admitting: Family Medicine

## 2021-09-26 DIAGNOSIS — G47419 Narcolepsy without cataplexy: Secondary | ICD-10-CM

## 2021-09-26 NOTE — Telephone Encounter (Signed)
Refill request for:  Methylphenidate 10 mg LR 07/31/21, # 60, 0 rf LOV  01/25/21 FOV  10/02/21.

## 2021-10-02 ENCOUNTER — Ambulatory Visit: Payer: BC Managed Care – PPO | Admitting: Family Medicine

## 2021-10-02 ENCOUNTER — Other Ambulatory Visit: Payer: Self-pay

## 2021-10-02 ENCOUNTER — Encounter: Payer: Self-pay | Admitting: Family Medicine

## 2021-10-02 VITALS — BP 130/90 | HR 90 | Temp 97.6°F | Ht 71.0 in | Wt 218.0 lb

## 2021-10-02 DIAGNOSIS — G47419 Narcolepsy without cataplexy: Secondary | ICD-10-CM | POA: Diagnosis not present

## 2021-10-02 DIAGNOSIS — K219 Gastro-esophageal reflux disease without esophagitis: Secondary | ICD-10-CM

## 2021-10-02 DIAGNOSIS — Z1211 Encounter for screening for malignant neoplasm of colon: Secondary | ICD-10-CM | POA: Diagnosis not present

## 2021-10-02 DIAGNOSIS — E781 Pure hyperglyceridemia: Secondary | ICD-10-CM

## 2021-10-02 DIAGNOSIS — Z23 Encounter for immunization: Secondary | ICD-10-CM

## 2021-10-02 DIAGNOSIS — J301 Allergic rhinitis due to pollen: Secondary | ICD-10-CM

## 2021-10-02 MED ORDER — FLUTICASONE PROPIONATE 50 MCG/ACT NA SUSP: 2.0000 | Freq: Every day | NASAL | 6 refills | Status: DC

## 2021-10-02 MED ORDER — METHYLPHENIDATE HCL 10 MG PO TABS: 10.0000 mg | ORAL_TABLET | Freq: Two times a day (BID) | ORAL | 0 refills | Status: DC
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
## 2021-10-11 ENCOUNTER — Encounter: Payer: Self-pay | Admitting: Gastroenterology

## 2021-11-28 ENCOUNTER — Other Ambulatory Visit: Payer: Self-pay | Admitting: Family Medicine

## 2021-11-28 DIAGNOSIS — G47419 Narcolepsy without cataplexy: Secondary | ICD-10-CM

## 2021-11-28 MED ORDER — METHYLPHENIDATE HCL 10 MG PO TABS: 10.0000 mg | ORAL_TABLET | Freq: Two times a day (BID) | ORAL | 0 refills | Status: DC

## 2021-11-28 NOTE — Telephone Encounter (Signed)
Refill request for: Ritalin 10 mg LR 10/02/21, #60, 0 rf LOV 10/02/21 FOV 01/30/22  Please review and advise.  Thanks. Dm/cma

## 2021-11-29 ENCOUNTER — Other Ambulatory Visit: Payer: Self-pay

## 2021-11-29 ENCOUNTER — Ambulatory Visit (AMBULATORY_SURGERY_CENTER): Payer: BC Managed Care – PPO | Admitting: *Deleted

## 2021-11-29 VITALS — Ht 71.0 in | Wt 215.0 lb

## 2021-11-29 DIAGNOSIS — Z1211 Encounter for screening for malignant neoplasm of colon: Secondary | ICD-10-CM

## 2021-11-29 DIAGNOSIS — Z8371 Family history of colonic polyps: Secondary | ICD-10-CM

## 2021-11-29 MED ORDER — NA SULFATE-K SULFATE-MG SULF 17.5-3.13-1.6 GM/177ML PO SOLN: 1.0000 | ORAL | 0 refills | Status: DC

## 2021-11-29 NOTE — Progress Notes (Signed)

## 2021-12-06 ENCOUNTER — Encounter: Payer: Self-pay | Admitting: Gastroenterology

## 2021-12-13 ENCOUNTER — Other Ambulatory Visit: Payer: Self-pay

## 2021-12-13 ENCOUNTER — Encounter: Payer: Self-pay | Admitting: Family Medicine

## 2021-12-13 ENCOUNTER — Ambulatory Visit (AMBULATORY_SURGERY_CENTER): Payer: BC Managed Care – PPO | Admitting: Gastroenterology

## 2021-12-13 ENCOUNTER — Encounter: Payer: Self-pay | Admitting: Gastroenterology

## 2021-12-13 VITALS — BP 110/72 | HR 67 | Temp 97.9°F | Resp 12 | Ht 71.0 in | Wt 215.0 lb

## 2021-12-13 DIAGNOSIS — Z1211 Encounter for screening for malignant neoplasm of colon: Secondary | ICD-10-CM

## 2021-12-13 DIAGNOSIS — K5790 Diverticulosis of intestine, part unspecified, without perforation or abscess without bleeding: Secondary | ICD-10-CM | POA: Insufficient documentation

## 2021-12-13 LAB — HM COLONOSCOPY

## 2021-12-13 MED ORDER — SODIUM CHLORIDE 0.9 % IV SOLN: 500.0000 mL | Freq: Once | INTRAVENOUS | Status: DC

## 2021-12-13 NOTE — Progress Notes (Signed)
History and Physical:  This patient presents for endoscopic testing for: Encounter Diagnosis  Name Primary?   Special screening for malignant neoplasms, colon Yes   First screening Patient denies chronic abdominal pain, rectal bleeding, constipation or diarrhea.   ROS: Patient denies chest pain or cough   Past Medical History: Past Medical History:  Diagnosis Date   Allergy    vasomotor rhinitis   GERD (gastroesophageal reflux disease)    Narcolepsy      Past Surgical History: Past Surgical History:  Procedure Laterality Date   VASECTOMY      Allergies: No Known Allergies  Outpatient Meds: Current Outpatient Medications  Medication Sig Dispense Refill   fluticasone (FLONASE) 50 MCG/ACT nasal spray Place 2 sprays into both nostrils daily. 16 g 6   Glucosamine-Chondroit-Vit C-Mn (GLUCOSAMINE CHONDR 500 COMPLEX PO) Take by mouth.     METAMUCIL FIBER PO Take by mouth.     methylphenidate (RITALIN) 10 MG tablet Take 1 tablet (10 mg total) by mouth 2 (two) times daily. 60 tablet 0   Multiple Vitamin (MULTIVITAMIN) tablet Take 1 tablet by mouth daily.     Omega-3 Fatty Acids (FISH OIL) 1000 MG CAPS Take by mouth.     omeprazole (PRILOSEC OTC) 20 MG tablet Take 20 mg by mouth daily.     Current Facility-Administered Medications  Medication Dose Route Frequency Provider Last Rate Last Admin   0.9 %  sodium chloride infusion  500 mL Intravenous Once Sherrilyn Rist, MD          ___________________________________________________________________ Objective   Exam:  BP (!) 129/97    Pulse 71    Temp 97.9 F (36.6 C)    Resp 18    Ht 5\' 11"  (1.803 m)    Wt 215 lb (97.5 kg)    SpO2 98%    BMI 29.99 kg/m   CV: RRR without murmur, S1/S2 Resp: clear to auscultation bilaterally, normal RR and effort noted GI: soft, no tenderness, with active bowel sounds.   Assessment: Encounter Diagnosis  Name Primary?   Special screening for malignant neoplasms, colon Yes      Plan: Colonoscopy  The benefits and risks of the planned procedure were described in detail with the patient or (when appropriate) their health care proxy.  Risks were outlined as including, but not limited to, bleeding, infection, perforation, adverse medication reaction leading to cardiac or pulmonary decompensation, pancreatitis (if ERCP).  The limitation of incomplete mucosal visualization was also discussed.  No guarantees or warranties were given.    The patient is appropriate for an endoscopic procedure in the ambulatory setting.   - , MD

## 2021-12-13 NOTE — Progress Notes (Signed)
A and O x3. Report to RN. Tolerated MAC anesthesia well. 

## 2021-12-13 NOTE — Patient Instructions (Signed)
Handout on hemorrhoids and diverticulosis provided.  Repeat colonoscopy in 10 years for screening.  YOU HAD AN ENDOSCOPIC PROCEDURE TODAY AT THE [facility]:   Refer to the procedure report that was given to you for any specific questions about what was found during the examination.  If the procedure report does not answer your questions, please call your gastroenterologist to clarify.  If you requested that your care partner not be given the details of your procedure findings, then the procedure report has been included in a sealed envelope for you to review at your convenience later.  YOU SHOULD EXPECT: Some feelings of bloating in the abdomen. Passage of more gas than usual.  Walking can help get rid of the air that was put into your GI tract during the procedure and reduce the bloating. If you had a lower endoscopy (such as a colonoscopy or flexible sigmoidoscopy) you may notice spotting of blood in your stool or on the toilet paper. If you underwent a bowel prep for your procedure, you may not have a normal bowel movement for a few days.  Please Note:  You might notice some irritation and congestion in your nose or some drainage.  This is from the oxygen used during your procedure.  There is no need for concern and it should clear up in a day or so.  SYMPTOMS TO REPORT IMMEDIATELY:  Following lower endoscopy (colonoscopy or flexible sigmoidoscopy):  Excessive amounts of blood in the stool  Significant tenderness or worsening of abdominal pains  Swelling of the abdomen that is new, acute  Fever of 100F or higher   For urgent or emergent issues, a gastroenterologist can be reached at any hour by calling [phone]. Do not use MyChart messaging for urgent concerns.    DIET:  We do recommend a small meal at first, but then you may proceed to your regular diet.  Drink plenty of fluids but you should avoid alcoholic beverages for 24 hours.  ACTIVITY:  You should plan to take  it easy for the rest of today and you should NOT DRIVE or use heavy machinery until tomorrow (because of the sedation medicines used during the test).    FOLLOW UP: Our staff will call the number listed on your records 48-72 hours following your procedure to check on you and address any questions or concerns that you may have regarding the information given to you following your procedure. If we do not reach you, we will leave a message.  We will attempt to reach you two times.  During this call, we will ask if you have developed any symptoms of COVID 19. If you develop any symptoms (ie: fever, flu-like symptoms, shortness of breath, cough etc.) before then, please call [phone].  If you test positive for Covid 19 in the 2 weeks post procedure, please call and report this information to [place].    If any biopsies were taken you will be contacted by phone or by letter within the next 1-3 weeks.  Please call us at [phone] if you have not heard about the biopsies in 3 weeks.    SIGNATURES/CONFIDENTIALITY: You and/or your care partner have signed paperwork which will be entered into your electronic medical record.  These signatures attest to the fact that that the information above on your After Visit Summary has been reviewed and is understood.  Full responsibility of the confidentiality of this discharge information lies with you and/or your care-partner.

## 2021-12-13 NOTE — Op Note (Signed)
[facility] Patient Name: Samuel ParadiseMichael Patterson Procedure Date: 12/13/2021 8:15 AM MRN: [id number] Endoscopist: Sherilyn CooterHenry L. Myrtie Neitheranis , MD Age: [age] Referring MD:  Date of Birth: 05/31/1971 Gender: Male Account #: [id number] Procedure:                Colonoscopy Indications:              Screening for colorectal malignant neoplasm, This                            is the patient's first colonoscopy Medicines:                Monitored Anesthesia Care Procedure:                Pre-Anesthesia Assessment:                           - Prior to the procedure, a History and Physical                            was performed, and patient medications and                            allergies were reviewed. The patient's tolerance of                            previous anesthesia was also reviewed. The risks                            and benefits of the procedure and the sedation                            options and risks were discussed with the patient.                            All questions were answered, and informed consent                            was obtained. Prior Anticoagulants: The patient has                            taken no previous anticoagulant or antiplatelet                            agents. ASA Grade Assessment: II - A patient with                            mild systemic disease. After reviewing the risks                            and benefits, the patient was deemed in                            satisfactory condition to undergo the procedure.  After obtaining informed consent, the colonoscope                            was passed under direct vision. Throughout the                            procedure, the patient's blood pressure, pulse, and                            oxygen saturations were monitored continuously. The                            CF [id number] #[id number] was introduced through the anus                            and advanced to the the  cecum, identified by                            appendiceal orifice and ileocecal valve. The                            colonoscopy was performed without difficulty. The                            patient tolerated the procedure well. The quality                            of the bowel preparation was excellent. The                            ileocecal valve, appendiceal orifice, and rectum                            were photographed. The bowel preparation used was                            SUPREP. Scope In: 8:29:03 AM Scope Out: 8:43:01 AM Scope Withdrawal Time: 0 hours 10 minutes 48 seconds  Total Procedure Duration: 0 hours 13 minutes 58 seconds  Findings:                 The perianal and digital rectal examinations were                            normal.                           Repeat examination of right colon under NBI                            performed.                           A few small-mouthed diverticula were found in the  left colon.                           Internal hemorrhoids were found.                           There is no endoscopic evidence of polyps in the                            entire colon.                           The exam was otherwise without abnormality on                            direct and retroflexion views. Complications:            No immediate complications. Estimated Blood Loss:     Estimated blood loss: none. Impression:               - Diverticulosis in the left colon.                           - Internal hemorrhoids.                           - The examination was otherwise normal on direct                            and retroflexion views.                           - No specimens collected. Recommendation:           - Patient has a contact number available for                            emergencies. The signs and symptoms of potential                            delayed complications were discussed with the                             patient. Return to normal activities tomorrow.                            Written discharge instructions were provided to the                            patient.                           - Resume previous diet.                           - Continue present medications.                           - Repeat  colonoscopy in 10 years for screening                            purposes. Benedetta Sundstrom L. Myrtie Neither, MD 12/13/2021 8:46:06 AM This report has been signed electronically.

## 2021-12-13 NOTE — Progress Notes (Signed)
VS- Samuel Patterson  Pt's states no medical or surgical changes since previsit or office visit.  

## 2021-12-17 ENCOUNTER — Telehealth: Payer: Self-pay | Admitting: *Deleted

## 2021-12-17 NOTE — Telephone Encounter (Signed)
°  Follow up Call-  Call back number 12/13/2021  Post procedure Call Back phone  # [phone]  Permission to leave phone message Yes  Some recent data might be hidden     Patient questions:  Do you have a fever, pain , or abdominal swelling? No. Pain Score  0 *  Have you tolerated food without any problems? Yes.    Have you been able to return to your normal activities? Yes.    Do you have any questions about your discharge instructions: Diet   No. Medications  No. Follow up visit  No.  Do you have questions or concerns about your Care? No.  Actions: * If pain score is 4 or above: No action needed, pain <4.  Have you developed a fever since your procedure? no  2.   Have you had an respiratory symptoms (SOB or cough) since your procedure? no  3.   Have you tested positive for COVID 19 since your procedure no  4.   Have you had any family members/close contacts diagnosed with the COVID 19 since your procedure?  no   If yes to any of these questions please route to Laverna Peace, RN and Karlton Lemon, RN

## 2022-01-28 ENCOUNTER — Other Ambulatory Visit: Payer: Self-pay

## 2022-01-30 ENCOUNTER — Ambulatory Visit: Payer: BC Managed Care – PPO | Admitting: Family Medicine

## 2022-01-30 ENCOUNTER — Encounter: Payer: Self-pay | Admitting: Family Medicine

## 2022-01-30 ENCOUNTER — Other Ambulatory Visit: Payer: Self-pay

## 2022-01-30 VITALS — BP 120/78 | HR 79 | Temp 97.4°F | Ht 71.0 in | Wt 221.0 lb

## 2022-01-30 DIAGNOSIS — G47419 Narcolepsy without cataplexy: Secondary | ICD-10-CM | POA: Diagnosis not present

## 2022-01-30 DIAGNOSIS — Z1159 Encounter for screening for other viral diseases: Secondary | ICD-10-CM

## 2022-01-30 DIAGNOSIS — E781 Pure hyperglyceridemia: Secondary | ICD-10-CM

## 2022-01-30 DIAGNOSIS — J301 Allergic rhinitis due to pollen: Secondary | ICD-10-CM

## 2022-01-30 MED ORDER — METHYLPHENIDATE HCL 10 MG PO TABS: 10.0000 mg | ORAL_TABLET | Freq: Two times a day (BID) | ORAL | 0 refills | Status: DC

## 2022-01-30 MED ORDER — FLONASE SENSIMIST 27.5 MCG/SPRAY NA SUSP: 2.0000 | Freq: Every day | NASAL | 12 refills | Status: DC

## 2022-01-30 NOTE — Progress Notes (Signed)
?[facility] ?[facility] ?[address] ?[place] [address] ?Dept: [phone] ?Dept Fax: [phone] ? ?Chronic Care Office Visit ? ?Subjective:  ? ? Patient ID: Samuel Patterson, male    DOB: 04-26-1971, 51 y.o..   MRN: [id number] ? ?Chief Complaint  ?Patient presents with  ? Follow-up  ?  4 month f/u. Flonase causing nose bleed, using daily.   ? ? ?History of Present Illness: ? ?Patient is in today for reassessment of chronic medical issues. ? 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 This was diagnosed in ~ 2002. He was intially treated with Provigil, but did not respond. He was then treated with Ritalin, which has worked well since. He feels this is working well presently. ? ?Samuel Patterson has a history of hypertriglyceridemia. He is currently taking fish oil to manage this. ?  ?Samuel Patterson had been started on Flonase for allergic rhinitis issues at his last visit. He notes his nasal and ear congestion have improved, but he is getting some bleeding from the right nostril. ? ?Past Medical History: ?Patient Active Problem List  ? Diagnosis Date Noted  ? Diverticulosis 12/13/2021  ? Gastroesophageal reflux disease 10/02/2021  ? Hypertriglyceridemia 10/02/2021  ? High risk medication use 02/27/2020  ? 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 ?Past Surgical History:  ?Procedure Laterality Date  ? VASECTOMY    ? ?Family History  ?Problem Relation Age of Onset  ? Cancer Mother   ?     Breast  ? Dementia Mother   ? Heart disease Father   ? Colon polyps Father   ? Heart disease Brother 40  ?     MI  ? Parkinson's disease Maternal Grandfather   ? Diabetes Paternal Grandmother   ? Stroke Paternal Grandmother   ? Heart disease Paternal Grandfather   ? Colon cancer Neg Hx   ? Esophageal cancer Neg Hx   ? Rectal cancer Neg Hx   ? Stomach cancer Neg Hx   ? ?Outpatient Medications Prior to Visit  ?Medication Sig Dispense Refill  ? Glucosamine-Chondroit-Vit C-Mn (GLUCOSAMINE CHONDR 500  COMPLEX PO) Take by mouth.    ? METAMUCIL FIBER PO Take by mouth.    ? Multiple Vitamin (MULTIVITAMIN) tablet Take 1 tablet by mouth daily.    ? Omega-3 Fatty Acids (FISH OIL) 1000 MG CAPS Take by mouth.    ? omeprazole (PRILOSEC OTC) 20 MG tablet Take 20 mg by mouth daily.    ? fluticasone (FLONASE) 50 MCG/ACT nasal spray Place 2 sprays into both nostrils daily. 16 g 6  ? methylphenidate (RITALIN) 10 MG tablet Take 1 tablet (10 mg total) by mouth 2 (two) times daily. 60 tablet 0  ? ?No facility-administered medications prior to visit.  ? ?No Known Allergies ?   ?Objective:  ? ?Today's Vitals  ? 01/30/22 6629  ?BP: 120/78  ?Pulse: 79  ?Temp: (!) 97.4 ?F (36.3 ?C)  ?TempSrc: Temporal  ?SpO2: 98%  ?Weight: 221 lb (100.2 kg)  ?Height: 5\' 11"  (1.803 m)  ? ?Body mass index is 30.82 kg/m?.  ? ?General: Well developed, well nourished. No acute distress. ?HEENT: Normocephalic, non-traumatic. There is a small amount of dried blood  around the  ? nasal opening on the right. There is redness tot he nasal mucosa in the septal side of the  ? nasal antrum. There is mild swelling of the turbinate and it is bluish. No rhinorrhea noted. ?Psych: Alert and oriented. Normal mood and affect. ? ?Health  Maintenance Due  ?Topic Date Due  ? HIV Screening  Never done  ? Hepatitis C Screening  Never done  ?   ?Assessment & Plan:  ? ?1. Narcolepsy without cataplexy ?Stable. We will continue Ritalin. ? ?- methylphenidate (RITALIN) 10 MG tablet; Take 1 tablet (10 mg total) by mouth 2 (two) times daily.  Dispense: 60 tablet; Refill: 0 ? ?2. Seasonal allergic rhinitis due to pollen ?The epistaxis likely is due to drying form use of the nasal steroid. I will try switching him to a different formulation. I also recommend he use salt water spray in the nose twice a day. We also discussed applying Vaseline with a cotton swab just inside the nasal antrum. ? ?- fluticasone (FLONASE SENSIMIST) 27.5 MCG/SPRAY nasal spray; Place 2 sprays into the nose  daily.  Dispense: 10 g; Refill: 12 ? ?3. Hypertriglyceridemia ?Due for repeat lipids. ? ?- Lipid panel; Future ? ?4. Encounter for hepatitis C screening test for low risk patient ? ?- HCV Ab w Reflex to Quant PCR; Future ? ?Return in about 6 months (around 08/02/2022) for Reassessment.  ? ?Loyola Mast, MD ?

## 2022-02-04 ENCOUNTER — Other Ambulatory Visit (INDEPENDENT_AMBULATORY_CARE_PROVIDER_SITE_OTHER): Payer: BC Managed Care – PPO

## 2022-02-04 ENCOUNTER — Other Ambulatory Visit: Payer: Self-pay

## 2022-02-04 DIAGNOSIS — Z1159 Encounter for screening for other viral diseases: Secondary | ICD-10-CM

## 2022-02-04 DIAGNOSIS — E781 Pure hyperglyceridemia: Secondary | ICD-10-CM

## 2022-02-04 LAB — LIPID PANEL
Cholesterol: 195 mg/dL (ref 0–200)
HDL: 44.6 mg/dL (ref >39.00)
LDL Cholesterol: 123 mg/dL — ABNORMAL HIGH (ref 0–99)
NonHDL: 150.68
Total CHOL/HDL Ratio: 4
Triglycerides: 140 mg/dL (ref 0.0–149.0)
VLDL: 28 mg/dL (ref 0.0–40.0)

## 2022-02-05 LAB — HCV INTERPRETATION

## 2022-02-05 LAB — HCV AB W REFLEX TO QUANT PCR: HCV Ab: NONREACTIVE

## 2022-04-02 ENCOUNTER — Other Ambulatory Visit: Payer: Self-pay | Admitting: Family Medicine

## 2022-04-02 DIAGNOSIS — G47419 Narcolepsy without cataplexy: Secondary | ICD-10-CM

## 2022-04-02 MED ORDER — METHYLPHENIDATE HCL 10 MG PO TABS
10.0000 mg | ORAL_TABLET | Freq: Two times a day (BID) | ORAL | 0 refills | Status: DC
Start: 2022-04-02 — End: 2022-06-12

## 2022-04-02 NOTE — Telephone Encounter (Signed)
Refill request for: ?Ritalin 10 mg ?LR 01/30/22, #60, 0 rfs ?LOV 01/30/22 ?FOV 08/04/22 ? ?Please review and advise  ?Thanks. Dm/cma ? ?

## 2022-05-06 ENCOUNTER — Ambulatory Visit: Payer: BC Managed Care – PPO | Admitting: Family Medicine

## 2022-05-06 ENCOUNTER — Encounter: Payer: Self-pay | Admitting: Family Medicine

## 2022-05-06 VITALS — BP 134/86 | HR 106 | Temp 102.6°F | Wt 211.6 lb

## 2022-05-06 DIAGNOSIS — B349 Viral infection, unspecified: Secondary | ICD-10-CM

## 2022-05-06 DIAGNOSIS — R52 Pain, unspecified: Secondary | ICD-10-CM | POA: Diagnosis not present

## 2022-05-06 LAB — POC COVID19 BINAXNOW: SARS Coronavirus 2 Ag: NEGATIVE

## 2022-05-06 LAB — POCT INFLUENZA A/B
Influenza A, POC: NEGATIVE
Influenza B, POC: NEGATIVE

## 2022-05-06 MED ORDER — IBUPROFEN 800 MG PO TABS: 800.0000 mg | ORAL_TABLET | Freq: Three times a day (TID) | ORAL | 0 refills | Status: DC | PRN

## 2022-05-06 NOTE — Progress Notes (Signed)
Subjective:     Samuel Patterson is a 51 y.o. male who presents for evaluation of fever. He has had the fever for 3 day. Symptoms have been unchanged. Symptoms are described as fevers up to 101F degrees, . Associated symptoms chills and headache. Patient denies abdominal pain, diarrhea, nausea, and URI symptoms.  He has tried to alleviate the symptoms with acetaminophen and NyQuil and DayQuil and Alka-Seltzer  with minimal relief. The patient has no known comorbidities (structural heart/valvular disease, prosthetic joints, immunocompromised state, recent dental work, known abscesses).  The following portions of the patient's history were reviewed and updated as appropriate: allergies, current medications, past family history, past medical history, past social history, past surgical history, and problem list.  Review of Systems Pertinent items noted in HPI and remainder of comprehensive ROS otherwise negative.   Objective:   Vitals:   05/06/22 1319  BP: 134/86  Pulse: (!) 106  Temp: (!) 102.6 F (39.2 C)  SpO2: 97%     Gen: NAD, resting comfortably CV: RRR with no murmurs appreciated Pulm: NWOB, CTAB with no crackles, wheezes, or rhonchi GI: Normal bowel sounds present. Soft, Nontender, Nondistended. MSK: no edema, cyanosis, or clubbing noted Skin: warm, dry Neuro: grossly normal, moves all extremities Psych: Normal affect and thought content    Assessment:    Fever is likely secondary to viral etiology.   Plan:  COVID and flu negative  Supportive care with appropriate antipyretics and fluids. Hydrographic surveyor. Follow up in as needed   or as needed.

## 2022-05-09 ENCOUNTER — Inpatient Hospital Stay (HOSPITAL_COMMUNITY)
Admission: EM | Admit: 2022-05-09 | Discharge: 2022-05-15 | DRG: 871 | Disposition: A | Discharge status: Home / Self Care | Payer: BC Managed Care – PPO | Attending: Internal Medicine | Admitting: Internal Medicine

## 2022-05-09 ENCOUNTER — Emergency Department (HOSPITAL_COMMUNITY): Payer: BC Managed Care – PPO

## 2022-05-09 ENCOUNTER — Encounter (HOSPITAL_COMMUNITY): Payer: Self-pay

## 2022-05-09 ENCOUNTER — Observation Stay (HOSPITAL_COMMUNITY): Payer: BC Managed Care – PPO

## 2022-05-09 DIAGNOSIS — K769 Liver disease, unspecified: Secondary | ICD-10-CM

## 2022-05-09 DIAGNOSIS — A419 Sepsis, unspecified organism: Principal | ICD-10-CM | POA: Diagnosis present

## 2022-05-09 DIAGNOSIS — K760 Fatty (change of) liver, not elsewhere classified: Secondary | ICD-10-CM | POA: Diagnosis present

## 2022-05-09 DIAGNOSIS — Z789 Other specified health status: Secondary | ICD-10-CM | POA: Diagnosis not present

## 2022-05-09 DIAGNOSIS — Z820 Family history of epilepsy and other diseases of the nervous system: Secondary | ICD-10-CM

## 2022-05-09 DIAGNOSIS — R748 Abnormal levels of other serum enzymes: Secondary | ICD-10-CM

## 2022-05-09 DIAGNOSIS — E876 Hypokalemia: Secondary | ICD-10-CM

## 2022-05-09 DIAGNOSIS — Z823 Family history of stroke: Secondary | ICD-10-CM

## 2022-05-09 DIAGNOSIS — Z803 Family history of malignant neoplasm of breast: Secondary | ICD-10-CM

## 2022-05-09 DIAGNOSIS — R739 Hyperglycemia, unspecified: Secondary | ICD-10-CM | POA: Diagnosis present

## 2022-05-09 DIAGNOSIS — E781 Pure hyperglyceridemia: Secondary | ICD-10-CM

## 2022-05-09 DIAGNOSIS — F1090 Alcohol use, unspecified, uncomplicated: Secondary | ICD-10-CM

## 2022-05-09 DIAGNOSIS — J342 Deviated nasal septum: Secondary | ICD-10-CM | POA: Diagnosis present

## 2022-05-09 DIAGNOSIS — K219 Gastro-esophageal reflux disease without esophagitis: Secondary | ICD-10-CM | POA: Diagnosis not present

## 2022-05-09 DIAGNOSIS — R509 Fever, unspecified: Principal | ICD-10-CM

## 2022-05-09 DIAGNOSIS — Z8249 Family history of ischemic heart disease and other diseases of the circulatory system: Secondary | ICD-10-CM

## 2022-05-09 DIAGNOSIS — K3580 Unspecified acute appendicitis: Secondary | ICD-10-CM

## 2022-05-09 DIAGNOSIS — Z79899 Other long term (current) drug therapy: Secondary | ICD-10-CM

## 2022-05-09 DIAGNOSIS — Z20822 Contact with and (suspected) exposure to covid-19: Secondary | ICD-10-CM | POA: Diagnosis present

## 2022-05-09 DIAGNOSIS — G47419 Narcolepsy without cataplexy: Secondary | ICD-10-CM

## 2022-05-09 DIAGNOSIS — K750 Abscess of liver: Secondary | ICD-10-CM | POA: Diagnosis present

## 2022-05-09 DIAGNOSIS — Z833 Family history of diabetes mellitus: Secondary | ICD-10-CM

## 2022-05-09 LAB — CBC
HCT: 38.9 % — ABNORMAL LOW (ref 39.0–52.0)
Hemoglobin: 13.3 g/dL (ref 13.0–17.0)
MCH: 30.9 pg (ref 26.0–34.0)
MCHC: 34.2 g/dL (ref 30.0–36.0)
MCV: 90.3 fL (ref 80.0–100.0)
Platelets: 159 10*3/uL (ref 150–400)
RBC: 4.31 MIL/uL (ref 4.22–5.81)
RDW: 12.8 % (ref 11.5–15.5)
WBC: 14.3 10*3/uL — ABNORMAL HIGH (ref 4.0–10.5)
nRBC: 0 % (ref 0.0–0.2)

## 2022-05-09 LAB — COMPREHENSIVE METABOLIC PANEL
ALT: 308 U/L — ABNORMAL HIGH (ref 0–44)
AST: 101 U/L — ABNORMAL HIGH (ref 15–41)
Albumin: 2.9 g/dL — ABNORMAL LOW (ref 3.5–5.0)
Alkaline Phosphatase: 242 U/L — ABNORMAL HIGH (ref 38–126)
Anion gap: 14 (ref 5–15)
BUN: 10 mg/dL (ref 6–20)
CO2: 22 mmol/L (ref 22–32)
Calcium: 8.4 mg/dL — ABNORMAL LOW (ref 8.9–10.3)
Chloride: 99 mmol/L (ref 98–111)
Creatinine, Ser: 1.04 mg/dL (ref 0.61–1.24)
GFR, Estimated: >60 mL/min (ref >60)
Glucose, Bld: 171 mg/dL — ABNORMAL HIGH (ref 70–99)
Potassium: 3.2 mmol/L — ABNORMAL LOW (ref 3.5–5.1)
Sodium: 135 mmol/L (ref 135–145)
Total Bilirubin: 3.5 mg/dL — ABNORMAL HIGH (ref 0.3–1.2)
Total Protein: 7 g/dL (ref 6.5–8.1)

## 2022-05-09 LAB — PROTIME-INR
INR: 1.1 (ref 0.8–1.2)
Prothrombin Time: 13.9 seconds (ref 11.4–15.2)

## 2022-05-09 LAB — URINALYSIS, ROUTINE W REFLEX MICROSCOPIC
Bacteria, UA: NONE SEEN
Glucose, UA: NEGATIVE mg/dL
Ketones, ur: NEGATIVE mg/dL
Leukocytes,Ua: NEGATIVE
Nitrite: NEGATIVE
Protein, ur: 100 mg/dL — AB
Specific Gravity, Urine: 1.027 (ref 1.005–1.030)
pH: 5 (ref 5.0–8.0)

## 2022-05-09 LAB — APTT: aPTT: 30 seconds (ref 24–36)

## 2022-05-09 LAB — SARS CORONAVIRUS 2 BY RT PCR: SARS Coronavirus 2 by RT PCR: NEGATIVE

## 2022-05-09 LAB — LIPASE, BLOOD: Lipase: 23 U/L (ref 11–51)

## 2022-05-09 LAB — LACTIC ACID, PLASMA: Lactic Acid, Venous: 1.3 mmol/L (ref 0.5–1.9)

## 2022-05-09 LAB — HEPATITIS PANEL, ACUTE
HCV Ab: NONREACTIVE
Hep A IgM: NONREACTIVE
Hep B C IgM: NONREACTIVE
Hepatitis B Surface Ag: NONREACTIVE

## 2022-05-09 LAB — PROCALCITONIN: Procalcitonin: 6.01 ng/mL

## 2022-05-09 LAB — SEDIMENTATION RATE: Sed Rate: 85 mm/hr — ABNORMAL HIGH (ref 0–16)

## 2022-05-09 LAB — HIV ANTIBODY (ROUTINE TESTING W REFLEX): HIV Screen 4th Generation wRfx: NONREACTIVE

## 2022-05-09 MED ORDER — LACTATED RINGERS IV BOLUS
1000.0000 mL | Freq: Once | INTRAVENOUS | Status: AC
  Administered 2022-05-09: 1000 mL via INTRAVENOUS

## 2022-05-09 MED ORDER — GADOBUTROL 1 MMOL/ML IV SOLN
10.0000 mL | Freq: Once | INTRAVENOUS | Status: AC | PRN
  Administered 2022-05-09: 10 mL via INTRAVENOUS

## 2022-05-09 MED ORDER — IBUPROFEN 400 MG PO TABS
400.0000 mg | ORAL_TABLET | Freq: Four times a day (QID) | ORAL | Status: DC | PRN
Start: 2022-05-09 — End: 2022-05-15
  Administered 2022-05-09 – 2022-05-14 (×8): 400 mg via ORAL
  Filled 2022-05-09 (×9): qty 1

## 2022-05-09 MED ORDER — SODIUM CHLORIDE 0.9 % IV SOLN
2.0000 g | INTRAVENOUS | Status: DC
  Administered 2022-05-10 – 2022-05-11 (×2): 2 g via INTRAVENOUS
  Filled 2022-05-09 (×2): qty 20

## 2022-05-09 MED ORDER — IOHEXOL 300 MG/ML  SOLN
100.0000 mL | Freq: Once | INTRAMUSCULAR | Status: AC | PRN
  Administered 2022-05-09: 100 mL via INTRAVENOUS

## 2022-05-09 MED ORDER — SODIUM CHLORIDE 0.9 % IV SOLN
2.0000 g | Freq: Once | INTRAVENOUS | Status: AC
  Administered 2022-05-09: 2 g via INTRAVENOUS
  Filled 2022-05-09: qty 20

## 2022-05-09 MED ORDER — FLUTICASONE FUROATE 27.5 MCG/SPRAY NA SUSP
2.0000 | Freq: Every day | NASAL | Status: DC
Start: 2022-05-09 — End: 2022-05-09

## 2022-05-09 MED ORDER — FISH OIL 1000 MG PO CAPS: 1000.0000 mg | ORAL_CAPSULE | Freq: Every day | ORAL | Status: DC

## 2022-05-09 MED ORDER — LACTATED RINGERS IV SOLN: INTRAVENOUS | Status: DC

## 2022-05-09 MED ORDER — FENTANYL CITRATE PF 50 MCG/ML IJ SOSY
50.0000 ug | PREFILLED_SYRINGE | Freq: Once | INTRAMUSCULAR | Status: AC
  Administered 2022-05-09: 50 ug via INTRAVENOUS
  Filled 2022-05-09 (×2): qty 1

## 2022-05-09 MED ORDER — PANTOPRAZOLE SODIUM 40 MG IV SOLR
40.0000 mg | INTRAVENOUS | Status: DC
  Administered 2022-05-09 – 2022-05-10 (×2): 40 mg via INTRAVENOUS
  Filled 2022-05-09 (×2): qty 10

## 2022-05-09 MED ORDER — METHYLPHENIDATE HCL 5 MG PO TABS
10.0000 mg | ORAL_TABLET | Freq: Two times a day (BID) | ORAL | Status: DC
  Administered 2022-05-10 – 2022-05-15 (×9): 10 mg via ORAL
  Filled 2022-05-09 (×12): qty 2

## 2022-05-09 MED ORDER — ONDANSETRON HCL 4 MG/2ML IJ SOLN
4.0000 mg | Freq: Once | INTRAMUSCULAR | Status: AC
  Administered 2022-05-09: 4 mg via INTRAVENOUS
  Filled 2022-05-09: qty 2

## 2022-05-09 MED ORDER — ALBUTEROL SULFATE (2.5 MG/3ML) 0.083% IN NEBU: 2.5000 mg | INHALATION_SOLUTION | RESPIRATORY_TRACT | Status: DC | PRN

## 2022-05-09 MED ORDER — POTASSIUM CHLORIDE 10 MEQ/100ML IV SOLN
10.0000 meq | INTRAVENOUS | Status: AC
  Administered 2022-05-09 (×2): 10 meq via INTRAVENOUS
  Filled 2022-05-09 (×2): qty 100

## 2022-05-09 MED ORDER — ONDANSETRON HCL 4 MG/2ML IJ SOLN
4.0000 mg | Freq: Four times a day (QID) | INTRAMUSCULAR | Status: DC | PRN
  Administered 2022-05-09 – 2022-05-11 (×2): 4 mg via INTRAVENOUS
  Filled 2022-05-09 (×2): qty 2

## 2022-05-09 MED ORDER — ACETAMINOPHEN 650 MG RE SUPP
650.0000 mg | Freq: Four times a day (QID) | RECTAL | Status: DC | PRN
  Administered 2022-05-11: 650 mg via RECTAL
  Filled 2022-05-09: qty 1

## 2022-05-09 MED ORDER — SODIUM CHLORIDE 0.9 % IV BOLUS
1000.0000 mL | Freq: Once | INTRAVENOUS | Status: AC
Start: 2022-05-09 — End: 2022-05-09
  Administered 2022-05-09: 1000 mL via INTRAVENOUS

## 2022-05-09 MED ORDER — POTASSIUM CHLORIDE 2 MEQ/ML IV SOLN
INTRAVENOUS | Status: DC
  Filled 2022-05-09 (×3): qty 1000

## 2022-05-09 MED ORDER — OMEGA-3-ACID ETHYL ESTERS 1 G PO CAPS
1.0000 g | ORAL_CAPSULE | Freq: Every day | ORAL | Status: DC
  Administered 2022-05-10 – 2022-05-15 (×6): 1 g via ORAL
  Filled 2022-05-09 (×7): qty 1

## 2022-05-09 MED ORDER — FLUTICASONE PROPIONATE 50 MCG/ACT NA SUSP
2.0000 | Freq: Every day | NASAL | Status: DC
  Administered 2022-05-10 – 2022-05-14 (×5): 2 via NASAL
  Filled 2022-05-09: qty 16

## 2022-05-09 MED ORDER — METRONIDAZOLE 500 MG/100ML IV SOLN
500.0000 mg | Freq: Two times a day (BID) | INTRAVENOUS | Status: DC
  Administered 2022-05-09 – 2022-05-12 (×6): 500 mg via INTRAVENOUS
  Filled 2022-05-09 (×6): qty 100

## 2022-05-09 NOTE — H&P (Addendum)
History and Physical    DOA: 05/09/2022  PCP: Loyola Mastudd, Stephen M, MD  Patient coming from: home  Chief Complaint: fevers, n/v  HPI: Samuel Patterson is a 51 y.o. male with history h/o GERD, diverticulosis, hypertriglyceridemia presents with persistent fevers over the last few days. Was evaluated by PCP on 6/13, had fever 102.6 F and felt to have viral illness possible, advised to take OTC antipyretics and encourage fluid intake.  Patient reports continued poor oral intake, high fevers with Tmax of 103F yesterday at home along with nausea, vomiting x1 this morning prompting ED visit.  Patient denies any abdominal pain per se.  Denies any hematemesis or melena or hematochezia.  Drinks ~ 2 beers a day but never had alcohol withdrawals.  He is accompanied by his wife who is at bedside currently and corroborates his history.  He has not been taking any new medications except for ibuprofen 800 mg about twice a day as advised by his PCP for fevers. ED course: Tmax 99.23F, pulse 134, RR 18-20, SBP 1 18-1 34, diastolic BP in 70s, O2 sat 93 to 96% on room air.  Labs show WBC 14.3, hemoglobin 13.3, hematocrit 38.9, platelet 159, sodium 135, potassium 3.2, bicarb 22, BUN 10, creatinine 1.04, calcium 8.4, glucose 171.  Elevated liver enzymes with AST 101, ALT 308, ALP 242, albumin 2.9, T. bili 3.5, lipase normal at 23.  Ultrasonogram abdomen was obtained in the ED which reported Multiple round to ovoid hypoechoic lesions within the right hepatic lobe, measuring up to 2.4 x 2.9 x 1.8 cm. These lesions may reflect cysts and/or masses.  This was followed by CT scan of the abdomen pelvis with contrast that reported: Hepatic steatosis, multiple complex hepatic lesions, measuring higher than fluid density, some with somewhat irregular or nodular margins suspicious for hepatic abscesses in this patient with fever.A retrocecal tubular structure thought to probably represent appendix is thickened at 1.0 cm in diameter raising  possibility of appendicitis.  Patient evaluated by general surgery upon EDP request and felt acute appendicitis unlikely and recommended empiric treatment with IV antibiotics and GI evaluation for liver findings.  EDP has consulted GI, evaluation pending.  So far patient has received IV Zofran, IV fentanyl, 1 L IV fluids and IV Rocephin/Flagyl.  Review of Systems: As per HPI, otherwise review of systems negative.    Past Medical History:  Diagnosis Date   Allergy    vasomotor rhinitis   GERD (gastroesophageal reflux disease)    Narcolepsy     Past Surgical History:  Procedure Laterality Date   VASECTOMY      Social history:  reports that he has never smoked. He has never used smokeless tobacco. He reports current alcohol use of about 14.0 standard drinks of alcohol per week. He reports that he does not use drugs.   No Known Allergies  Family History  Problem Relation Age of Onset   Cancer Mother        Breast   Dementia Mother    Heart disease Father    Colon polyps Father    Heart disease Brother [phone]       MI   Parkinson's disease Maternal Grandfather    Diabetes Paternal Grandmother    Stroke Paternal Grandmother    Heart disease Paternal Grandfather    Colon cancer Neg Hx    Esophageal cancer Neg Hx    Rectal cancer Neg Hx    Stomach cancer Neg Hx       Prior  to Admission medications   Medication Sig Start Date End Date Taking? Authorizing Provider  fluticasone (FLONASE SENSIMIST) 27.5 MCG/SPRAY nasal spray Place 2 sprays into the nose daily. 01/30/22   Loyola Mast, MD  Glucosamine-Chondroit-Vit C-Mn (GLUCOSAMINE CHONDR 500 COMPLEX PO) Take by mouth.    [provider]  ibuprofen (ADVIL) 800 MG tablet Take 1 tablet (800 mg total) by mouth every 8 (eight) hours as needed. 05/06/22   Garnette Gunner, MD  METAMUCIL FIBER PO Take by mouth.    [provider]  methylphenidate (RITALIN) 10 MG tablet Take 1 tablet (10 mg total) by mouth 2 (two) times  daily. 04/02/22   Loyola Mast, MD  Multiple Vitamin (MULTIVITAMIN) tablet Take 1 tablet by mouth daily.    [provider]  Omega-3 Fatty Acids (FISH OIL) 1000 MG CAPS Take by mouth.    [provider]  omeprazole (PRILOSEC OTC) 20 MG tablet Take 20 mg by mouth daily.    [provider]    Physical Exam: Vitals:   05/09/22 0531 05/09/22 0823 05/09/22 0856 05/09/22 1204  BP: 128/78  118/70 134/70  Pulse: (!) 134 94 (!) 107 79  Resp: 18  20 18   Temp: 99.7 F (37.6 C)   99.1 F (37.3 C)  TempSrc: Oral   Oral  SpO2: 96%  93% 94%    Constitutional: NAD, calm, comfortable Eyes: PERRL, lids and conjunctivae normal ENMT: Mucous membranes are moist. Posterior pharynx clear of any exudate or lesions.Normal dentition.  Neck: normal, supple, no masses, no thyromegaly Respiratory: clear to auscultation bilaterally, no wheezing, no crackles. Normal respiratory effort. No accessory muscle use.  Cardiovascular: Regular rhythm, tachycardic, no murmurs / rubs / gallops. No extremity edema. 2+ pedal pulses. No carotid bruits.  Abdomen: no epigastric or right upper quadrant or lower quadrant tenderness on exam, no masses palpated. No hepatosplenomegaly. Bowel sounds positive.  Musculoskeletal: no clubbing / cyanosis. No joint deformity upper and lower extremities. Good ROM, no contractures. Normal muscle tone.  Neurologic: CN 2-12 grossly intact. Sensation intact, DTR normal. Strength 5/5 in all 4.  Psychiatric: Normal judgment and insight. Alert and oriented x 3. Normal mood.  SKIN/catheters: no rashes, lesions, ulcers. No induration  Labs on Admission: I have personally reviewed following labs and imaging studies  CBC: Recent Labs  Lab 05/09/22 0556  WBC 14.3*  HGB 13.3  HCT 38.9*  MCV 90.3  PLT 159   Basic Metabolic Panel: Recent Labs  Lab 05/09/22 0556  NA 135  K 3.2*  CL 99  CO2 22  GLUCOSE 171*  BUN 10  CREATININE 1.04  CALCIUM 8.4*    GFR: Estimated Creatinine Clearance: 101.6 mL/min (by C-G formula based on SCr of 1.04 mg/dL). Recent Labs  Lab 05/09/22 0556  WBC 14.3*   Liver Function Tests: Recent Labs  Lab 05/09/22 0556  AST 101*  ALT 308*  ALKPHOS 242*  BILITOT 3.5*  PROT 7.0  ALBUMIN 2.9*   Recent Labs  Lab 05/09/22 0556  LIPASE 23   No results for input(s): "AMMONIA" in the last 168 hours. Coagulation Profile: No results for input(s): "INR", "PROTIME" in the last 168 hours. Cardiac Enzymes: No results for input(s): "CKTOTAL", "CKMB", "CKMBINDEX", "TROPONINI" in the last 168 hours. BNP (last 3 results) No results for input(s): "PROBNP" in the last 8760 hours. HbA1C: No results for input(s): "HGBA1C" in the last 72 hours. CBG: No results for input(s): "GLUCAP" in the last 168 hours. Lipid Profile: No results  for input(s): "CHOL", "HDL", "LDLCALC", "TRIG", "CHOLHDL", "LDLDIRECT" in the last 72 hours. Thyroid Function Tests: No results for input(s): "TSH", "T4TOTAL", "FREET4", "T3FREE", "THYROIDAB" in the last 72 hours. Anemia Panel: No results for input(s): "VITAMINB12", "FOLATE", "FERRITIN", "TIBC", "IRON", "RETICCTPCT" in the last 72 hours. Urine analysis:    Component Value Date/Time   COLORURINE AMBER (A) 05/09/2022 0556   APPEARANCEUR HAZY (A) 05/09/2022 0556   LABSPEC 1.027 05/09/2022 0556   PHURINE 5.0 05/09/2022 0556   GLUCOSEU NEGATIVE 05/09/2022 0556   HGBUR SMALL (A) 05/09/2022 0556   HGBUR negative 03/05/2010 1006   BILIRUBINUR MODERATE (A) 05/09/2022 0556   BILIRUBINUR Negative 08/23/2018 1158   KETONESUR NEGATIVE 05/09/2022 0556   PROTEINUR 100 (A) 05/09/2022 0556   UROBILINOGEN 0.2 08/23/2018 1158   UROBILINOGEN 0.2 03/05/2010 1006   NITRITE NEGATIVE 05/09/2022 0556   LEUKOCYTESUR NEGATIVE 05/09/2022 0556    Radiological Exams on Admission: Personally reviewed  CT ABDOMEN PELVIS W CONTRAST  Result Date: 05/09/2022 CLINICAL DATA:  Nausea and vomiting.  Fever.  EXAM: CT ABDOMEN AND PELVIS WITH CONTRAST TECHNIQUE: Multidetector CT imaging of the abdomen and pelvis was performed using the standard protocol following bolus administration of intravenous contrast. RADIATION DOSE REDUCTION: This exam was performed according to the departmental dose-optimization program which includes automated exposure control, adjustment of the mA and/or kV according to patient size and/or use of iterative reconstruction technique. CONTRAST:  OMNIPAQUE IOHEXOL 300 MG/ML  SOLN COMPARISON:  Abdominal ultrasound 05/09/2022 FINDINGS: Lower chest: Dependent subsegmental atelectasis in both lower lobes. Hepatobiliary: Suspected hepatic steatosis. Scattered hypodense renal lesions are present, these have greater than fluid density and some have marginal irregularity or nodularity such as the hypodense lesion on image 21 of series 3 which measures 2.4 by 1.5 cm, and the hypodense lesion on image 32 of series 3 which measures 3.9 by 2.5 cm. Accordingly these lesions are nonspecific, hepatic protocol MRI with and without contrast is once again recommended for definitive characterization. The possibility of hepatic abscesses is not excluded in this patient with fever although the seem to have less marginal enhancement than I would expect for abscesses. No biliary dilatation. Pancreas: Unremarkable Spleen: Unremarkable Adrenals/Urinary Tract: Unremarkable Stomach/Bowel: The appendix is poorly seen but appears to extend from the cecum on images 49 through 57 of series 7, this structure has a diameter of 1.0 cm reason the possibility of acute appendicitis. The distal margin of the structures obscured by bowel loops. Vascular/Lymphatic: Unremarkable Reproductive: Unremarkable Other: No supplemental non-categorized findings. Musculoskeletal: Likely physiologic anterior wedging at T11. IMPRESSION: 1. Multiple complex hepatic lesions, measuring higher than fluid density, some with somewhat irregular or  nodular margins. Hepatic abscesses are a possibility in this patient with fever. Other types of mass cannot be excluded. Metastatic disease to the liver is not completely excluded. Recommend hepatic protocol MRI with and without contrast. 2. A retrocecal tubular structure thought to probably represent appendix is thickened at 1.0 cm in diameter. The distal margin of this structure is obscured by adjacent loops of bowel. The appearance raises suspicion for potential acute appendicitis. Correlate with right lower quadrant tenderness or other characteristic physical exam findings. 3. Hepatic steatosis. Electronically Signed   By: Gaylyn Rong M.D.   On: 05/09/2022 11:57   US Abdomen Limited RUQ (LIVER/GB)  Result Date: 05/09/2022 CLINICAL DATA:  Provided history: Pain. Jaundice. EXAM: ULTRASOUND ABDOMEN LIMITED RIGHT UPPER QUADRANT COMPARISON:  No pertinent prior exams available for comparison. FINDINGS: Gallbladder: No gallstones or wall thickening visualized. No  sonographic Eulah Pont sign noted by sonographer. Common bile duct: Diameter: 2-3 mm, within normal limits. Liver: There are multiple round to ovoid hypoechoic lesions within the right hepatic lobe, measuring 2.4 x 2.9 x 1.8 cm, 1.5 x 2.1 x 1.4 cm, 2.5 x 1.6 x 2.3 cm and 1.5 x 1.3 x 1.6 cm. There is background increased hepatic parenchymal echogenicity. Portal vein is patent on color Doppler imaging with normal direction of blood flow towards the liver. Impression #1 will be called to the ordering clinician or representative by the Radiologist Assistant, and communication documented in the PACS or [employer]. IMPRESSION: 1. Multiple round to ovoid hypoechoic lesions within the right hepatic lobe, measuring up to 2.4 x 2.9 x 1.8 cm. These lesions may reflect cysts and/or masses. A liver protocol abdominal MRI without and with contrast is recommended for further evaluation. 2. Background increased hepatic parenchymal echogenicity. This is a  nonspecific finding, which may be seen in the setting of hepatic steatosis or other chronic hepatic parenchymal disease. 3. Otherwise unremarkable right upper quadrant ultrasound, as described. Electronically Signed   By: Jackey Loge D.O.   On: 05/09/2022 10:33   DG Chest 2 View  Result Date: 05/09/2022 CLINICAL DATA:  A 51 year old male presents with fever. EXAM: CHEST - 2 VIEW COMPARISON:  None Available. FINDINGS: Trachea midline. Cardiomediastinal contours and hilar structures are normal. No sign of pleural effusion. On limited assessment no acute skeletal findings. There is a fracture of the distal third of the LEFT clavicle which may be subacute or chronic. IMPRESSION: 1. No acute cardiopulmonary disease. 2. Fracture of the distal third of the LEFT clavicle which may be subacute or chronic. Correlate with any pain in this area with patient history. Electronically Signed   By: Donzetta Kohut M.D.   On: 05/09/2022 09:23    EKG: Independently reviewed.  Sinus tachycardia with rate related ST changes in anterior leads     Assessment and Plan:   Principal Problem:   Sepsis (HCC) Active Problems:   Narcolepsy without cataplexy   Gastroesophageal reflux disease   Hypertriglyceridemia   Alcohol use    1.  Fevers/Sepsis: Patient  tachycardic, Tmax here so far is 99.43F and 103F at home along with leukocytosis 14 K and T bili >2.  Source could be liver versus gut versus viral etiology.  Will treat with sepsis protocol (ordered procalcitonin and lactic acid) with IV fluids, empiric antibiotics, obtain blood cultures and trend fevers, white count.  General surgery evaluated patient in the ED and impression as above.  Pending GI evaluation.  If procalcitonin low, can consider viral panel.  Recent COVID/flu test by PCP resulted negative.  UA not indicative of UTI.  Patient denies any diarrhea.  Will follow-up GI recommendations regarding CT findings of possible liver abscesses and need for IR  drainage/diagnosis vs colonoscopy ( as imaging suggests liver findings could be malignancy)  Will keep n.p.o. for now and consider starting liquid diet if okay per GI  2.  Elevated liver enzymes with AST 101, ALT 308, ALP 242, albumin 2.9, T. bili 3.5, lipase normal at 23.  USG and CT findings as mentioned above. Acetaminophen level nl. Will use Ibuprofen for fevers for now. Not on statins. Avoid other hepatotoxins and check hepatitis profile.  Patient does report moderate alcohol use but never had signs of withdrawal.  Imaging studies do reveal hepatic steatosis.    3. GERD: On Prilosec at home.  Will give IV PPI while here.  4.  Hypertriglyceridemia: Resume fish oil.  Last lipid profile done in March, acceptable  5.  Deviated nasal septum and chronic nasal congestion: Uses Flonase as needed  6.  Hypokalemia: Patient received IV replacement in the ED.  May not tolerate p.o.  Will add potassium to continuous IV fluids.  Repeat labs checked this evening.  7. Narcolepsy: Resume Ritalin  DVT prophylaxis: SCDs as might need procedures  COVID screen: Negative  Code Status: Full code.Health care proxy would be his wife Journalist, newspaper Communication: Discussed with patient and all questions answered to satisfaction.  Consults called: General surgery and GI ([facility]) consulted by EDP Admission status :Patient will be admitted under OBSERVATION status for now.The patient's presenting symptoms, physical exam findings, and initial radiographic and laboratory data in the context of their medical condition is felt to be of moderate risk and imaging findings of questionable significance.  If patient continues to decline or does not improve with short stay, may need to upgrade to inpatient status.       Alessandra Bevels MD [facility] Pager in [place]  If 7PM-7AM, please contact night-coverage [email or web address]   05/09/2022, 3:17 PM

## 2022-05-09 NOTE — Consult Note (Signed)
Consultation  Referring Provider: [facility] Primary Care Physician:  Haydee Salter, MD Primary Gastroenterologist: Dr. Loletha Carrow  Reason for Consultation: Fever, abnormal abdominal imaging  HPI: Samuel Patterson is a 51 y.o. male, generally in good health with history of narcolepsy.  Known to Dr. Loletha Carrow from screening colonoscopy done January 2023 showed a few left colon diverticuli and internal hemorrhoids, no polyps. Patient presented to the emergency room today with 6-day history of fever 10 1-1 03 at home, this has been associated with rigors, and general sense of feeling unwell.  He says symptoms started last weekend on Saturday with fatigue and generalized weakness.  He has not had much of an appetite and has not been eating well.  He did have an episode of nausea and vomiting today.  No cough or congestion, no sore throat, no shortness of breath, no diarrhea.  He had been tested as an outpatient for COVID-19 and influenza and was negative. No complaints of abdominal pain. No other family members ill no known infectious exposures  Work-up in the ER showed WBC of 14.3/hemoglobin 13/hematocrit 38.9, lipase normal Potassium 3.7 Creatinine 1.04 T. bili 3.5/alk phos 242/AST 101/ALT 368 proCalcitonin and lactate pending.  Chest x-ray showed a left clavicle fracture probably subacute Upper abdominal ultrasound no gallstones, CBD 2 to 3 mm, there were multiple hepatic lesions in the right lobe of the liver cysts versus masses, there is a background increased echogenicity.  CT of the abdomen/ pelvis with contrast showed suspected hepatic steatosis, scattered hypodense liver lesions present greater than fluid density some have marginal irregularity or nodularity, largest 3.9 x 2.5 cm, nonspecific, possibility of hepatic abscess is not excluded though seems to have less marginal enhancement than would expect for abscesses, no biliary dilation A retrocecal tubular structure thought to probably  represent appendix is thickened at 1 cm in diameter distal margin of the structure obscured by adjacent loops of bowel on the raises suspicion for potential acute appendicitis.  Patient has been started on IV Rocephin and IV metronidazole.  Past Medical History:  Diagnosis Date   Allergy    vasomotor rhinitis   GERD (gastroesophageal reflux disease)    Narcolepsy     Past Surgical History:  Procedure Laterality Date   VASECTOMY      Prior to Admission medications   Medication Sig Start Date End Date Taking? Authorizing Provider  Calcium Carbonate Antacid (TUMS PO) Take 1 tablet by mouth 2 (two) times daily as needed (acid reflux).   Yes [provider]  fluticasone (FLONASE SENSIMIST) 27.5 MCG/SPRAY nasal spray Place 2 sprays into the nose daily. 01/30/22  Yes RuddLillette Boxer, MD  Glucosamine-Chondroit-Vit C-Mn (GLUCOSAMINE CHONDR 500 COMPLEX PO) Take 500 mg by mouth daily.   Yes [provider]  ibuprofen (ADVIL) 200 MG tablet Take 400 mg by mouth See admin instructions. Take 400 mg by mouth 1-3 times a day as needed for pain   Yes [provider]  ibuprofen (ADVIL) 800 MG tablet Take 1 tablet (800 mg total) by mouth every 8 (eight) hours as needed. Patient taking differently: Take 800 mg by mouth daily as needed for fever. 05/06/22  Yes Bonnita Hollow, MD  METAMUCIL FIBER PO Take 1 Scoop by mouth daily.   Yes [provider]  methylphenidate (RITALIN) 10 MG tablet Take 1 tablet (10 mg total) by mouth 2 (two) times daily. Patient taking differently: Take 10 mg by mouth daily. 04/02/22  Yes Haydee Salter, MD  Multiple Vitamin (MULTIVITAMIN) tablet Take 2 tablets by mouth daily.   Yes [provider]  Omega-3 Fatty Acids (FISH OIL) 1000 MG CAPS Take 1,000 mg by mouth daily.   Yes [provider]  omeprazole (PRILOSEC OTC) 20 MG tablet Take 20 mg by mouth every other day.   Yes [provider]  Phenylephrine HCl (SINEX  REGULAR NA) Place 1 spray into the nose in the morning and at bedtime.   Yes [provider]    Current Facility-Administered Medications  Medication Dose Route Frequency Provider Last Rate Last Admin   metroNIDAZOLE (FLAGYL) IVPB 500 mg  500 mg Intravenous Q12H Janeece Fitting, PA-C       Current Outpatient Medications  Medication Sig Dispense Refill   Calcium Carbonate Antacid (TUMS PO) Take 1 tablet by mouth 2 (two) times daily as needed (acid reflux).     fluticasone (FLONASE SENSIMIST) 27.5 MCG/SPRAY nasal spray Place 2 sprays into the nose daily. 10 g 12   Glucosamine-Chondroit-Vit C-Mn (GLUCOSAMINE CHONDR 500 COMPLEX PO) Take 500 mg by mouth daily.     ibuprofen (ADVIL) 200 MG tablet Take 400 mg by mouth See admin instructions. Take 400 mg by mouth 1-3 times a day as needed for pain     ibuprofen (ADVIL) 800 MG tablet Take 1 tablet (800 mg total) by mouth every 8 (eight) hours as needed. (Patient taking differently: Take 800 mg by mouth daily as needed for fever.) 30 tablet 0   METAMUCIL FIBER PO Take 1 Scoop by mouth daily.     methylphenidate (RITALIN) 10 MG tablet Take 1 tablet (10 mg total) by mouth 2 (two) times daily. (Patient taking differently: Take 10 mg by mouth daily.) 60 tablet 0   Multiple Vitamin (MULTIVITAMIN) tablet Take 2 tablets by mouth daily.     Omega-3 Fatty Acids (FISH OIL) 1000 MG CAPS Take 1,000 mg by mouth daily.     omeprazole (PRILOSEC OTC) 20 MG tablet Take 20 mg by mouth every other day.     Phenylephrine HCl (SINEX REGULAR NA) Place 1 spray into the nose in the morning and at bedtime.      Allergies as of 05/09/2022   (No Known Allergies)    Family History  Problem Relation Age of Onset   Cancer Mother        Breast   Dementia Mother    Heart disease Father    Colon polyps Father    Heart disease Brother 64       MI   Parkinson's disease Maternal Grandfather    Diabetes Paternal Grandmother    Stroke Paternal Grandmother    Heart  disease Paternal Grandfather    Colon cancer Neg Hx    Esophageal cancer Neg Hx    Rectal cancer Neg Hx    Stomach cancer Neg Hx     Social History   Socioeconomic History   Marital status: Married    Spouse name: Not on file   Number of children: Not on file   Years of education: Not on file   Highest education level: Not on file  Occupational History   Occupation: Technology/phones    Employer: [employer]   Occupation: Psychologist, sport and exercise  Tobacco Use   Smoking status: Never   Smokeless tobacco: Never  Vaping Use   Vaping Use: Never used  Substance and Sexual Activity   Alcohol use: Yes    Alcohol/week: 14.0 standard drinks of alcohol    Types: 14 Cans of  beer per week    Comment: 2 beers daily   Drug use: No   Sexual activity: Yes  Other Topics Concern   Not on file  Social History Narrative   Not on file   Social Determinants of Health   Financial Resource Strain: Not on file  Food Insecurity: Not on file  Transportation Needs: Not on file  Physical Activity: Not on file  Stress: Not on file  Social Connections: Not on file  Intimate Partner Violence: Not on file    Review of Systems: Pertinent positive and negative review of systems were noted in the above HPI section.  All other review of systems was otherwise negative.   Physical Exam: Vital signs in last 24 hours: Temp:  [99.1 F (37.3 C)-99.7 F (37.6 C)] 99.1 F (37.3 C) (06/16 1204) Pulse Rate:  [79-134] 79 (06/16 1204) Resp:  [18-20] 18 (06/16 1204) BP: (118-134)/(70-78) 134/70 (06/16 1204) SpO2:  [93 %-96 %] 94 % (06/16 1204)   General:   Alert,  Well-developed, well-nourished, ill-appearing white male pleasant and cooperative in NAD, family at bedside febrile Head:  Normocephalic and atraumatic. Eyes:  Sclera clear, no icterus.   Conjunctiva pink. Ears:  Normal auditory acuity. Nose:  No deformity, discharge,  or lesions. Mouth:  No deformity or lesions.   Neck:  Supple; no masses or  thyromegaly. Lungs:  Clear throughout to auscultation.   No wheezes, crackles, or rhonchi.  Heart:  tachyRegular rate and rhythm; no murmurs, clicks, rubs,  or gallops. Abdomen:  Soft, no appreciable abdominal tenderness, BS active,nonpalp mass or hsm.   Rectal:  not done Msk:  Symmetrical without gross deformities. . Pulses:  Normal pulses noted. Extremities:  Without clubbing or edema. Neurologic:  Alert and  oriented x4;  grossly normal neurologically. Skin:  Intact without significant lesions or rashes.. Psych:  Alert and cooperative. Normal mood and affect.  Intake/Output from previous day: No intake/output data recorded. Intake/Output this shift: No intake/output data recorded.  Lab Results: Recent Labs    05/09/22 0556  WBC 14.3*  HGB 13.3  HCT 38.9*  PLT 159   BMET Recent Labs    05/09/22 0556  NA 135  K 3.2*  CL 99  CO2 22  GLUCOSE 171*  BUN 10  CREATININE 1.04  CALCIUM 8.4*   LFT Recent Labs    05/09/22 0556  PROT 7.0  ALBUMIN 2.9*  AST 101*  ALT 308*  ALKPHOS 242*  BILITOT 3.5*   PT/INR No results for input(s): "LABPROT", "INR" in the last 72 hours. Hepatitis Panel Recent Labs    05/09/22 0925  HEPBSAG NON REACTIVE  HCVAB PENDING  HEPAIGM PENDING  HEPBIGM PENDING      IMPRESSION:  #67 51 year old white male with 6-day history of acute illness onset with generalized malaise, and fevers which have been persistent 101-103 at home, 1 episode of nausea and vomiting today No other localizing symptoms, specifically denies any abdominal pain or diarrhea COVID and influenza negative as an outpatient  Leukocytosis and elevated LFTs here Imaging with ultrasound and then CT of the abdomen and pelvis showing multiple complex hepatic lesions measuring higher than fluid density with somewhat irregular nodular margins, cannot rule out hepatic abscesses, metastatic disease. Also has a retrocecal tubular structure thought to probably represent the  appendix thickened at 1 cm, distal margin obscured cannot exclude appendicitis.  #2 hypokalemia #3 narcolepsy #4 diverticulosis  PLAN: Broad-spectrum antibiotic coverage We will schedule for MRI of the abdomen/pelvis Surgery is  on board Blood cultures drawn Add sed rate/CRP Further recommendations pending findings at MRI    [place] PA-C 05/09/2022, 2:54 PM

## 2022-05-09 NOTE — ED Provider Notes (Signed)
[facility] EMERGENCY DEPARTMENT Provider Note   CSN: [id number] Arrival date & time: 05/09/22  1749     History  Chief Complaint  Patient presents with   Fever   Vomiting    Samuel Patterson is a 51 y.o. male.  51 year old male with no past medical history presents to the ED with a chief complaint of fever for the past week.  Patient reports fever began approximately last Saturday, had a Tmax of 101 then, began to feel some decrease in p.o. intake.  Evaluated by PCP on Tuesday who tested him for COVID and flu which were negative.  He reports since then he is continued to feel overall worse, did have some Motrin 800 mg which helped with the symptoms temporarily.  He is endorsing decrease in p.o. intake, did take some Alka-Seltzer prior to arrival which made him vomit.  This was nonbloody, nonbilious.  He denies any other symptom.  He also reported a fever of 103 prior to arrival in the ED, began to have several chills, worsening symptoms.  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  No prior surgical intervention of his abdomen.  Non-smoker.  No chest pain.No tick bites.   The history is provided by the patient and medical records.  Fever Max temp prior to arrival:  103 Temp source:  Oral Severity:  Moderate Onset quality:  Gradual Duration:  1 week Timing:  Constant Progression:  Worsening Chronicity:  New Relieved by:  Ibuprofen Worsened by:  Nothing Associated symptoms: nausea and vomiting   Associated symptoms: no chest pain and no headaches        Home Medications Prior to Admission medications   Medication Sig Start Date End Date Taking? Authorizing Provider  fluticasone (FLONASE SENSIMIST) 27.5 MCG/SPRAY nasal spray Place 2 sprays into the nose daily. 01/30/22   Loyola Mast, MD  Glucosamine-Chondroit-Vit C-Mn (GLUCOSAMINE CHONDR 500 COMPLEX PO) Take by mouth.    [provider]  ibuprofen (ADVIL)  800 MG tablet Take 1 tablet (800 mg total) by mouth every 8 (eight) hours as needed. 05/06/22   Garnette Gunner, MD  METAMUCIL FIBER PO Take by mouth.    [provider]  methylphenidate (RITALIN) 10 MG tablet Take 1 tablet (10 mg total) by mouth 2 (two) times daily. 04/02/22   Loyola Mast, MD  Multiple Vitamin (MULTIVITAMIN) tablet Take 1 tablet by mouth daily.    [provider]  Omega-3 Fatty Acids (FISH OIL) 1000 MG CAPS Take by mouth.    [provider]  omeprazole (PRILOSEC OTC) 20 MG tablet Take 20 mg by mouth daily.    [provider]      Allergies    Patient has no known allergies.    Review of Systems   Review of Systems  Constitutional:  Positive for fever.  Respiratory:  Negative for shortness of breath.   Cardiovascular:  Negative for chest pain.  Gastrointestinal:  Positive for nausea and vomiting. Negative for abdominal pain.  Genitourinary:  Negative for difficulty urinating and flank pain.  Musculoskeletal:  Negative for back pain.  Neurological:  Negative for weakness, light-headedness and headaches.  All other systems reviewed and are negative.   Physical Exam Updated Vital Signs BP 134/70 (BP Location: Right Arm)   Pulse 79   Temp 99.1 F (37.3 C) (Oral)   Resp 18   SpO2 94%  Physical Exam Vitals and nursing  note reviewed.  Constitutional:      Appearance: He is well-developed.  HENT:     Head: Normocephalic and atraumatic.  Eyes:     General: No scleral icterus.    Pupils: Pupils are equal, round, and reactive to light.  Cardiovascular:     Rate and Rhythm: Normal rate.     Heart sounds: Normal heart sounds.  Pulmonary:     Effort: Pulmonary effort is normal.     Breath sounds: Normal breath sounds. No wheezing.  Chest:     Chest wall: No tenderness.  Abdominal:     General: Bowel sounds are normal. There is no distension.     Palpations: Abdomen is soft.     Tenderness: There is no abdominal tenderness.   Musculoskeletal:        General: No tenderness or deformity.     Cervical back: Normal range of motion.  Skin:    General: Skin is warm and dry.     Comments: NO rashes or erythema noted along the upper and lower extremities.   Neurological:     Mental Status: He is alert and oriented to person, place, and time.     ED Results / Procedures / Treatments   Labs (all labs ordered are listed, but only abnormal results are displayed) Labs Reviewed  COMPREHENSIVE METABOLIC PANEL - Abnormal; Notable for the following components:      Result Value   Potassium 3.2 (*)    Glucose, Bld 171 (*)    Calcium 8.4 (*)    Albumin 2.9 (*)    AST 101 (*)    ALT 308 (*)    Alkaline Phosphatase 242 (*)    Total Bilirubin 3.5 (*)    All other components within normal limits  CBC - Abnormal; Notable for the following components:   WBC 14.3 (*)    HCT 38.9 (*)    All other components within normal limits  URINALYSIS, ROUTINE W REFLEX MICROSCOPIC - Abnormal; Notable for the following components:   Color, Urine AMBER (*)    APPearance HAZY (*)    Hgb urine dipstick SMALL (*)    Bilirubin Urine MODERATE (*)    Protein, ur 100 (*)    All other components within normal limits  SARS CORONAVIRUS 2 BY RT PCR  CULTURE, BLOOD (ROUTINE X 2)  CULTURE, BLOOD (ROUTINE X 2)  LIPASE, BLOOD  HEPATITIS PANEL, ACUTE  ACETAMINOPHEN LEVEL  LACTIC ACID, PLASMA  LACTIC ACID, PLASMA    EKG None  Radiology CT ABDOMEN PELVIS W CONTRAST  Result Date: 05/09/2022 CLINICAL DATA:  Nausea and vomiting.  Fever. EXAM: CT ABDOMEN AND PELVIS WITH CONTRAST TECHNIQUE: Multidetector CT imaging of the abdomen and pelvis was performed using the standard protocol following bolus administration of intravenous contrast. RADIATION DOSE REDUCTION: This exam was performed according to the departmental dose-optimization program which includes automated exposure control, adjustment of the mA and/or kV according to patient size and/or  use of iterative reconstruction technique. CONTRAST:  OMNIPAQUE IOHEXOL 300 MG/ML  SOLN COMPARISON:  Abdominal ultrasound 05/09/2022 FINDINGS: Lower chest: Dependent subsegmental atelectasis in both lower lobes. Hepatobiliary: Suspected hepatic steatosis. Scattered hypodense renal lesions are present, these have greater than fluid density and some have marginal irregularity or nodularity such as the hypodense lesion on image 21 of series 3 which measures 2.4 by 1.5 cm, and the hypodense lesion on image 32 of series 3 which measures 3.9 by 2.5 cm. Accordingly these lesions are nonspecific, hepatic protocol  MRI with and without contrast is once again recommended for definitive characterization. The possibility of hepatic abscesses is not excluded in this patient with fever although the seem to have less marginal enhancement than I would expect for abscesses. No biliary dilatation. Pancreas: Unremarkable Spleen: Unremarkable Adrenals/Urinary Tract: Unremarkable Stomach/Bowel: The appendix is poorly seen but appears to extend from the cecum on images 49 through 57 of series 7, this structure has a diameter of 1.0 cm reason the possibility of acute appendicitis. The distal margin of the structures obscured by bowel loops. Vascular/Lymphatic: Unremarkable Reproductive: Unremarkable Other: No supplemental non-categorized findings. Musculoskeletal: Likely physiologic anterior wedging at T11. IMPRESSION: 1. Multiple complex hepatic lesions, measuring higher than fluid density, some with somewhat irregular or nodular margins. Hepatic abscesses are a possibility in this patient with fever. Other types of mass cannot be excluded. Metastatic disease to the liver is not completely excluded. Recommend hepatic protocol MRI with and without contrast. 2. A retrocecal tubular structure thought to probably represent appendix is thickened at 1.0 cm in diameter. The distal margin of this structure is obscured by adjacent loops of  bowel. The appearance raises suspicion for potential acute appendicitis. Correlate with right lower quadrant tenderness or other characteristic physical exam findings. 3. Hepatic steatosis. Electronically Signed   By: Gaylyn Rong M.D.   On: 05/09/2022 11:57   US Abdomen Limited RUQ (LIVER/GB)  Result Date: 05/09/2022 CLINICAL DATA:  Provided history: Pain. Jaundice. EXAM: ULTRASOUND ABDOMEN LIMITED RIGHT UPPER QUADRANT COMPARISON:  No pertinent prior exams available for comparison. FINDINGS: Gallbladder: No gallstones or wall thickening visualized. No sonographic Murphy sign noted by sonographer. Common bile duct: Diameter: 2-3 mm, within normal limits. Liver: There are multiple round to ovoid hypoechoic lesions within the right hepatic lobe, measuring 2.4 x 2.9 x 1.8 cm, 1.5 x 2.1 x 1.4 cm, 2.5 x 1.6 x 2.3 cm and 1.5 x 1.3 x 1.6 cm. There is background increased hepatic parenchymal echogenicity. Portal vein is patent on color Doppler imaging with normal direction of blood flow towards the liver. Impression #1 will be called to the ordering clinician or representative by the Radiologist Assistant, and communication documented in the PACS or [employer]. IMPRESSION: 1. Multiple round to ovoid hypoechoic lesions within the right hepatic lobe, measuring up to 2.4 x 2.9 x 1.8 cm. These lesions may reflect cysts and/or masses. A liver protocol abdominal MRI without and with contrast is recommended for further evaluation. 2. Background increased hepatic parenchymal echogenicity. This is a nonspecific finding, which may be seen in the setting of hepatic steatosis or other chronic hepatic parenchymal disease. 3. Otherwise unremarkable right upper quadrant ultrasound, as described. Electronically Signed   By: Jackey Loge D.O.   On: 05/09/2022 10:33   DG Chest 2 View  Result Date: 05/09/2022 CLINICAL DATA:  A 51 year old male presents with fever. EXAM: CHEST - 2 VIEW COMPARISON:  None Available. FINDINGS:  Trachea midline. Cardiomediastinal contours and hilar structures are normal. No sign of pleural effusion. On limited assessment no acute skeletal findings. There is a fracture of the distal third of the LEFT clavicle which may be subacute or chronic. IMPRESSION: 1. No acute cardiopulmonary disease. 2. Fracture of the distal third of the LEFT clavicle which may be subacute or chronic. Correlate with any pain in this area with patient history. Electronically Signed   By: Donzetta Kohut M.D.   On: 05/09/2022 09:23    Procedures Procedures    Medications Ordered in ED Medications  metroNIDAZOLE (FLAGYL)  IVPB 500 mg (has no administration in time range)  ondansetron (ZOFRAN) injection 4 mg (4 mg Intravenous Given 05/09/22 0850)  sodium chloride 0.9 % bolus 1,000 mL (1,000 mLs Intravenous New Bag/Given 05/09/22 0850)  fentaNYL (SUBLIMAZE) injection 50 mcg (50 mcg Intravenous Given 05/09/22 0900)  potassium chloride 10 mEq in 100 mL IVPB (10 mEq Intravenous New Bag/Given 05/09/22 1013)  iohexol (OMNIPAQUE) 300 MG/ML solution 100 mL (100 mLs Intravenous Contrast Given 05/09/22 1147)  cefTRIAXone (ROCEPHIN) 2 g in sodium chloride 0.9 % 100 mL IVPB (2 g Intravenous New Bag/Given 05/09/22 1346)    ED Course/ Medical Decision Making/ A&P                           Medical Decision Making Amount and/or Complexity of Data Reviewed Labs: ordered. Radiology: ordered.  Risk Prescription drug management.   This patient presents to the ED for concern of fever, this involves a number of treatment options, and is a complaint that carries with it a high risk of complications and morbidity.  The differential diagnosis includes infection viral versus bacterial.    Co morbidities: Discussed in HPI   Brief History:  Patient here with 1 week of FUO, no localization of pain. Negative respiratory panels x 2. Did have nausea and vomiting prior to arrival.   EMR reviewed including pt PMHx, past surgical history  and past visits to ER.   See HPI for more details   Lab Tests:  I ordered and independently interpreted labs.  The pertinent results include:    Labs notable forCMP with some hypokalemia at 3.2, creatine is within normal limits. LFTs are elevated, has been taking nyquikl since Sunday. AST 101, ALT 308 and a bilirubin of 3.5. Lipase is normal. UA without any infectious signs. Will check hepatitis panel along with blood cultures.    Imaging Studies:  CT abdomen and pelvis:  IMPRESSION:  1. Multiple complex hepatic lesions, measuring higher than fluid  density, some with somewhat irregular or nodular margins. Hepatic  abscesses are a possibility in this patient with fever. Other types  of mass cannot be excluded. Metastatic disease to the liver is not  completely excluded. Recommend hepatic protocol MRI with and without  contrast.  2. A retrocecal tubular structure thought to probably represent  appendix is thickened at 1.0 cm in diameter. The distal margin of  this structure is obscured by adjacent loops of bowel. The  appearance raises suspicion for potential acute appendicitis.  Correlate with right lower quadrant tenderness or other  characteristic physical exam findings.  3. Hepatic steatosis.   Cardiac Monitoring:  The patient was maintained on a cardiac monitor.  I personally viewed and interpreted the cardiac monitored which showed an underlying rhythm of: Sinus tachycardia 133 EKG non-ischemic   Medicines ordered:  I ordered medication including zofran, fentanyl,bolus  for symptomatic treatment Reevaluation of the patient after these medicines showed that the patient improved I have reviewed the patients home medicines and have made adjustments as needed   Critical Interventions:  After CT findings patient was started on IV antibiotics such as Flagyl along with Rocephin to help treat intra-abdominal infection.   Consults:  I requested consultation with Elmer SowMichael  M. PA General surgery,  and discussed lab and imaging findings as well as pertinent plan - they agreed to evaluate patient while in the ED. recommendations to treat for possible appendicitis via antibiotics.  Patient will likely need admission to the  hospital for further evaluation of their hepatic lesions.   Consultation placed to Gastroenterology Scheryl Marten PA,     Reevaluation:  After the interventions noted above I re-evaluated patient and found that they have :stayed the same   Social Determinants of Health:  The patient's social determinants of health were a factor in the care of this patient    Problem List / ED Course:  Patient here with FUO for the past week with a Tmax of 103.  Blood work in the ED remarkable for elevated LFTs, elevated total bilirubin.  Work-up with ultrasound shows some hepatic masses.  He did arrive tachycardic with a heart rate in the 130s.  CT abdomen was followed which shows some hepatic lesions versus appendicitis.  General surgery was contacted who recommended IV antibiotics along with a consultation with gastroenterology.  Gastroenterology was contacted [facility] who will evaluate patient while in the ED.  He remained stable.  He did have some fluids along with potassium replacement.  He is also receiving Rocephin along with Flagyl IV for possible appendicitis. Spoke to Dr. Lajuana Ripple who agreed with admission into the hospital.   Dispostion:  After consideration of the diagnostic results and the patients response to treatment, I feel that the patent would benefit from admission for further evaluation.    Portions of this note were generated with Scientist, clinical (histocompatibility and immunogenetics). Dictation errors may occur despite best attempts at proofreading.   Final Clinical Impression(s) / ED Diagnoses Final diagnoses:  Fever of unknown origin  Acute appendicitis, unspecified acute appendicitis type  Hepatic lesion    Rx / DC Orders ED Discharge Orders      None         Claude Manges, PA-C 05/09/22 1417    Gwyneth Sprout, MD 05/13/22 [phone]

## 2022-05-09 NOTE — Sepsis Progress Note (Signed)
Elink following code sepsis °

## 2022-05-09 NOTE — ED Triage Notes (Signed)
Pt states that he has been running a fever since Sun, went to PCP and has covid and flu test that was negative. Pt states that he has vomited several times as well, denies abd pain.

## 2022-05-09 NOTE — Consult Note (Signed)
Samuel Patterson 1971/06/19  [id number].    Requesting MD: Janeece Fitting, PA-C; attending Dr. Blanchie Dessert Chief Complaint/Reason for Consult: Fever  HPI: Samuel Patterson is a 51 y.o. male with an unremarkable PMhx who presented to the ED on 6/16 for fever.  Patient reports that he awoke on Saturday, 6/17, just not feeling well which she describes as fatigue, headache, dry cough and poor appetite. He began running a fever later that evening. He has been taking Nyquil cold and flu every 4 hours and Ibuprofen 840m every 8 hours since symptom onset. His fever and symptoms persisted over the weekend so he was evaluated by his PCP on Tuesday, 6/13, where he had a negative flu and COVID test. Felt slightly better on Wed morning, 6/14, and was able to go to work but later that evening fever and symptoms returned. Thursday, 6/15, began having chills and nausea with fever to 103. This morning he tried alk-seltzer for his nausea/symptoms and vomited shortly after. No further vomiting. Given persistence of his symptoms he presented to the ED for evaluation. Prior to symptom onset he reports he was in his normal state of health and felt well with no symptoms. He denies any recent weight loss. He denies any abdominal pain or diarrhea. He is having normal daily bm's with the last this am without melena or hematochezia. Denies hx of similar symptoms in the past. No hx of issues with liver or GB in the past. He drinks 2 beers daily and will occasionally drink 4 beers on the weekend but never more. No IVDU. No recent travel in the last 3 months (no recent travel outside of the country). No hx hepatitis. No sick contacts or persons with similar symptoms. No neck stiffness, sore throat, cp, sob, productive cough, rash, or urinary symptoms. No prior abdominal surgeries. He is not on blood thinners. He had a reassuring colonoscopy in Jan 2023 with a few small-mouthed diverticula were found in the left colon, some internal  hemorrhoids but was otherwise reassuring.   Here in the ED patient has been afebrile (Tmax 99.7), tachy to the 130's which improved after IVF, and without hypoxia or hypotension. WBC 14.3, Alk phos 242, AST 101, ALT 308, T. Bili 3.5 and normal lipase. RUQ [place]/ no cholelithiasis but Multiple round to ovoid hypoechoic lesions within the right hepatic lobe, measuring up to 2.4 x 2.9 x 1.8 cm. CT A/P showed multiple complex hepatic lesions, measuring higher than fluid density, some with somewhat irregular or nodular margins concerning for hepatic abscesses vs other types of masses such as metastatic disease to the liver. There was also a concern for possible acute appendicitis. We were asked to see.   ROS: Review of Systems  Constitutional:  Positive for chills, fever and malaise/fatigue. Negative for weight loss.  HENT:  Positive for congestion (chronic). Negative for sore throat.   Respiratory:  Positive for cough. Negative for sputum production, shortness of breath and wheezing.   Cardiovascular:  Negative for chest pain and leg swelling.  Gastrointestinal:  Positive for abdominal pain, nausea and vomiting. Negative for blood in stool, constipation, diarrhea and melena.  Genitourinary: Negative.   Musculoskeletal:  Negative for neck pain.  Skin:  Negative for rash.  Psychiatric/Behavioral:  Negative for substance abuse.     Family History  Problem Relation Age of Onset   Cancer Mother        Breast   Dementia Mother    Heart disease Father  Colon polyps Father    Heart disease Brother 19       MI   Parkinson's disease Maternal Grandfather    Diabetes Paternal Grandmother    Stroke Paternal Grandmother    Heart disease Paternal Grandfather    Colon cancer Neg Hx    Esophageal cancer Neg Hx    Rectal cancer Neg Hx    Stomach cancer Neg Hx     Past Medical History:  Diagnosis Date   Allergy    vasomotor rhinitis   GERD (gastroesophageal reflux disease)    Narcolepsy      Past Surgical History:  Procedure Laterality Date   VASECTOMY      Social History:  reports that he has never smoked. He has never used smokeless tobacco. He reports current alcohol use of about 14.0 standard drinks of alcohol per week. He reports that he does not use drugs.  Allergies: No Known Allergies  (Not in a hospital admission)    Physical Exam: Blood pressure 134/70, pulse 79, temperature 99.1 F (37.3 C), temperature source Oral, resp. rate 18, SpO2 94 %. General: pleasant, WD/WN white male who is laying in bed in NAD HEENT: head is normocephalic, atraumatic.  Sclera are noninjected.  PERRL.  Ears and nose without any masses or lesions.  Mouth is pink and moist. Dentition fair. No c-spine ttp and normal rom without pain. Heart: regular, rate, and rhythm.  Normal s1,s2. No obvious murmurs, gallops, or rubs noted.  Palpable pedal pulses bilaterally  Lungs: CTAB, no wheezes, rhonchi, or rales noted.  Respiratory effort nonlabored Abd: Soft, ND, he is completely NT on light and deep palpation. +BS. No obvious masses, hernias, or organomegaly MS: no BUE/BLE edema, calves soft and nontender Skin: warm and dry with no masses, lesions, or rashes Psych: A&Ox4 with an appropriate affect Neuro: cranial nerves grossly intact, normal speech, thought process intact, moves all extremities, gait not assessed   Results for orders placed or performed during the hospital encounter of 05/09/22 (from the past 48 hour(s))  SARS Coronavirus 2 by RT PCR (hospital order, performed in [facility] hospital lab) *cepheid single result test* Anterior Nasal Swab     Status: None   Collection Time: 05/09/22  5:43 AM   Specimen: Anterior Nasal Swab  Result Value Ref Range   SARS Coronavirus 2 by RT PCR NEGATIVE NEGATIVE    Comment: (NOTE) SARS-CoV-2 target nucleic acids are NOT DETECTED.  The SARS-CoV-2 RNA is generally detectable in upper and lower respiratory specimens during the acute phase  of infection. The lowest concentration of SARS-CoV-2 viral copies this assay can detect is 250 copies / mL. A negative result does not preclude SARS-CoV-2 infection and should not be used as the sole basis for treatment or other patient management decisions.  A negative result may occur with improper specimen collection / handling, submission of specimen other than nasopharyngeal swab, presence of viral mutation(s) within the areas targeted by this assay, and inadequate number of viral copies (<250 copies / mL). A negative result must be combined with clinical observations, patient history, and epidemiological information.  Fact Sheet for Patients:   https://www.patel.info/  Fact Sheet for Healthcare Providers: https://hall.com/  This test is not yet approved or  cleared by the [place] FDA and has been authorized for detection and/or diagnosis of SARS-CoV-2 by FDA under an Emergency Use Authorization (EUA).  This EUA will remain in effect (meaning this test can be used) for the duration of the COVID-19 declaration  under Section 564(b)(1) of the Act, 21 U.S.C. section 360bbb-3(b)(1), unless the authorization is terminated or revoked sooner.  Performed at [facility], [place] [address], [place], [place] [address]   Lipase, blood     Status: None   Collection Time: 05/09/22  5:56 AM  Result Value Ref Range   Lipase 23 11 - 51 U/L    Comment: Performed at [facility] [address] [place], [place] [address]  Comprehensive metabolic panel     Status: Abnormal   Collection Time: 05/09/22  5:56 AM  Result Value Ref Range   Sodium 135 135 - 145 mmol/L   Potassium 3.2 (L) 3.5 - 5.1 mmol/L   Chloride 99 98 - 111 mmol/L   CO2 22 22 - 32 mmol/L   Glucose, Bld 171 (H) 70 - 99 mg/dL    Comment: Glucose reference range applies only to samples taken after fasting for at least 8 hours.   BUN 10 6 - 20 mg/dL   Creatinine, Ser 1.04  0.61 - 1.24 mg/dL   Calcium 8.4 (L) 8.9 - 10.3 mg/dL   Total Protein 7.0 6.5 - 8.1 g/dL   Albumin 2.9 (L) 3.5 - 5.0 g/dL   AST 101 (H) 15 - 41 U/L   ALT 308 (H) 0 - 44 U/L   Alkaline Phosphatase 242 (H) 38 - 126 U/L   Total Bilirubin 3.5 (H) 0.3 - 1.2 mg/dL   GFR, Estimated >60 >60 mL/min    Comment: (NOTE) Calculated using the CKD-EPI Creatinine Equation (2021)    Anion gap 14 5 - 15    Comment: Performed at [facility], [place] [address], [place], [place] [address]  CBC     Status: Abnormal   Collection Time: 05/09/22  5:56 AM  Result Value Ref Range   WBC 14.3 (H) 4.0 - 10.5 K/uL   RBC 4.31 4.22 - 5.81 MIL/uL   Hemoglobin 13.3 13.0 - 17.0 g/dL   HCT 38.9 (L) 39.0 - 52.0 %   MCV 90.3 80.0 - 100.0 fL   MCH 30.9 26.0 - 34.0 pg   MCHC 34.2 30.0 - 36.0 g/dL   RDW 12.8 11.5 - 15.5 %   Platelets 159 150 - 400 K/uL   nRBC 0.0 0.0 - 0.2 %    Comment: Performed at [facility], [place] [address] [place], [place] [address]  Urinalysis, Routine w reflex microscopic Anterior Nasal Swab     Status: Abnormal   Collection Time: 05/09/22  5:56 AM  Result Value Ref Range   Color, Urine AMBER (A) YELLOW    Comment: BIOCHEMICALS MAY BE AFFECTED BY COLOR   APPearance HAZY (A) CLEAR   Specific Gravity, Urine 1.027 1.005 - 1.030   pH 5.0 5.0 - 8.0   Glucose, UA NEGATIVE NEGATIVE mg/dL   Hgb urine dipstick SMALL (A) NEGATIVE   Bilirubin Urine MODERATE (A) NEGATIVE   Ketones, ur NEGATIVE NEGATIVE mg/dL   Protein, ur 100 (A) NEGATIVE mg/dL   Nitrite NEGATIVE NEGATIVE   Leukocytes,Ua NEGATIVE NEGATIVE   WBC, UA 6-10 0 - 5 WBC/hpf   Bacteria, UA NONE SEEN NONE SEEN   Squamous Epithelial / LPF 6-10 0 - 5   Mucus PRESENT    Ca Oxalate Crys, UA PRESENT     Comment: Performed at [facility], [place] [address] [place],  [address]  Hepatitis panel, acute     Status: None (Preliminary result)   Collection Time:  05/09/22  9:25 AM  Result Value Ref Range   Hepatitis B Surface Ag  NON REACTIVE NON REACTIVE    Comment: Performed at [facility] [address] [place], [place] [address]   HCV Ab PENDING NON REACTIVE   Hep A IgM PENDING NON REACTIVE   Hep B C IgM PENDING NON REACTIVE   CT ABDOMEN PELVIS W CONTRAST  Result Date: 05/09/2022 CLINICAL DATA:  Nausea and vomiting.  Fever. EXAM: CT ABDOMEN AND PELVIS WITH CONTRAST TECHNIQUE: Multidetector CT imaging of the abdomen and pelvis was performed using the standard protocol following bolus administration of intravenous contrast. RADIATION DOSE REDUCTION: This exam was performed according to the departmental dose-optimization program which includes automated exposure control, adjustment of the mA and/or kV according to patient size and/or use of iterative reconstruction technique. CONTRAST:  152m OMNIPAQUE IOHEXOL 300 MG/ML  SOLN COMPARISON:  Abdominal ultrasound 05/09/2022 FINDINGS: Lower chest: Dependent subsegmental atelectasis in both lower lobes. Hepatobiliary: Suspected hepatic steatosis. Scattered hypodense renal lesions are present, these have greater than fluid density and some have marginal irregularity or nodularity such as the hypodense lesion on image 21 of series 3 which measures 2.4 by 1.5 cm, and the hypodense lesion on image 32 of series 3 which measures 3.9 by 2.5 cm. Accordingly these lesions are nonspecific, hepatic protocol MRI with and without contrast is once again recommended for definitive characterization. The possibility of hepatic abscesses is not excluded in this patient with fever although the seem to have less marginal enhancement than I would expect for abscesses. No biliary dilatation. Pancreas: Unremarkable Spleen: Unremarkable Adrenals/Urinary Tract: Unremarkable Stomach/Bowel: The appendix is poorly seen but appears to extend from the cecum on images 49 through 57 of series 7, this structure has a diameter of 1.0 cm reason the possibility of acute appendicitis. The distal margin of the  structures obscured by bowel loops. Vascular/Lymphatic: Unremarkable Reproductive: Unremarkable Other: No supplemental non-categorized findings. Musculoskeletal: Likely physiologic anterior wedging at T11. IMPRESSION: 1. Multiple complex hepatic lesions, measuring higher than fluid density, some with somewhat irregular or nodular margins. Hepatic abscesses are a possibility in this patient with fever. Other types of mass cannot be excluded. Metastatic disease to the liver is not completely excluded. Recommend hepatic protocol MRI with and without contrast. 2. A retrocecal tubular structure thought to probably represent appendix is thickened at 1.0 cm in diameter. The distal margin of this structure is obscured by adjacent loops of bowel. The appearance raises suspicion for potential acute appendicitis. Correlate with right lower quadrant tenderness or other characteristic physical exam findings. 3. Hepatic steatosis. Electronically Signed   By: WVan ClinesM.D.   On: 05/09/2022 11:57   [place] Limited RUQ (LIVER/GB)  Result Date: 05/09/2022 CLINICAL DATA:  Provided history: Pain. Jaundice. EXAM: ULTRASOUND ABDOMEN LIMITED RIGHT UPPER QUADRANT COMPARISON:  No pertinent prior exams available for comparison. FINDINGS: Gallbladder: No gallstones or wall thickening visualized. No sonographic Murphy sign noted by sonographer. Common bile duct: Diameter: 2-3 mm, within normal limits. Liver: There are multiple round to ovoid hypoechoic lesions within the right hepatic lobe, measuring 2.4 x 2.9 x 1.8 cm, 1.5 x 2.1 x 1.4 cm, 2.5 x 1.6 x 2.3 cm and 1.5 x 1.3 x 1.6 cm. There is background increased hepatic parenchymal echogenicity. Portal vein is patent on color Doppler imaging with normal direction of blood flow towards the liver. Impression #1 will be called to the ordering clinician or representative by the Radiologist Assistant, and communication documented in  the PACS or [employer]. IMPRESSION: 1.  Multiple round to ovoid hypoechoic lesions within the right hepatic lobe, measuring up to 2.4 x 2.9 x 1.8 cm. These lesions may reflect cysts and/or masses. A liver protocol abdominal MRI without and with contrast is recommended for further evaluation. 2. Background increased hepatic parenchymal echogenicity. This is a nonspecific finding, which may be seen in the setting of hepatic steatosis or other chronic hepatic parenchymal disease. 3. Otherwise unremarkable right upper quadrant ultrasound, as described. Electronically Signed   By: Kellie Simmering D.O.   On: 05/09/2022 10:33   DG Chest 2 View  Result Date: 05/09/2022 CLINICAL DATA:  A 51 year old male presents with fever. EXAM: CHEST - 2 VIEW COMPARISON:  None Available. FINDINGS: Trachea midline. Cardiomediastinal contours and hilar structures are normal. No sign of pleural effusion. On limited assessment no acute skeletal findings. There is a fracture of the distal third of the LEFT clavicle which may be subacute or chronic. IMPRESSION: 1. No acute cardiopulmonary disease. 2. Fracture of the distal third of the LEFT clavicle which may be subacute or chronic. Correlate with any pain in this area with patient history. Electronically Signed   By: Zetta Bills M.D.   On: 05/09/2022 09:23    Anti-infectives (From admission, onward)    Start     Dose/Rate Route Frequency Ordered Stop   05/09/22 1245  metroNIDAZOLE (FLAGYL) IVPB 500 mg        500 mg 100 mL/hr over 60 Minutes Intravenous Every 12 hours 05/09/22 1230     05/09/22 1245  cefTRIAXone (ROCEPHIN) 2 g in sodium chloride 0.9 % 100 mL IVPB        2 g 200 mL/hr over 30 Minutes Intravenous  Once 05/09/22 1230         Assessment/Plan Fever  Leukocytosis  Elevated LFT's  Liver Lesions  Possible Acute Appendicitis  Patient has been seen and examined. Vitals, labs, I/O, imaging and notes reviewed. Samuel Patterson is a 51 y.o. male who no significant past medical hx that was in normal state  of health until 6 days ago when he began having fevers to 103, HA, dry cough, fatigue, nausea, emesis x 1 and poor appetite. He was noted to have WBC 14.3, Alk phos 242, AST 101, ALT 308, T. Bili 3.5 and normal lipase. RUQ [place] w/ no cholelithiasis but multiple round to ovoid hypoechoic lesions within the right hepatic lobe, measuring up to 2.4 x 2.9 x 1.8 cm. CT A/P showed multiple complex hepatic lesions, measuring higher than fluid density, some with somewhat irregular or nodular margins concerning for hepatic abscesses vs other types of masses such as metastatic disease to the liver. There was also a concern for possible acute appendicitis. On exam the patient is completely NT with no tenderness with light or deep palpation of the abdomen. His story is not classic for acute appendicitis and I do not think he needs emergency surgery for this at this time. In regards to the liver lesions seen on CT - agree with GI consult, Hepatitis panel and suspect he will need MRI w/ contrast to further classify liver lesions. Agree with covering with abx for possible appendicitis and possible hepatic abscesses. Recommend [facility] admission. We will follow with you.   FEN - NPO, IVF per [facility] VTE -SCDs, okay for chemical prophylaxis from a general surgery standpoint ID - [id number]  - Per [facility] -  Hypokalemia  Hyperglycemia  ? L clavicle fx Legrand Como  Evette Cristal, PA-C [facility] 05/09/2022, 1:33 PM Please see Amion for pager number during day hours 7:00am-4:30pm

## 2022-05-10 ENCOUNTER — Other Ambulatory Visit (HOSPITAL_COMMUNITY): Payer: Self-pay | Admitting: *Deleted

## 2022-05-10 ENCOUNTER — Inpatient Hospital Stay (HOSPITAL_COMMUNITY): Payer: BC Managed Care – PPO

## 2022-05-10 DIAGNOSIS — A419 Sepsis, unspecified organism: Secondary | ICD-10-CM | POA: Diagnosis present

## 2022-05-10 DIAGNOSIS — Z20822 Contact with and (suspected) exposure to covid-19: Secondary | ICD-10-CM | POA: Diagnosis present

## 2022-05-10 DIAGNOSIS — K760 Fatty (change of) liver, not elsewhere classified: Secondary | ICD-10-CM | POA: Diagnosis present

## 2022-05-10 DIAGNOSIS — Z820 Family history of epilepsy and other diseases of the nervous system: Secondary | ICD-10-CM | POA: Diagnosis not present

## 2022-05-10 DIAGNOSIS — K769 Liver disease, unspecified: Secondary | ICD-10-CM

## 2022-05-10 DIAGNOSIS — Z833 Family history of diabetes mellitus: Secondary | ICD-10-CM | POA: Diagnosis not present

## 2022-05-10 DIAGNOSIS — Z789 Other specified health status: Secondary | ICD-10-CM | POA: Diagnosis not present

## 2022-05-10 DIAGNOSIS — K219 Gastro-esophageal reflux disease without esophagitis: Secondary | ICD-10-CM | POA: Diagnosis present

## 2022-05-10 DIAGNOSIS — J342 Deviated nasal septum: Secondary | ICD-10-CM | POA: Diagnosis present

## 2022-05-10 DIAGNOSIS — Z823 Family history of stroke: Secondary | ICD-10-CM | POA: Diagnosis not present

## 2022-05-10 DIAGNOSIS — E781 Pure hyperglyceridemia: Secondary | ICD-10-CM | POA: Diagnosis present

## 2022-05-10 DIAGNOSIS — Z79899 Other long term (current) drug therapy: Secondary | ICD-10-CM | POA: Diagnosis not present

## 2022-05-10 DIAGNOSIS — Z8249 Family history of ischemic heart disease and other diseases of the circulatory system: Secondary | ICD-10-CM | POA: Diagnosis not present

## 2022-05-10 DIAGNOSIS — Z803 Family history of malignant neoplasm of breast: Secondary | ICD-10-CM | POA: Diagnosis not present

## 2022-05-10 DIAGNOSIS — R509 Fever, unspecified: Secondary | ICD-10-CM | POA: Diagnosis present

## 2022-05-10 DIAGNOSIS — K750 Abscess of liver: Secondary | ICD-10-CM | POA: Diagnosis present

## 2022-05-10 DIAGNOSIS — I5030 Unspecified diastolic (congestive) heart failure: Secondary | ICD-10-CM | POA: Diagnosis not present

## 2022-05-10 DIAGNOSIS — R739 Hyperglycemia, unspecified: Secondary | ICD-10-CM | POA: Diagnosis present

## 2022-05-10 DIAGNOSIS — G47419 Narcolepsy without cataplexy: Secondary | ICD-10-CM | POA: Diagnosis present

## 2022-05-10 DIAGNOSIS — E876 Hypokalemia: Secondary | ICD-10-CM | POA: Diagnosis present

## 2022-05-10 HISTORY — PX: IR US GUIDE BX ASP/DRAIN: IMG2392

## 2022-05-10 LAB — COMPREHENSIVE METABOLIC PANEL
ALT: 191 U/L — ABNORMAL HIGH (ref 0–44)
AST: 44 U/L — ABNORMAL HIGH (ref 15–41)
Albumin: 2.7 g/dL — ABNORMAL LOW (ref 3.5–5.0)
Alkaline Phosphatase: 175 U/L — ABNORMAL HIGH (ref 38–126)
Anion gap: 9 (ref 5–15)
BUN: 8 mg/dL (ref 6–20)
CO2: 22 mmol/L (ref 22–32)
Calcium: 8 mg/dL — ABNORMAL LOW (ref 8.9–10.3)
Chloride: 102 mmol/L (ref 98–111)
Creatinine, Ser: 0.98 mg/dL (ref 0.61–1.24)
GFR, Estimated: >60 mL/min (ref >60)
Glucose, Bld: 131 mg/dL — ABNORMAL HIGH (ref 70–99)
Potassium: 3.2 mmol/L — ABNORMAL LOW (ref 3.5–5.1)
Sodium: 133 mmol/L — ABNORMAL LOW (ref 135–145)
Total Bilirubin: 1.8 mg/dL — ABNORMAL HIGH (ref 0.3–1.2)
Total Protein: 6.2 g/dL — ABNORMAL LOW (ref 6.5–8.1)

## 2022-05-10 LAB — CBC
HCT: 35.5 % — ABNORMAL LOW (ref 39.0–52.0)
Hemoglobin: 11.7 g/dL — ABNORMAL LOW (ref 13.0–17.0)
MCH: 29.9 pg (ref 26.0–34.0)
MCHC: 33 g/dL (ref 30.0–36.0)
MCV: 90.8 fL (ref 80.0–100.0)
Platelets: 110 10*3/uL — ABNORMAL LOW (ref 150–400)
RBC: 3.91 MIL/uL — ABNORMAL LOW (ref 4.22–5.81)
RDW: 13.2 % (ref 11.5–15.5)
WBC: 9.1 10*3/uL (ref 4.0–10.5)
nRBC: 0 % (ref 0.0–0.2)

## 2022-05-10 LAB — PROCALCITONIN: Procalcitonin: 4.68 ng/mL

## 2022-05-10 LAB — MRSA NEXT GEN BY PCR, NASAL: MRSA by PCR Next Gen: NOT DETECTED

## 2022-05-10 LAB — ACETAMINOPHEN LEVEL: Acetaminophen (Tylenol), Serum: <10 ug/mL — ABNORMAL LOW (ref 10–30)

## 2022-05-10 MED ORDER — LIDOCAINE HCL 1 % IJ SOLN
INTRAMUSCULAR | Status: AC
  Filled 2022-05-10: qty 20

## 2022-05-10 MED ORDER — MIDAZOLAM HCL 2 MG/2ML IJ SOLN
INTRAMUSCULAR | Status: AC
  Filled 2022-05-10: qty 4

## 2022-05-10 MED ORDER — MIDAZOLAM HCL 2 MG/2ML IJ SOLN
INTRAMUSCULAR | Status: DC | PRN
  Administered 2022-05-10 (×2): 1 mg via INTRAVENOUS

## 2022-05-10 MED ORDER — POTASSIUM CHLORIDE CRYS ER 20 MEQ PO TBCR
40.0000 meq | EXTENDED_RELEASE_TABLET | Freq: Once | ORAL | Status: AC
  Administered 2022-05-10: 40 meq via ORAL
  Filled 2022-05-10: qty 2

## 2022-05-10 MED ORDER — FENTANYL CITRATE (PF) 100 MCG/2ML IJ SOLN
INTRAMUSCULAR | Status: DC | PRN
  Administered 2022-05-10 (×2): 50 ug via INTRAVENOUS

## 2022-05-10 MED ORDER — FENTANYL CITRATE (PF) 100 MCG/2ML IJ SOLN
INTRAMUSCULAR | Status: AC
  Filled 2022-05-10: qty 4

## 2022-05-10 NOTE — Evaluation (Signed)
Physical Therapy Evaluation & Discharge  Patient Details Name: QUINDON DENKER MRN: [id number] DOB: 05-Jun-1971 Today's Date: 05/10/2022  History of Present Illness  51 y/o male presented to ED on 05/09/22 for fever x 5 days and vomiting. CT abdomen showed multiple complex hepatic lesions. MRI confirmed hepatic abscesses. No significant PMH.  Clinical Impression  Patient admitted with the above. PTA, patient lives with wife and was independent. Patient currently functioning at independent level with no AD. Encouraged continued mobility in hallway 2-3x/day or more to maintain strength and activity tolerance. No further skilled PT needs identified acutely. No PT follow up recommended at this time. PT will sign off.        Recommendations for follow up therapy are one component of a multi-disciplinary discharge planning process, led by the attending physician.  Recommendations may be updated based on patient status, additional functional criteria and insurance authorization.  Follow Up Recommendations No PT follow up    Assistance Recommended at Discharge None  Patient can return home with the following       Equipment Recommendations None recommended by PT  Recommendations for Other Services       Functional Status Assessment Patient has not had a recent decline in their functional status     Precautions / Restrictions Precautions Precautions: None Restrictions Weight Bearing Restrictions: No      Mobility  Bed Mobility Overal bed mobility: Independent                  Transfers Overall transfer level: Independent Equipment used: None                    Ambulation/Gait Ambulation/Gait assistance: Independent Gait Distance (Feet): 500 Feet Assistive device: None Gait Pattern/deviations: WFL(Within Functional Limits)   Gait velocity interpretation: >4.37 ft/sec, indicative of normal walking speed      Stairs            Wheelchair Mobility     Modified Rankin (Stroke Patients Only)       Balance Overall balance assessment: Independent                                           Pertinent Vitals/Pain Pain Assessment Pain Assessment: No/denies pain    Home Living Family/patient expects to be discharged to:: Private residence Living Arrangements: Spouse/significant other Available Help at Discharge: Family Type of Home: House Home Access: Stairs to enter   Secretary/administrator of Steps: 1   Home Layout: One level Home Equipment: None      Prior Function Prior Level of Function : Independent/Modified Independent;Working/employed;Driving             Mobility Comments: 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       [employer]        Extremity/Trunk Assessment   Upper Extremity Assessment Upper Extremity Assessment: Overall WFL for tasks assessed    Lower Extremity Assessment Lower Extremity Assessment: Overall WFL for tasks assessed    Cervical / Trunk Assessment Cervical / Trunk Assessment: Normal  Communication   Communication: No difficulties  Cognition Arousal/Alertness: Awake/alert Behavior During Therapy: WFL for tasks assessed/performed Overall Cognitive Status: Within Functional Limits for tasks assessed  General Comments      Exercises     Assessment/Plan    PT Assessment Patient does not need any further PT services  PT Problem List         PT Treatment Interventions      PT Goals (Current goals can be found in the Care Plan section)  Acute Rehab PT Goals Patient Stated Goal: to figure out what's going on and go home PT Goal Formulation: All assessment and education complete, DC therapy    Frequency       Co-evaluation               AM-PAC PT "6 Clicks" Mobility  Outcome Measure Help needed turning from your back to your side while in a flat bed without using  bedrails?: None Help needed moving from lying on your back to sitting on the side of a flat bed without using bedrails?: None Help needed moving to and from a bed to a chair (including a wheelchair)?: None Help needed standing up from a chair using your arms (e.g., wheelchair or bedside chair)?: None Help needed to walk in hospital room?: None Help needed climbing 3-5 steps with a railing? : None 6 Click Score: 24    End of Session   Activity Tolerance: Patient tolerated treatment well Patient left: in bed;with call bell/phone within reach;with nursing/sitter in room;with family/visitor present Nurse Communication: Mobility status PT Visit Diagnosis: Muscle weakness (generalized) (M62.81)    Time: 1057-1110 PT Time Calculation (min) (ACUTE ONLY): 13 min   Charges:   PT Evaluation $PT Eval Low Complexity: 1 Low          Clarann Helvey A. Dan Humphreys PT, DPT Acute Rehabilitation Services Office [phone]   Viviann Spare 05/10/2022, 12:25 PM

## 2022-05-10 NOTE — Plan of Care (Signed)
  Problem: Respiratory: Goal: Ability to maintain adequate ventilation will improve Outcome: Progressing   Problem: Education: Goal: Knowledge of General Education information will improve Description: Including pain rating scale, medication(s)/side effects and non-pharmacologic comfort measures Outcome: Progressing   Problem: Activity: Goal: Risk for activity intolerance will decrease Outcome: Progressing   Problem: Nutrition: Goal: Adequate nutrition will be maintained Outcome: Progressing   Problem: Elimination: Goal: Will not experience complications related to bowel motility Outcome: Progressing Goal: Will not experience complications related to urinary retention Outcome: Progressing   Problem: Safety: Goal: Ability to remain free from injury will improve Outcome: Progressing

## 2022-05-10 NOTE — Consult Note (Signed)
Chief Complaint: Patient was seen in consultation today for hepatic abscess  Referring Physician(s): Susa Raring, MD  Supervising Physician: Marliss Coots  Patient Status: [facility] - In-pt  History of Present Illness: Samuel Patterson is a 51 y.o. male with a past medical history significant for narcolepsy, GERD, diverticulosis who presented to [facility] ED on 05/09/22 with complaints of persistent fevers, n/v and general malaise. In the ED initial assessment showed patient to be afebrile (99.7), tachycardic, essentially normotensive with leukocytosis (14.3) and elevated liver enzymes. RUQ [place] was performed which showed:  1. Multiple round to ovoid hypoechoic lesions within the right hepatic lobe, measuring up to 2.4 x 2.9 x 1.8 cm. These lesions may reflect cysts and/or masses. A liver protocol abdominal MRI without and with contrast is recommended for further evaluation. 2. Background increased hepatic parenchymal echogenicity. This is a nonspecific finding, which may be seen in the setting of hepatic steatosis or other chronic hepatic parenchymal disease. 3. Otherwise unremarkable right upper quadrant ultrasound, as described.  He also underwent CT abd/pelvis w/contrast which showed:  1. Multiple complex hepatic lesions, measuring higher than fluid density, some with somewhat irregular or nodular margins. Hepatic abscesses are a possibility in this patient with fever. Other types of mass cannot be excluded. Metastatic disease to the liver is not completely excluded. Recommend hepatic protocol MRI with and without contrast. 2. A retrocecal tubular structure thought to probably represent appendix is thickened at 1.0 cm in diameter. The distal margin of this structure is obscured by adjacent loops of bowel. The appearance raises suspicion for potential acute appendicitis. Correlate with right lower quadrant tenderness or other characteristic physical exam findings. 3. Hepatic  steatosis.  GI was consulted and MRI was obtained for further evaluation of complex hepatic lesions which showed:  1. Multiple liver lesions scattered throughout the hepatic parenchyma with imaging characteristics most indicative of hepatic abscesses. Several of these have some large perfusion anomalies associated with them. Repeat abdominal MRI is recommended in 3 months to ensure regression of these findings following therapy.  IR has been consulted for possible percutaneous aspiration to further direct care.  Patient seen in his room, he states his wife just left but understands he may have a procedure today. He denies any pain or nausea right now, he is just tired and overwhelmed from the events of yesterday. We discussed percutaneous aspiration and he is agreeable to proceed.   Past Medical History:  Diagnosis Date   Allergy    vasomotor rhinitis   GERD (gastroesophageal reflux disease)    Narcolepsy     Past Surgical History:  Procedure Laterality Date   VASECTOMY      Allergies: Patient has no known allergies.  Medications: Prior to Admission medications   Medication Sig Start Date End Date Taking? Authorizing Provider  Calcium Carbonate Antacid (TUMS PO) Take 1 tablet by mouth 2 (two) times daily as needed (acid reflux).   Yes [provider]  fluticasone (FLONASE SENSIMIST) 27.5 MCG/SPRAY nasal spray Place 2 sprays into the nose daily. 01/30/22  Yes RuddBertram Millard, MD  Glucosamine-Chondroit-Vit C-Mn (GLUCOSAMINE CHONDR 500 COMPLEX PO) Take 500 mg by mouth daily.   Yes [provider]  ibuprofen (ADVIL) 200 MG tablet Take 400 mg by mouth See admin instructions. Take 400 mg by mouth 1-3 times a day as needed for pain   Yes [provider]  ibuprofen (ADVIL) 800 MG tablet Take 1 tablet (800 mg total) by mouth every 8 (eight) hours as  needed. Patient taking differently: Take 800 mg by mouth daily as needed for fever. 05/06/22  Yes Garnette Gunnerhompson, Aaron B,  MD  METAMUCIL FIBER PO Take 1 Scoop by mouth daily.   Yes [provider]  methylphenidate (RITALIN) 10 MG tablet Take 1 tablet (10 mg total) by mouth 2 (two) times daily. Patient taking differently: Take 10 mg by mouth daily. 04/02/22  Yes Loyola Mastudd, Stephen M, MD  Multiple Vitamin (MULTIVITAMIN) tablet Take 2 tablets by mouth daily.   Yes [provider]  Omega-3 Fatty Acids (FISH OIL) 1000 MG CAPS Take 1,000 mg by mouth daily.   Yes [provider]  omeprazole (PRILOSEC OTC) 20 MG tablet Take 20 mg by mouth every other day.   Yes [provider]  Phenylephrine HCl (SINEX REGULAR NA) Place 1 spray into the nose in the morning and at bedtime.   Yes [provider]     Family History  Problem Relation Age of Onset   Cancer Mother        Breast   Dementia Mother    Heart disease Father    Colon polyps Father    Heart disease Brother 1640       MI   Parkinson's disease Maternal Grandfather    Diabetes Paternal Grandmother    Stroke Paternal Grandmother    Heart disease Paternal Grandfather    Colon cancer Neg Hx    Esophageal cancer Neg Hx    Rectal cancer Neg Hx    Stomach cancer Neg Hx     Social History   Socioeconomic History   Marital status: Married    Spouse name: Not on file   Number of children: Not on file   Years of education: Not on file   Highest education level: Not on file  Occupational History   Occupation: Technology/phones    [employer]er[employer]   Occupation: Visual merchandiserarmer  Tobacco Use   Smoking status: Never   Smokeless tobacco: Never  Vaping Use   Vaping Use: Never used  Substance and Sexual Activity   Alcohol use: Yes    Alcohol/week: 14.0 standard drinks of alcohol    Types: 14 Cans of beer per week    Comment: 2 beers daily   Drug use: No   Sexual activity: Yes  Other Topics Concern   Not on file  Social History Narrative   Not on file   Social Determinants of Health   Financial Resource  Strain: Not on file  Food Insecurity: Not on file  Transportation Needs: Not on file  Physical Activity: Not on file  Stress: Not on file  Social Connections: Not on file     Review of Systems: A 12 point ROS discussed and pertinent positives are indicated in the HPI above.  All other systems are negative.  Review of Systems  Constitutional:  Positive for fatigue. Negative for chills and fever.  Respiratory:  Negative for cough and shortness of breath.   Cardiovascular:  Negative for chest pain.  Gastrointestinal:  Negative for abdominal pain, nausea and vomiting.  Neurological:  Negative for dizziness and headaches.    Vital Signs: BP 118/81 (BP Location: Right Arm)   Pulse 85   Temp 98.2 F (36.8 C) (Oral)   Resp 18   SpO2 98%   Physical Exam Vitals and nursing note reviewed.  Constitutional:      General: He is not in acute distress.    Appearance: He is not ill-appearing.  HENT:  Head: Normocephalic.     Mouth/Throat:     Mouth: Mucous membranes are moist.     Pharynx: Oropharynx is clear. No oropharyngeal exudate or posterior oropharyngeal erythema.  Cardiovascular:     Rate and Rhythm: Normal rate and regular rhythm.  Pulmonary:     Effort: Pulmonary effort is normal.     Breath sounds: Normal breath sounds.  Abdominal:     General: There is no distension.     Palpations: Abdomen is soft.     Tenderness: There is no abdominal tenderness.  Skin:    General: Skin is warm and dry.     Coloration: Skin is not jaundiced.  Neurological:     Mental Status: He is alert and oriented to person, place, and time.  Psychiatric:        Mood and Affect: Mood normal.        Behavior: Behavior normal.        Thought Content: Thought content normal.        Judgment: Judgment normal.      MD Evaluation Airway: WNL Heart: WNL Abdomen: WNL Chest/ Lungs: WNL ASA  Classification: 2 Mallampati/Airway Score: One   Imaging: MR ABDOMEN W WO CONTRAST  Result Date:  05/10/2022 CLINICAL DATA:  50 year old male with history of fever, nausea and vomiting. Multiple indeterminate liver lesions noted on CT examination. Follow-up study. EXAM: MRI ABDOMEN WITHOUT AND WITH CONTRAST TECHNIQUE: Multiplanar multisequence MR imaging of the abdomen was performed both before and after the administration of intravenous contrast. CONTRAST:  5mL GADAVIST GADOBUTROL 1 MMOL/ML IV SOLN COMPARISON:  No prior abdominal MRI. CT the abdomen and pelvis 05/09/2022. FINDINGS: Lower chest: Unremarkable. Hepatobiliary: Scattered throughout the liver there are multiple T1 hypointense, T2 hyperintense lesions which are centrally hypovascular but demonstrate peripheral rim enhancement and several demonstrate surrounding perfusion anomalies. These lesions all restrict diffusion. The largest of these lesions is in segment 6 (axial image 32 of series 4 and coronal image 10 of series 3) measuring 3.9 x 2.5 x 3.0 cm. No intrahepatic biliary ductal dilatation. Gallbladder is unremarkable in appearance. Pancreas: No pancreatic mass. No pancreatic ductal dilatation. No pancreatic or peripancreatic fluid collections or inflammatory changes. Spleen:  Unremarkable. Adrenals/Urinary Tract: Bilateral kidneys and bilateral adrenal glands are normal in appearance. No hydroureteronephrosis in the visualized portions of the abdomen. Stomach/Bowel: Visualized portions are unremarkable. Vascular/Lymphatic: No aneurysm identified in the visualized abdominal vasculature. No lymphadenopathy noted in the abdomen. Other: No significant volume of ascites noted in the visualized portions of the peritoneal cavity. Musculoskeletal: No aggressive appearing osseous lesions are noted in the visualized portions of the skeleton. IMPRESSION: 1. Multiple liver lesions scattered throughout the hepatic parenchyma with imaging characteristics most indicative of hepatic abscesses. Several of these have some large perfusion anomalies associated  with them. Repeat abdominal MRI is recommended in 3 months to ensure regression of these findings following therapy. Electronically Signed   By: Trudie Reed M.D.   On: 05/10/2022 06:52   CT ABDOMEN PELVIS W CONTRAST  Result Date: 05/09/2022 CLINICAL DATA:  Nausea and vomiting.  Fever. EXAM: CT ABDOMEN AND PELVIS WITH CONTRAST TECHNIQUE: Multidetector CT imaging of the abdomen and pelvis was performed using the standard protocol following bolus administration of intravenous contrast. RADIATION DOSE REDUCTION: This exam was performed according to the departmental dose-optimization program which includes automated exposure control, adjustment of the mA and/or kV according to patient size and/or use of iterative reconstruction technique. CONTRAST:  OMNIPAQUE IOHEXOL 300 MG/ML  SOLN  COMPARISON:  Abdominal ultrasound 05/09/2022 FINDINGS: Lower chest: Dependent subsegmental atelectasis in both lower lobes. Hepatobiliary: Suspected hepatic steatosis. Scattered hypodense renal lesions are present, these have greater than fluid density and some have marginal irregularity or nodularity such as the hypodense lesion on image 21 of series 3 which measures 2.4 by 1.5 cm, and the hypodense lesion on image 32 of series 3 which measures 3.9 by 2.5 cm. Accordingly these lesions are nonspecific, hepatic protocol MRI with and without contrast is once again recommended for definitive characterization. The possibility of hepatic abscesses is not excluded in this patient with fever although the seem to have less marginal enhancement than I would expect for abscesses. No biliary dilatation. Pancreas: Unremarkable Spleen: Unremarkable Adrenals/Urinary Tract: Unremarkable Stomach/Bowel: The appendix is poorly seen but appears to extend from the cecum on images 49 through 57 of series 7, this structure has a diameter of 1.0 cm reason the possibility of acute appendicitis. The distal margin of the structures obscured by bowel  loops. Vascular/Lymphatic: Unremarkable Reproductive: Unremarkable Other: No supplemental non-categorized findings. Musculoskeletal: Likely physiologic anterior wedging at T11. IMPRESSION: 1. Multiple complex hepatic lesions, measuring higher than fluid density, some with somewhat irregular or nodular margins. Hepatic abscesses are a possibility in this patient with fever. Other types of mass cannot be excluded. Metastatic disease to the liver is not completely excluded. Recommend hepatic protocol MRI with and without contrast. 2. A retrocecal tubular structure thought to probably represent appendix is thickened at 1.0 cm in diameter. The distal margin of this structure is obscured by adjacent loops of bowel. The appearance raises suspicion for potential acute appendicitis. Correlate with right lower quadrant tenderness or other characteristic physical exam findings. 3. Hepatic steatosis. Electronically Signed   By: Gaylyn Rong M.D.   On: 05/09/2022 11:57   US Abdomen Limited RUQ (LIVER/GB)  Result Date: 05/09/2022 CLINICAL DATA:  Provided history: Pain. Jaundice. EXAM: ULTRASOUND ABDOMEN LIMITED RIGHT UPPER QUADRANT COMPARISON:  No pertinent prior exams available for comparison. FINDINGS: Gallbladder: No gallstones or wall thickening visualized. No sonographic Murphy sign noted by sonographer. Common bile duct: Diameter: 2-3 mm, within normal limits. Liver: There are multiple round to ovoid hypoechoic lesions within the right hepatic lobe, measuring 2.4 x 2.9 x 1.8 cm, 1.5 x 2.1 x 1.4 cm, 2.5 x 1.6 x 2.3 cm and 1.5 x 1.3 x 1.6 cm. There is background increased hepatic parenchymal echogenicity. Portal vein is patent on color Doppler imaging with normal direction of blood flow towards the liver. Impression #1 will be called to the ordering clinician or representative by the Radiologist Assistant, and communication documented in the PACS or [employer]. IMPRESSION: 1. Multiple round to ovoid hypoechoic  lesions within the right hepatic lobe, measuring up to 2.4 x 2.9 x 1.8 cm. These lesions may reflect cysts and/or masses. A liver protocol abdominal MRI without and with contrast is recommended for further evaluation. 2. Background increased hepatic parenchymal echogenicity. This is a nonspecific finding, which may be seen in the setting of hepatic steatosis or other chronic hepatic parenchymal disease. 3. Otherwise unremarkable right upper quadrant ultrasound, as described. Electronically Signed   By: Jackey Loge D.O.   On: 05/09/2022 10:33   DG Chest 2 View  Result Date: 05/09/2022 CLINICAL DATA:  A 52 year old male presents with fever. EXAM: CHEST - 2 VIEW COMPARISON:  None Available. FINDINGS: Trachea midline. Cardiomediastinal contours and hilar structures are normal. No sign of pleural effusion. On limited assessment no acute skeletal findings. There is  a fracture of the distal third of the LEFT clavicle which may be subacute or chronic. IMPRESSION: 1. No acute cardiopulmonary disease. 2. Fracture of the distal third of the LEFT clavicle which may be subacute or chronic. Correlate with any pain in this area with patient history. Electronically Signed   By: Donzetta Kohut M.D.   On: 05/09/2022 09:23    Labs:  CBC: Recent Labs    05/09/22 0556 05/10/22 0033  WBC 14.3* 9.1  HGB 13.3 11.7*  HCT 38.9* 35.5*  PLT 159 110*    COAGS: Recent Labs    05/09/22 1636  INR 1.1  APTT 30    BMP: Recent Labs    05/09/22 0556 05/10/22 0033  NA 135 133*  K 3.2* 3.2*  CL 99 102  CO2 22 22  GLUCOSE 171* 131*  BUN 10 8  CALCIUM 8.4* 8.0*  CREATININE 1.04 0.98  GFRNONAA >60 >60    LIVER FUNCTION TESTS: Recent Labs    05/09/22 0556 05/10/22 0033  BILITOT 3.5* 1.8*  AST 101* 44*  ALT 308* 191*  ALKPHOS 242* 175*  PROT 7.0 6.2*  ALBUMIN 2.9* 2.7*    TUMOR MARKERS: No results for input(s): "AFPTM", "CEA", "CA199", "CHROMGRNA" in the last 8760 hours.  Assessment and Plan:  51  y/o M who presented to [facility] ED yesterday with complaints of persistent fever, malaise, n/v found to have multiple liver lesions with imaging characteristics most consistent with abscesses. IR has been consulted for percutaneous aspiration - patient history and imaging reviewed by Dr. Elby Showers who approves aspiration of the largest hepatic lesion to further direct care.  Patient has been NPO today except for sips with medications, he is agreeable to proceed.  Risks and benefits discussed with the patient including bleeding, infection, damage to adjacent structures, bowel perforation, and sepsis.  All of the patient's questions were answered, patient is agreeable to proceed.  Consent signed and in chart.  Thank you for this interesting consult.  I greatly enjoyed meeting RICE WALSH and look forward to participating in their care.  A copy of this report was sent to the requesting provider on this date.  Electronically Signed: Villa Herb, PA-C 05/10/2022, 11:24 AM   I spent a total of 40 Minutes in face to face in clinical consultation, greater than 50% of which was counseling/coordinating care for liver abscess aspiration.

## 2022-05-10 NOTE — Progress Notes (Signed)
Subjective: CC: Feels better. Chills, n/v resolved. No abdominal pain. Still febrile overnight.   Objective: Vital signs in last 24 hours: Temp:  [98.7 F (37.1 C)-101.8 F (38.8 C)] 98.7 F (37.1 C) (06/17 0623) Pulse Rate:  [79-124] 88 (06/17 0623) Resp:  [17-20] 17 (06/17 0623) BP: (114-159)/(60-86) 120/76 (06/17 0623) SpO2:  [93 %-100 %] 97 % (06/17 0623) Last BM Date : 05/09/22  Intake/Output from previous day: 06/16 0701 - 06/17 0700 In: 2811.6 [P.O.:480; IV Piggyback:2331.6] Out: -  Intake/Output this shift: No intake/output data recorded.  PE: Gen:  Alert, NAD, pleasant Abd: Soft, ND, NT, +BS Psych: A&Ox3   Lab Results:  Recent Labs    05/09/22 0556 05/10/22 0033  WBC 14.3* 9.1  HGB 13.3 11.7*  HCT 38.9* 35.5*  PLT 159 110*   BMET Recent Labs    05/09/22 0556 05/10/22 0033  NA 135 133*  K 3.2* 3.2*  CL 99 102  CO2 22 22  GLUCOSE 171* 131*  BUN 10 8  CREATININE 1.04 0.98  CALCIUM 8.4* 8.0*   PT/INR Recent Labs    05/09/22 1636  LABPROT 13.9  INR 1.1   CMP     Component Value Date/Time   NA 133 (L) 05/10/2022 0033   K 3.2 (L) 05/10/2022 0033   CL 102 05/10/2022 0033   CO2 22 05/10/2022 0033   GLUCOSE 131 (H) 05/10/2022 0033   BUN 8 05/10/2022 0033   CREATININE 0.98 05/10/2022 0033   CALCIUM 8.0 (L) 05/10/2022 0033   PROT 6.2 (L) 05/10/2022 0033   ALBUMIN 2.7 (L) 05/10/2022 0033   AST 44 (H) 05/10/2022 0033   ALT 191 (H) 05/10/2022 0033   ALKPHOS 175 (H) 05/10/2022 0033   BILITOT 1.8 (H) 05/10/2022 0033   GFRNONAA >60 05/10/2022 0033   Lipase     Component Value Date/Time   LIPASE 23 05/09/2022 0556    Studies/Results: MR ABDOMEN W WO CONTRAST  Result Date: 05/10/2022 CLINICAL DATA:  51 year old male with history of fever, nausea and vomiting. Multiple indeterminate liver lesions noted on CT examination. Follow-up study. EXAM: MRI ABDOMEN WITHOUT AND WITH CONTRAST TECHNIQUE: Multiplanar multisequence MR imaging of  the abdomen was performed both before and after the administration of intravenous contrast. CONTRAST:  61mL GADAVIST GADOBUTROL 1 MMOL/ML IV SOLN COMPARISON:  No prior abdominal MRI. CT the abdomen and pelvis 05/09/2022. FINDINGS: Lower chest: Unremarkable. Hepatobiliary: Scattered throughout the liver there are multiple T1 hypointense, T2 hyperintense lesions which are centrally hypovascular but demonstrate peripheral rim enhancement and several demonstrate surrounding perfusion anomalies. These lesions all restrict diffusion. The largest of these lesions is in segment 6 (axial image 32 of series 4 and coronal image 10 of series 3) measuring 3.9 x 2.5 x 3.0 cm. No intrahepatic biliary ductal dilatation. Gallbladder is unremarkable in appearance. Pancreas: No pancreatic mass. No pancreatic ductal dilatation. No pancreatic or peripancreatic fluid collections or inflammatory changes. Spleen:  Unremarkable. Adrenals/Urinary Tract: Bilateral kidneys and bilateral adrenal glands are normal in appearance. No hydroureteronephrosis in the visualized portions of the abdomen. Stomach/Bowel: Visualized portions are unremarkable. Vascular/Lymphatic: No aneurysm identified in the visualized abdominal vasculature. No lymphadenopathy noted in the abdomen. Other: No significant volume of ascites noted in the visualized portions of the peritoneal cavity. Musculoskeletal: No aggressive appearing osseous lesions are noted in the visualized portions of the skeleton. IMPRESSION: 1. Multiple liver lesions scattered throughout the hepatic parenchyma with imaging characteristics most indicative of hepatic abscesses. Several of these  have some large perfusion anomalies associated with them. Repeat abdominal MRI is recommended in 3 months to ensure regression of these findings following therapy. Electronically Signed   By: Trudie Reed M.D.   On: 05/10/2022 06:52   CT ABDOMEN PELVIS W CONTRAST  Result Date: 05/09/2022 CLINICAL DATA:   Nausea and vomiting.  Fever. EXAM: CT ABDOMEN AND PELVIS WITH CONTRAST TECHNIQUE: Multidetector CT imaging of the abdomen and pelvis was performed using the standard protocol following bolus administration of intravenous contrast. RADIATION DOSE REDUCTION: This exam was performed according to the departmental dose-optimization program which includes automated exposure control, adjustment of the mA and/or kV according to patient size and/or use of iterative reconstruction technique. CONTRAST:  OMNIPAQUE IOHEXOL 300 MG/ML  SOLN COMPARISON:  Abdominal ultrasound 05/09/2022 FINDINGS: Lower chest: Dependent subsegmental atelectasis in both lower lobes. Hepatobiliary: Suspected hepatic steatosis. Scattered hypodense renal lesions are present, these have greater than fluid density and some have marginal irregularity or nodularity such as the hypodense lesion on image 21 of series 3 which measures 2.4 by 1.5 cm, and the hypodense lesion on image 32 of series 3 which measures 3.9 by 2.5 cm. Accordingly these lesions are nonspecific, hepatic protocol MRI with and without contrast is once again recommended for definitive characterization. The possibility of hepatic abscesses is not excluded in this patient with fever although the seem to have less marginal enhancement than I would expect for abscesses. No biliary dilatation. Pancreas: Unremarkable Spleen: Unremarkable Adrenals/Urinary Tract: Unremarkable Stomach/Bowel: The appendix is poorly seen but appears to extend from the cecum on images 49 through 57 of series 7, this structure has a diameter of 1.0 cm reason the possibility of acute appendicitis. The distal margin of the structures obscured by bowel loops. Vascular/Lymphatic: Unremarkable Reproductive: Unremarkable Other: No supplemental non-categorized findings. Musculoskeletal: Likely physiologic anterior wedging at T11. IMPRESSION: 1. Multiple complex hepatic lesions, measuring higher than fluid density, some  with somewhat irregular or nodular margins. Hepatic abscesses are a possibility in this patient with fever. Other types of mass cannot be excluded. Metastatic disease to the liver is not completely excluded. Recommend hepatic protocol MRI with and without contrast. 2. A retrocecal tubular structure thought to probably represent appendix is thickened at 1.0 cm in diameter. The distal margin of this structure is obscured by adjacent loops of bowel. The appearance raises suspicion for potential acute appendicitis. Correlate with right lower quadrant tenderness or other characteristic physical exam findings. 3. Hepatic steatosis. Electronically Signed   By: Gaylyn Rong M.D.   On: 05/09/2022 11:57   US Abdomen Limited RUQ (LIVER/GB)  Result Date: 05/09/2022 CLINICAL DATA:  Provided history: Pain. Jaundice. EXAM: ULTRASOUND ABDOMEN LIMITED RIGHT UPPER QUADRANT COMPARISON:  No pertinent prior exams available for comparison. FINDINGS: Gallbladder: No gallstones or wall thickening visualized. No sonographic Murphy sign noted by sonographer. Common bile duct: Diameter: 2-3 mm, within normal limits. Liver: There are multiple round to ovoid hypoechoic lesions within the right hepatic lobe, measuring 2.4 x 2.9 x 1.8 cm, 1.5 x 2.1 x 1.4 cm, 2.5 x 1.6 x 2.3 cm and 1.5 x 1.3 x 1.6 cm. There is background increased hepatic parenchymal echogenicity. Portal vein is patent on color Doppler imaging with normal direction of blood flow towards the liver. Impression #1 will be called to the ordering clinician or representative by the Radiologist Assistant, and communication documented in the PACS or [employer]. IMPRESSION: 1. Multiple round to ovoid hypoechoic lesions within the right hepatic lobe, measuring up to 2.4  x 2.9 x 1.8 cm. These lesions may reflect cysts and/or masses. A liver protocol abdominal MRI without and with contrast is recommended for further evaluation. 2. Background increased hepatic parenchymal  echogenicity. This is a nonspecific finding, which may be seen in the setting of hepatic steatosis or other chronic hepatic parenchymal disease. 3. Otherwise unremarkable right upper quadrant ultrasound, as described. Electronically Signed   By: Jackey Loge D.O.   On: 05/09/2022 10:33   DG Chest 2 View  Result Date: 05/09/2022 CLINICAL DATA:  A 51 year old male presents with fever. EXAM: CHEST - 2 VIEW COMPARISON:  None Available. FINDINGS: Trachea midline. Cardiomediastinal contours and hilar structures are normal. No sign of pleural effusion. On limited assessment no acute skeletal findings. There is a fracture of the distal third of the LEFT clavicle which may be subacute or chronic. IMPRESSION: 1. No acute cardiopulmonary disease. 2. Fracture of the distal third of the LEFT clavicle which may be subacute or chronic. Correlate with any pain in this area with patient history. Electronically Signed   By: Donzetta Kohut M.D.   On: 05/09/2022 09:23    Anti-infectives: Anti-infectives (From admission, onward)    Start     Dose/Rate Route Frequency Ordered Stop   05/10/22 1400  cefTRIAXone (ROCEPHIN) 2 g in sodium chloride 0.9 % 100 mL IVPB        2 g 200 mL/hr over 30 Minutes Intravenous Every 24 hours 05/09/22 1511     05/09/22 1245  metroNIDAZOLE (FLAGYL) IVPB 500 mg        500 mg 100 mL/hr over 60 Minutes Intravenous Every 12 hours 05/09/22 1230     05/09/22 1245  cefTRIAXone (ROCEPHIN) 2 g in sodium chloride 0.9 % 100 mL IVPB        2 g 200 mL/hr over 30 Minutes Intravenous  Once 05/09/22 1230 05/09/22 1550        Assessment/Plan Hepatic Lesions concerning for Hepatic Abscesses  Fever  Leukocytosis  Elevated LFT's  Possible Acute Appendicitis  - MRCP with multiple liver lesions most indicative of hepatic abscesses. No travel outside of the country or recent travel. No IVDU. Hep and HIV panel neg. Unclear etiology. GI following. Continue abx. Do not suspect we would want to drain  these but will review with MD.  - CT A/P had concerns for appendicitis. His hx and exam are not c/w this. He is completely NT on exam and has had no abdominal pain. No indication for emergency surgery. He is already on abx to cover suspected hepatic abscesses which should also cover for this  - We will follow with you   FEN - NPO until review scans with MD - may be able to have CLD today, IVF per [facility] VTE -SCDs, okay for chemical prophylaxis from a general surgery standpoint ID - Zosyn   - Per [facility] -  Hypokalemia  Hyperglycemia  ? L clavicle fx    LOS: 0 days    Jacinto Halim , [facility] 05/10/2022, 8:37 AM Please see Amion for pager number during day hours 7:00am-4:30pm

## 2022-05-10 NOTE — Progress Notes (Signed)
   05/10/22 0048  Assess: MEWS Score  Temp (!) 101 F (38.3 C)  BP (!) 159/82  MAP (mmHg) 103  Pulse Rate (!) 124  Resp 18  Level of Consciousness Alert  SpO2 100 %  O2 Device Room Air  Assess: MEWS Score  MEWS Temp 1  MEWS Systolic 0  MEWS Pulse 2  MEWS RR 0  MEWS LOC 0  MEWS Score 3  MEWS Score Color Yellow  Assess: if the MEWS score is Yellow or Red  Were vital signs taken at a resting state? Yes  Focused Assessment No change from prior assessment  Does the patient meet 2 or more of the SIRS criteria? Yes  MEWS guidelines implemented *See Row Information* No, previously yellow, continue vital signs every 4 hours  Treat  MEWS Interventions Escalated (See documentation below)  Pain Scale 0-10  Pain Score 4  Pain Type Acute pain  Pain Location Head  Pain Orientation Left;Right  Pain Descriptors / Indicators Aching  Pain Frequency Intermittent  Pain Intervention(s) Medication (See eMAR)  Escalate  MEWS: Escalate Yellow: discuss with charge nurse/RN and consider discussing with provider and RRT  Notify: Charge Nurse/RN  Name of Charge Nurse/RN Notified  Donell Beers)  Date Charge Nurse/RN Notified 05/10/22  Time Charge Nurse/RN Notified 0010  Notify: Provider  Provider Name/Title  Angie Fava MD)  Date Provider Notified 05/10/22  Time Provider Notified 0142  Method of Notification Page (secure chat)  Notification Reason Change in status;Other (Comment) (Yellow MEWS score)  Provider response No new orders  Date of Provider Response 05/10/22  Time of Provider Response 0142  Document  Patient Outcome Not stable and remains on department;Other (Comment) (stable now)  Assess: SIRS CRITERIA  SIRS Temperature  1  SIRS Pulse 1  SIRS Respirations  0  SIRS WBC 0  SIRS Score Sum  2

## 2022-05-10 NOTE — Progress Notes (Signed)
   Balance of evidence c/w hepatic abscesses. Plans for ID consult and IR aspiration/drainage.  D/w patient and Dr. Thedore Mins. Signing off  Iva Boop, MD, [facility] See Loretha Stapler on call - gastroenterology for best contact person 05/10/2022 11:58 AM

## 2022-05-10 NOTE — Procedures (Signed)
Interventional Radiology Procedure Note  Procedure: Ultrasound guided hepatic fluid collection aspiration  Findings: Please refer to procedural dictation for full description. Right lobe abscess aspirated yielding approximately 5 mL purulent fluid.  Sample sent for culture.  Complications: None immediate  Estimated Blood Loss: < 5 mL  Recommendations: Follow up culture results.   Marliss Coots, MD

## 2022-05-10 NOTE — Consult Note (Signed)
[facility]       Reason for Consult: hepatic abscess    Referring Physician: Dr. Thedore Mins  Principal Problem:   Sepsis [facility]) Active Problems:   Narcolepsy without cataplexy   Gastroesophageal reflux disease   Hypertriglyceridemia   Alcohol use    fluticasone  2 spray Each Nare Daily   methylphenidate  10 mg Oral BID   omega-3 acid ethyl esters  1 g Oral Daily   pantoprazole (PROTONIX) IV  40 mg Intravenous Q24H    Recommendations: Continue ceftriaxone + metronidazole Will monitor cultures Will consider 3-4 weeks of amoxicillin/clavulanate at discharge, depending on cultures  Assessment: He has multiple liver abscesses and to get drainage today.  Typically can take 3-4 weeks for resolution.   Antibiotics: Ceftriaxone + metronidazole  HPI: Samuel Patterson is a 51 y.o. male with a history of GERD, diverticulosis, hypertriglyceridemia came in with 3 days of fever, headache and some chills.  Fever up to 102.6.  Seen by his PCP and considered a viral illness.  Some nausea.  Vomited x 1 after some medication.  CT then MRI c/w multiple liver abscesses.  Never had before.  No recent travel.  Plan for IR drainage today.    Review of Systems:  Constitutional: positive for fevers and chills All other systems reviewed and are negative    Past Medical History:  Diagnosis Date   Allergy    vasomotor rhinitis   GERD (gastroesophageal reflux disease)    Narcolepsy     Social History   Tobacco Use   Smoking status: Never   Smokeless tobacco: Never  Vaping Use   Vaping Use: Never used  Substance Use Topics   Alcohol use: Yes    Alcohol/week: 14.0 standard drinks of alcohol    Types: 14 Cans of beer per week    Comment: 2 beers daily   Drug use: No    Family History  Problem Relation Age of Onset   Cancer Mother        Breast   Dementia Mother    Heart disease Father    Colon polyps Father    Heart disease Brother 64       MI    Parkinson's disease Maternal Grandfather    Diabetes Paternal Grandmother    Stroke Paternal Grandmother    Heart disease Paternal Grandfather    Colon cancer Neg Hx    Esophageal cancer Neg Hx    Rectal cancer Neg Hx    Stomach cancer Neg Hx     No Known Allergies  Physical Exam: Constitutional: in no apparent distress  Vitals:   05/10/22 0835 05/10/22 1147  BP: 118/81 125/81  Pulse: 85 95  Resp: 18 18  Temp: 98.2 F (36.8 C) 98.5 F (36.9 C)  SpO2: 98% 96%   EYES: anicteric Respiratory: normal respiratory effort GI: soft   Lab Results  Component Value Date   WBC 9.1 05/10/2022   HGB 11.7 (L) 05/10/2022   HCT 35.5 (L) 05/10/2022   MCV 90.8 05/10/2022   PLT 110 (L) 05/10/2022    Lab Results  Component Value Date   CREATININE 0.98 05/10/2022   BUN 8 05/10/2022   NA 133 (L) 05/10/2022   K 3.2 (L) 05/10/2022   CL 102 05/10/2022   CO2 22 05/10/2022    Lab Results  Component Value Date   ALT 191 (H) 05/10/2022   AST 44 (H) 05/10/2022   ALKPHOS 175 (H) 05/10/2022  Microbiology: Recent Results (from the past 240 hour(s))  SARS Coronavirus 2 by RT PCR (hospital order, performed in [facility] hospital lab) *cepheid single result test* Anterior Nasal Swab     Status: None   Collection Time: 05/09/22  5:43 AM   Specimen: Anterior Nasal Swab  Result Value Ref Range Status   SARS Coronavirus 2 by RT PCR NEGATIVE NEGATIVE Final    Comment: (NOTE) SARS-CoV-2 target nucleic acids are NOT DETECTED.  The SARS-CoV-2 RNA is generally detectable in upper and lower respiratory specimens during the acute phase of infection. The lowest concentration of SARS-CoV-2 viral copies this assay can detect is 250 copies / mL. A negative result does not preclude SARS-CoV-2 infection and should not be used as the sole basis for treatment or other patient management decisions.  A negative result may occur with improper specimen collection / handling, submission of specimen  other than nasopharyngeal swab, presence of viral mutation(s) within the areas targeted by this assay, and inadequate number of viral copies (<250 copies / mL). A negative result must be combined with clinical observations, patient history, and epidemiological information.  Fact Sheet for Patients:   RoadLapTop.co.za  Fact Sheet for Healthcare Providers: http://kim-miller.com/  This test is not yet approved or  cleared by the [place] FDA and has been authorized for detection and/or diagnosis of SARS-CoV-2 by FDA under an Emergency Use Authorization (EUA).  This EUA will remain in effect (meaning this test can be used) for the duration of the COVID-19 declaration under Section 564(b)(1) of the Act, 21 U.S.C. section 360bbb-3(b)(1), unless the authorization is terminated or revoked sooner.  Performed at [facility], [address], [place], [place] [address]   Blood culture (routine x 2)     Status: None (Preliminary result)   Collection Time: 05/09/22  8:38 AM   Specimen: BLOOD RIGHT ARM  Result Value Ref Range Status   Specimen Description BLOOD RIGHT ARM  Final   Special Requests   Final    BOTTLES DRAWN AEROBIC AND ANAEROBIC Blood Culture results may not be optimal due to an inadequate volume of blood received in culture bottles   Culture   Final    NO GROWTH < 24 HOURS Performed at [facility], [address] [place], [place] [address]    Report Status PENDING  Incomplete  Blood culture (routine x 2)     Status: None (Preliminary result)   Collection Time: 05/09/22  8:43 AM   Specimen: BLOOD RIGHT HAND  Result Value Ref Range Status   Specimen Description BLOOD RIGHT HAND  Final   Special Requests   Final    BOTTLES DRAWN AEROBIC AND ANAEROBIC Blood Culture adequate volume   Culture   Final    NO GROWTH < 24 HOURS Performed at [facility], [address] [place], [place] [address]    Report  Status PENDING  Incomplete  MRSA Next Gen by PCR, Nasal     Status: None   Collection Time: 05/10/22  6:31 AM   Specimen: Nasal Mucosa; Nasal Swab  Result Value Ref Range Status   MRSA by PCR Next Gen NOT DETECTED NOT DETECTED Final    Comment: (NOTE) The GeneXpert MRSA Assay (FDA approved for NASAL specimens only), is one component of a comprehensive MRSA colonization surveillance program. It is not intended to diagnose MRSA infection nor to guide or monitor treatment for MRSA infections. Test performance is not FDA approved in patients less  than 54 years old. Performed at [facility], [address] [place], [place] [address]     Gardiner Barefoot, MD [facility] [email or web address] 05/10/2022, 1:50 PM

## 2022-05-10 NOTE — Progress Notes (Signed)
PROGRESS NOTE                                                                                                                                                                                                             Patient Demographics:    Samuel Patterson, is a 51 y.o. male, DOB - 1971/07/27, MBW:[id number]  Outpatient Primary MD for the patient is Loyola Mast, MD    LOS - 0  Admit date - 05/09/2022    Chief Complaint  Patient presents with   Fever   Vomiting       Brief Narrative (HPI from H&P)    51 y.o. male with history h/o GERD, diverticulosis, hypertriglyceridemia presents with persistent fevers over the last few days. Was evaluated by PCP on 6/13, had fever 102.6 F and felt to have viral illness possible, advised to take OTC antipyretics and encourage fluid intake.  Patient reports continued poor oral intake, high fevers with Tmax of 103F yesterday at home along with nausea, vomiting x1 this morning prompting ED visit, further work-up suggest liver abscess.   Subjective:    Samuel Patterson today has, No headache, No chest pain, No abdominal pain - No Nausea, No new weakness tingling or numbness, no SOB.   Assessment  & Plan :    Sepsis due to liver abscess. No clear source, no recent travel, no history of GI issues or gallbladder issues in the past, remarkably pain-free and nontender.  GI, general surgery and ID called.  Requested IR to drain at least the bigger fluid collections if possible.  Continue IV antibiotics most likely will require a PICC line with prolonged IV antibiotics well will defer this to ID.  Abscess pathophysiology seems to have resolved.   2.  Transaminitis.  Due to #1 above.  3.  Radiological question of acute appendicitis on CT scan.  Clinically ruled out, also cleared by general surgery.  4.  GERD.  PPI  5.  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  6.  Hypertriglyceridemia.  On fish oil.         Condition - Extremely Guarded  Family Communication  :  wife bedside 05/10/22  Code Status :  Full  Consults  :  GI, CCS, IR, ID  PUD Prophylaxis : PPI   Procedures  :     MRI - 1.  Multiple liver lesions scattered throughout the hepatic parenchyma with imaging characteristics most indicative of hepatic abscesses. Several of these have some large perfusion anomalies associated with them. Repeat abdominal MRI is recommended in 3 months to ensure regression of these findings following therapy      Disposition Plan  :    Status is: Observation   DVT Prophylaxis  :    SCDs Start: 05/09/22 1514   Lab Results  Component Value Date   PLT 110 (L) 05/10/2022    Diet :  Diet Order             Diet clear liquid Room service appropriate? Yes; Fluid consistency: Thin  Diet effective now                    Inpatient Medications  Scheduled Meds:  fluticasone  2 spray Each Nare Daily   methylphenidate  10 mg Oral BID   omega-3 acid ethyl esters  1 g Oral Daily   pantoprazole (PROTONIX) IV  40 mg Intravenous Q24H   Continuous Infusions:  cefTRIAXone (ROCEPHIN)  IV     lactated ringers 1,000 mL with potassium chloride 20 mEq infusion 100 mL/hr at 05/10/22 [id number]   metronidazole 500 mg (05/10/22 0102)   PRN Meds:.acetaminophen, albuterol, ibuprofen, ondansetron (ZOFRAN) IV  Antibiotics  :    Anti-infectives (From admission, onward)    Start     Dose/Rate Route Frequency Ordered Stop   05/10/22 1400  cefTRIAXone (ROCEPHIN) 2 g in sodium chloride 0.9 % 100 mL IVPB        2 g 200 mL/hr over 30 Minutes Intravenous Every 24 hours 05/09/22 1511     05/09/22 1245  metroNIDAZOLE (FLAGYL) IVPB 500 mg        500 mg 100 mL/hr over 60 Minutes Intravenous Every 12 hours 05/09/22 1230     05/09/22 1245  cefTRIAXone (ROCEPHIN) 2 g in sodium chloride 0.9 % 100 mL IVPB        2 g 200 mL/hr over 30 Minutes Intravenous  Once 05/09/22 1230 05/09/22 1550        Time Spent in  minutes  30   Lala Lund M.D on 05/10/2022 at 8:30 AM  To page go to [email or web address]   [facility] -  Office  [phone]  See all Orders from today for further details    Objective:   Vitals:   05/09/22 2100 05/10/22 0048 05/10/22 0250 05/10/22 0623  BP: 127/86 (!) 159/82 114/60 120/76  Pulse: (!) 115 (!) 124 (!) 103 88  Resp: 18 18 17 17   Temp: (!) 100.4 F (38 C) (!) 101 F (38.3 C) (!) 100.6 F (38.1 C) 98.7 F (37.1 C)  TempSrc: Oral Oral Oral Oral  SpO2: 100% 100% 96% 97%    Wt Readings from Last 3 Encounters:  05/06/22 96 kg  01/30/22 100.2 kg  12/13/21 97.5 kg     Intake/Output Summary (Last 24 hours) at 05/10/2022 0830 Last data filed at 05/10/2022 [id number] Gross per 24 hour  Intake 2811.59 ml  Output --  Net 2811.59 ml     Physical Exam  Awake Alert, No new F.N deficits, Normal affect [place].AT,PERRAL Supple Neck, No JVD,   Symmetrical Chest wall movement, Good air movement bilaterally, CTAB RRR,No Gallops,Rubs or new Murmurs,  +ve B.Sounds, Abd Soft, No tenderness,   No Cyanosis, Clubbing or edema      Data Review:    CBC Recent Labs  Lab 05/09/22 0556 05/10/22 0033  WBC 14.3* 9.1  HGB 13.3 11.7*  HCT 38.9* 35.5*  PLT 159 110*  MCV 90.3 90.8  MCH 30.9 29.9  MCHC 34.2 33.0  RDW 12.8 13.2    Electrolytes Recent Labs  Lab 05/09/22 0556 05/09/22 1609 05/09/22 1636 05/10/22 0033  NA 135  --   --  133*  K 3.2*  --   --  3.2*  CL 99  --   --  102  CO2 22  --   --  22  GLUCOSE 171*  --   --  131*  BUN 10  --   --  8  CREATININE 1.04  --   --  0.98  CALCIUM 8.4*  --   --  8.0*  AST 101*  --   --  44*  ALT 308*  --   --  191*  ALKPHOS 242*  --   --  175*  BILITOT 3.5*  --   --  1.8*  ALBUMIN 2.9*  --   --  2.7*  PROCALCITON  --   --  6.01  --   LATICACIDVEN  --  1.3  --   --   INR  --   --  1.1  --       Radiology Reports MR ABDOMEN W WO CONTRAST  Result Date: 05/10/2022 CLINICAL DATA:  51 year old male with  history of fever, nausea and vomiting. Multiple indeterminate liver lesions noted on CT examination. Follow-up study. EXAM: MRI ABDOMEN WITHOUT AND WITH CONTRAST TECHNIQUE: Multiplanar multisequence MR imaging of the abdomen was performed both before and after the administration of intravenous contrast. CONTRAST:  5mL GADAVIST GADOBUTROL 1 MMOL/ML IV SOLN COMPARISON:  No prior abdominal MRI. CT the abdomen and pelvis 05/09/2022. FINDINGS: Lower chest: Unremarkable. Hepatobiliary: Scattered throughout the liver there are multiple T1 hypointense, T2 hyperintense lesions which are centrally hypovascular but demonstrate peripheral rim enhancement and several demonstrate surrounding perfusion anomalies. These lesions all restrict diffusion. The largest of these lesions is in segment 6 (axial image 32 of series 4 and coronal image 10 of series 3) measuring 3.9 x 2.5 x 3.0 cm. No intrahepatic biliary ductal dilatation. Gallbladder is unremarkable in appearance. Pancreas: No pancreatic mass. No pancreatic ductal dilatation. No pancreatic or peripancreatic fluid collections or inflammatory changes. Spleen:  Unremarkable. Adrenals/Urinary Tract: Bilateral kidneys and bilateral adrenal glands are normal in appearance. No hydroureteronephrosis in the visualized portions of the abdomen. Stomach/Bowel: Visualized portions are unremarkable. Vascular/Lymphatic: No aneurysm identified in the visualized abdominal vasculature. No lymphadenopathy noted in the abdomen. Other: No significant volume of ascites noted in the visualized portions of the peritoneal cavity. Musculoskeletal: No aggressive appearing osseous lesions are noted in the visualized portions of the skeleton. IMPRESSION: 1. Multiple liver lesions scattered throughout the hepatic parenchyma with imaging characteristics most indicative of hepatic abscesses. Several of these have some large perfusion anomalies associated with them. Repeat abdominal MRI is recommended in 3  months to ensure regression of these findings following therapy. Electronically Signed   By: Trudie Reed M.D.   On: 05/10/2022 06:52   CT ABDOMEN PELVIS W CONTRAST  Result Date: 05/09/2022 CLINICAL DATA:  Nausea and vomiting.  Fever. EXAM: CT ABDOMEN AND PELVIS WITH CONTRAST TECHNIQUE: Multidetector CT imaging of the abdomen and pelvis was performed using the standard protocol following bolus administration of intravenous contrast. RADIATION DOSE REDUCTION: This exam was performed according to the departmental dose-optimization program which includes automated exposure control, adjustment of the mA and/or kV according to patient  size and/or use of iterative reconstruction technique. CONTRAST:  133mL OMNIPAQUE IOHEXOL 300 MG/ML  SOLN COMPARISON:  Abdominal ultrasound 05/09/2022 FINDINGS: Lower chest: Dependent subsegmental atelectasis in both lower lobes. Hepatobiliary: Suspected hepatic steatosis. Scattered hypodense renal lesions are present, these have greater than fluid density and some have marginal irregularity or nodularity such as the hypodense lesion on image 21 of series 3 which measures 2.4 by 1.5 cm, and the hypodense lesion on image 32 of series 3 which measures 3.9 by 2.5 cm. Accordingly these lesions are nonspecific, hepatic protocol MRI with and without contrast is once again recommended for definitive characterization. The possibility of hepatic abscesses is not excluded in this patient with fever although the seem to have less marginal enhancement than I would expect for abscesses. No biliary dilatation. Pancreas: Unremarkable Spleen: Unremarkable Adrenals/Urinary Tract: Unremarkable Stomach/Bowel: The appendix is poorly seen but appears to extend from the cecum on images 49 through 57 of series 7, this structure has a diameter of 1.0 cm reason the possibility of acute appendicitis. The distal margin of the structures obscured by bowel loops. Vascular/Lymphatic: Unremarkable Reproductive:  Unremarkable Other: No supplemental non-categorized findings. Musculoskeletal: Likely physiologic anterior wedging at T11. IMPRESSION: 1. Multiple complex hepatic lesions, measuring higher than fluid density, some with somewhat irregular or nodular margins. Hepatic abscesses are a possibility in this patient with fever. Other types of mass cannot be excluded. Metastatic disease to the liver is not completely excluded. Recommend hepatic protocol MRI with and without contrast. 2. A retrocecal tubular structure thought to probably represent appendix is thickened at 1.0 cm in diameter. The distal margin of this structure is obscured by adjacent loops of bowel. The appearance raises suspicion for potential acute appendicitis. Correlate with right lower quadrant tenderness or other characteristic physical exam findings. 3. Hepatic steatosis. Electronically Signed   By: Van Clines M.D.   On: 05/09/2022 11:57   US Abdomen Limited RUQ (LIVER/GB)  Result Date: 05/09/2022 CLINICAL DATA:  Provided history: Pain. Jaundice. EXAM: ULTRASOUND ABDOMEN LIMITED RIGHT UPPER QUADRANT COMPARISON:  No pertinent prior exams available for comparison. FINDINGS: Gallbladder: No gallstones or wall thickening visualized. No sonographic Murphy sign noted by sonographer. Common bile duct: Diameter: 2-3 mm, within normal limits. Liver: There are multiple round to ovoid hypoechoic lesions within the right hepatic lobe, measuring 2.4 x 2.9 x 1.8 cm, 1.5 x 2.1 x 1.4 cm, 2.5 x 1.6 x 2.3 cm and 1.5 x 1.3 x 1.6 cm. There is background increased hepatic parenchymal echogenicity. Portal vein is patent on color Doppler imaging with normal direction of blood flow towards the liver. Impression #1 will be called to the ordering clinician or representative by the Radiologist Assistant, and communication documented in the PACS or [employer]. IMPRESSION: 1. Multiple round to ovoid hypoechoic lesions within the right hepatic lobe, measuring up  to 2.4 x 2.9 x 1.8 cm. These lesions may reflect cysts and/or masses. A liver protocol abdominal MRI without and with contrast is recommended for further evaluation. 2. Background increased hepatic parenchymal echogenicity. This is a nonspecific finding, which may be seen in the setting of hepatic steatosis or other chronic hepatic parenchymal disease. 3. Otherwise unremarkable right upper quadrant ultrasound, as described. Electronically Signed   By: Kellie Simmering D.O.   On: 05/09/2022 10:33   DG Chest 2 View  Result Date: 05/09/2022 CLINICAL DATA:  A 51 year old male presents with fever. EXAM: CHEST - 2 VIEW COMPARISON:  None Available. FINDINGS: Trachea midline. Cardiomediastinal contours and hilar structures  are normal. No sign of pleural effusion. On limited assessment no acute skeletal findings. There is a fracture of the distal third of the LEFT clavicle which may be subacute or chronic. IMPRESSION: 1. No acute cardiopulmonary disease. 2. Fracture of the distal third of the LEFT clavicle which may be subacute or chronic. Correlate with any pain in this area with patient history. Electronically Signed   By: Zetta Bills M.D.   On: 05/09/2022 09:23

## 2022-05-11 ENCOUNTER — Encounter (HOSPITAL_COMMUNITY): Payer: Self-pay | Admitting: Internal Medicine

## 2022-05-11 ENCOUNTER — Other Ambulatory Visit: Payer: Self-pay

## 2022-05-11 DIAGNOSIS — K769 Liver disease, unspecified: Secondary | ICD-10-CM | POA: Diagnosis not present

## 2022-05-11 LAB — CBC WITH DIFFERENTIAL/PLATELET
Abs Immature Granulocytes: 0.16 10*3/uL — ABNORMAL HIGH (ref 0.00–0.07)
Basophils Absolute: 0 10*3/uL (ref 0.0–0.1)
Basophils Relative: 0 %
Eosinophils Absolute: 0 10*3/uL (ref 0.0–0.5)
Eosinophils Relative: 0 %
HCT: 32.9 % — ABNORMAL LOW (ref 39.0–52.0)
Hemoglobin: 11.2 g/dL — ABNORMAL LOW (ref 13.0–17.0)
Immature Granulocytes: 1 %
Lymphocytes Relative: 6 %
Lymphs Abs: 0.8 10*3/uL (ref 0.7–4.0)
MCH: 30.4 pg (ref 26.0–34.0)
MCHC: 34 g/dL (ref 30.0–36.0)
MCV: 89.2 fL (ref 80.0–100.0)
Monocytes Absolute: 1.2 10*3/uL — ABNORMAL HIGH (ref 0.1–1.0)
Monocytes Relative: 10 %
Neutro Abs: 10.2 10*3/uL — ABNORMAL HIGH (ref 1.7–7.7)
Neutrophils Relative %: 83 %
Platelets: 125 10*3/uL — ABNORMAL LOW (ref 150–400)
RBC: 3.69 MIL/uL — ABNORMAL LOW (ref 4.22–5.81)
RDW: 13.2 % (ref 11.5–15.5)
WBC: 12.3 10*3/uL — ABNORMAL HIGH (ref 4.0–10.5)
nRBC: 0 % (ref 0.0–0.2)

## 2022-05-11 LAB — COMPREHENSIVE METABOLIC PANEL
ALT: 148 U/L — ABNORMAL HIGH (ref 0–44)
AST: 46 U/L — ABNORMAL HIGH (ref 15–41)
Albumin: 2.3 g/dL — ABNORMAL LOW (ref 3.5–5.0)
Alkaline Phosphatase: 147 U/L — ABNORMAL HIGH (ref 38–126)
Anion gap: 5 (ref 5–15)
BUN: 8 mg/dL (ref 6–20)
CO2: 23 mmol/L (ref 22–32)
Calcium: 8.1 mg/dL — ABNORMAL LOW (ref 8.9–10.3)
Chloride: 107 mmol/L (ref 98–111)
Creatinine, Ser: 0.78 mg/dL (ref 0.61–1.24)
GFR, Estimated: >60 mL/min (ref >60)
Glucose, Bld: 119 mg/dL — ABNORMAL HIGH (ref 70–99)
Potassium: 3.5 mmol/L (ref 3.5–5.1)
Sodium: 135 mmol/L (ref 135–145)
Total Bilirubin: 1 mg/dL (ref 0.3–1.2)
Total Protein: 5.7 g/dL — ABNORMAL LOW (ref 6.5–8.1)

## 2022-05-11 LAB — MAGNESIUM: Magnesium: 1.9 mg/dL (ref 1.7–2.4)

## 2022-05-11 LAB — PROCALCITONIN: Procalcitonin: 3.95 ng/mL

## 2022-05-11 MED ORDER — POTASSIUM CHLORIDE CRYS ER 20 MEQ PO TBCR
40.0000 meq | EXTENDED_RELEASE_TABLET | Freq: Once | ORAL | Status: AC
  Administered 2022-05-11: 40 meq via ORAL
  Filled 2022-05-11 (×2): qty 2

## 2022-05-11 MED ORDER — POTASSIUM CHLORIDE 2 MEQ/ML IV SOLN
INTRAVENOUS | Status: DC
  Filled 2022-05-11: qty 1000

## 2022-05-11 MED ORDER — TRAMADOL HCL 50 MG PO TABS
50.0000 mg | ORAL_TABLET | Freq: Four times a day (QID) | ORAL | Status: DC | PRN
  Administered 2022-05-11 – 2022-05-14 (×6): 50 mg via ORAL
  Filled 2022-05-11 (×6): qty 1

## 2022-05-11 MED ORDER — ACETAMINOPHEN 650 MG RE SUPP
650.0000 mg | Freq: Three times a day (TID) | RECTAL | Status: DC | PRN
Start: 2022-05-11 — End: 2022-05-11

## 2022-05-11 MED ORDER — SODIUM CHLORIDE 0.9 % IV SOLN
25.0000 mg | Freq: Three times a day (TID) | INTRAVENOUS | Status: DC | PRN
  Filled 2022-05-11: qty 1

## 2022-05-11 MED ORDER — PANTOPRAZOLE SODIUM 40 MG PO TBEC
40.0000 mg | DELAYED_RELEASE_TABLET | Freq: Every day | ORAL | Status: DC
  Administered 2022-05-11 – 2022-05-15 (×5): 40 mg via ORAL
  Filled 2022-05-11 (×5): qty 1

## 2022-05-11 MED ORDER — ACETAMINOPHEN 325 MG PO TABS
650.0000 mg | ORAL_TABLET | Freq: Four times a day (QID) | ORAL | Status: DC | PRN
  Administered 2022-05-11 – 2022-05-14 (×6): 650 mg via ORAL
  Filled 2022-05-11 (×6): qty 2

## 2022-05-11 NOTE — Plan of Care (Signed)
  Problem: Education: Goal: Knowledge of General Education information will improve Description Including pain rating scale, medication(s)/side effects and non-pharmacologic comfort measures Outcome: Progressing   Problem: Health Behavior/Discharge Planning: Goal: Ability to manage health-related needs will improve Outcome: Progressing   

## 2022-05-11 NOTE — Progress Notes (Signed)
PROGRESS NOTE                                                                                                                                                                                                             Patient Demographics:    Samuel Patterson, is a 51 y.o. male, DOB - [birth year], MEQ:[id number]  Outpatient Primary MD for the patient is Rudd, Bertram Millard, MD    LOS - 1  Admit date - 05/09/2022    Chief Complaint  Patient presents with   Fever   Vomiting       Brief Narrative (HPI from H&P)    51 y.o. male with history h/o GERD, diverticulosis, hypertriglyceridemia presents with persistent fevers over the last few days. Was evaluated by PCP on 6/13, had fever 102.6 F and felt to have viral illness possible, advised to take OTC antipyretics and encourage fluid intake.  Patient reports continued poor oral intake, high fevers with Tmax of 103F yesterday at home along with nausea, vomiting x1 this morning prompting ED visit, further work-up suggest liver abscess.   Subjective:   Patient in bed, appears comfortable, + fever and mild headache, no fever, no chest pain or pressure, no shortness of breath , no abdominal pain. No new focal weakness.   Assessment  & Plan :    Sepsis due to liver abscess. No clear source, no recent travel, no history of GI issues or gallbladder issues in the past, remarkably pain-free and nontender.  GI, general surgery and ID called.  Seen by IR and underwent right lobe abscess aspiration with 5 cc of fluid removed on 05/10/2022.  Continue IV antibiotics most likely will require a PICC line with prolonged IV antibiotics well will defer this to ID.  Sepsis pathophysiology has improved, defer antibiotics course and duration to ID.  Still running fevers although clinically appears stable and nontoxic.  2.  Transaminitis.  Due to #1 above.  3.  Radiological question of acute appendicitis on CT  scan.  Clinically ruled out, also cleared by general surgery.  4.  GERD.  PPI  5.  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  6.  Hypertriglyceridemia.  On fish oil.        Condition - Extremely Guarded  Family Communication  :  wife bedside 05/10/22  Code Status :  Full  Consults  :  GI, CCS, IR, ID  PUD Prophylaxis : PPI   Procedures  :     By IR on 05/10/2022.  -  Ultrasound guided hepatic fluid collection aspiration, 5 cc fluid removed from right lobe abscess.  MRI - 1. Multiple liver lesions scattered throughout the hepatic parenchyma with imaging characteristics most indicative of hepatic abscesses. Several of these have some large perfusion anomalies associated with them. Repeat abdominal MRI is recommended in 3 months to ensure regression of these findings following therapy.      Disposition Plan  :    Status is: Observation   DVT Prophylaxis  :    SCDs Start: 05/09/22 1514   Lab Results  Component Value Date   PLT 125 (L) 05/11/2022    Diet :  Diet Order             DIET SOFT Room service appropriate? Yes; Fluid consistency: Thin  Diet effective now                    Inpatient Medications  Scheduled Meds:  fluticasone  2 spray Each Nare Daily   methylphenidate  10 mg Oral BID   omega-3 acid ethyl esters  1 g Oral Daily   pantoprazole  40 mg Oral Daily   potassium chloride  40 mEq Oral Once   Continuous Infusions:  cefTRIAXone (ROCEPHIN)  IV Stopped (05/10/22 1453)   lactated ringers 1,000 mL with potassium chloride 20 mEq infusion     metronidazole 500 mg (05/11/22 0107)   promethazine (PHENERGAN) injection (IM or IVPB)     PRN Meds:.acetaminophen, albuterol, fentaNYL, ibuprofen, midazolam, ondansetron (ZOFRAN) IV, promethazine (PHENERGAN) injection (IM or IVPB), traMADol  Antibiotics  :    Anti-infectives (From admission, onward)    Start     Dose/Rate Route Frequency Ordered Stop   05/10/22 1400  cefTRIAXone (ROCEPHIN) 2 g in sodium chloride 0.9  % 100 mL IVPB        2 g 200 mL/hr over 30 Minutes Intravenous Every 24 hours 05/09/22 1511     05/09/22 1245  metroNIDAZOLE (FLAGYL) IVPB 500 mg        500 mg 100 mL/hr over 60 Minutes Intravenous Every 12 hours 05/09/22 1230     05/09/22 1245  cefTRIAXone (ROCEPHIN) 2 g in sodium chloride 0.9 % 100 mL IVPB        2 g 200 mL/hr over 30 Minutes Intravenous  Once 05/09/22 1230 05/09/22 1550        Time Spent in minutes  30   Susa Raring M.D on 05/11/2022 at 9:08 AM  To page go to [email or web address]   [facility] -  Office  [phone]  See all Orders from today for further details    Objective:   Vitals:   05/10/22 1730 05/10/22 1735 05/10/22 2132 05/11/22 0548  BP: 135/81 138/84 131/89 (!) 151/97  Pulse: (!) 107 (!) 111 91 100  Resp: 16  17 17   Temp:   98.6 F (37 C) (!) 102.3 F (39.1 C)  TempSrc:   Oral Oral  SpO2: 97% 96% 97% 98%  Weight:   96 kg   Height:   5\' 11"  (1.803 m)     Wt Readings from Last 3 Encounters:  05/10/22 96 kg  05/06/22 96 kg  01/30/22 100.2 kg     Intake/Output Summary (Last 24 hours) at 05/11/2022 0908 Last data filed at 05/11/2022 0057 Gross per 24 hour  Intake 2547.86 ml  Output --  Net 2547.86 ml     Physical Exam  Awake Alert, No new F.N deficits, Normal affect [place].AT,PERRAL Supple Neck, No JVD,   Symmetrical Chest wall movement, Good air movement bilaterally, CTAB RRR,No Gallops, Rubs or new Murmurs,  +ve B.Sounds, Abd Soft, No tenderness,   No Cyanosis, Clubbing or edema    Data Review:    CBC Recent Labs  Lab 05/09/22 0556 05/10/22 0033 05/11/22 0008  WBC 14.3* 9.1 12.3*  HGB 13.3 11.7* 11.2*  HCT 38.9* 35.5* 32.9*  PLT 159 110* 125*  MCV 90.3 90.8 89.2  MCH 30.9 29.9 30.4  MCHC 34.2 33.0 34.0  RDW 12.8 13.2 13.2  LYMPHSABS  --   --  0.8  MONOABS  --   --  1.2*  EOSABS  --   --  0.0  BASOSABS  --   --  0.0    Electrolytes Recent Labs  Lab 05/09/22 0556 05/09/22 1609 05/09/22 1636  05/10/22 0033 05/11/22 0008  NA 135  --   --  133* 135  K 3.2*  --   --  3.2* 3.5  CL 99  --   --  102 107  CO2 22  --   --  22 23  GLUCOSE 171*  --   --  131* 119*  BUN 10  --   --  8 8  CREATININE 1.04  --   --  0.98 0.78  CALCIUM 8.4*  --   --  8.0* 8.1*  AST 101*  --   --  44* 46*  ALT 308*  --   --  191* 148*  ALKPHOS 242*  --   --  175* 147*  BILITOT 3.5*  --   --  1.8* 1.0  ALBUMIN 2.9*  --   --  2.7* 2.3*  MG  --   --   --   --  1.9  PROCALCITON  --   --  6.01 4.68 3.95  LATICACIDVEN  --  1.3  --   --   --   INR  --   --  1.1  --   --       Radiology Reports IR US Guide Bx Asp/Drain  Result Date: 05/11/2022 INDICATION: 51 year old male with multifocal hepatic abscesses. EXAM: Ultrasound-guided hepatic abscess aspiration MEDICATIONS: The patient is currently admitted to the hospital and receiving intravenous antibiotics. The antibiotics were administered within an appropriate time frame prior to the initiation of the procedure. ANESTHESIA/SEDATION: Fentanyl 100 mcg IV; Versed 2 mg IV Moderate Sedation Time:  10 The patient was continuously monitored during the procedure by the interventional radiology nurse under my direct supervision. COMPLICATIONS: None immediate. PROCEDURE: Informed written consent was obtained from the patient after a thorough discussion of the procedural risks, benefits and alternatives. All questions were addressed. Maximal Sterile Barrier Technique was utilized including caps, mask, sterile gowns, sterile gloves, sterile drape, hand hygiene and skin antiseptic. A timeout was performed prior to the initiation of the procedure. Preprocedure ultrasound evaluation of the right upper quadrant demonstrated multifocal hypoechoic rounded lesions throughout the hepatic parenchyma, most measuring 1 cm or less. An approximately 1.9 cm collection in the right lateral liver was targeted for aspiration. Subdermal Local anesthesia was provided at the planned needle entry  site with 1% lidocaine. Under direct ultrasound visualization, additional local anesthetic was administered about the hepatic capsule. Under direct ultrasound visualization, a 19 gauge, 10 cm Yueh needle was advanced to the central aspect of the fluid collection. Aspiration  was performed yielding approximately 5 mL of purulence fluid. The sample was sent to the lab for culture. The needle was removed. Postprocedure ultrasound demonstrated no evidence of perihepatic hematoma or other complicating features. A sterile bandage was applied. The patient was returned to the floor in stable condition. IMPRESSION: Technically successful ultrasound-guided aspiration of hepatic abscess in the right lobe of the liver. Dylan Suttle, MD [facility] and Interventional RadiologyMarliss [facility] Electronically Signed   By: Marliss Cootsylan  Suttle M.D.   On: 05/11/2022 00:31   MR ABDOMEN W WO CONTRAST  Result Date: 05/10/2022 CLINICAL DATA:  51 year old male with history of fever, nausea and vomiting. Multiple indeterminate liver lesions noted on CT examination. Follow-up study. EXAM: MRI ABDOMEN WITHOUT AND WITH CONTRAST TECHNIQUE: Multiplanar multisequence MR imaging of the abdomen was performed both before and after the administration of intravenous contrast. CONTRAST:  10mL GADAVIST GADOBUTROL 1 MMOL/ML IV SOLN COMPARISON:  No prior abdominal MRI. CT the abdomen and pelvis 05/09/2022. FINDINGS: Lower chest: Unremarkable. Hepatobiliary: Scattered throughout the liver there are multiple T1 hypointense, T2 hyperintense lesions which are centrally hypovascular but demonstrate peripheral rim enhancement and several demonstrate surrounding perfusion anomalies. These lesions all restrict diffusion. The largest of these lesions is in segment 6 (axial image 32 of series 4 and coronal image 10 of series 3) measuring 3.9 x 2.5 x 3.0 cm. No intrahepatic biliary ductal dilatation. Gallbladder is unremarkable in appearance. Pancreas:  No pancreatic mass. No pancreatic ductal dilatation. No pancreatic or peripancreatic fluid collections or inflammatory changes. Spleen:  Unremarkable. Adrenals/Urinary Tract: Bilateral kidneys and bilateral adrenal glands are normal in appearance. No hydroureteronephrosis in the visualized portions of the abdomen. Stomach/Bowel: Visualized portions are unremarkable. Vascular/Lymphatic: No aneurysm identified in the visualized abdominal vasculature. No lymphadenopathy noted in the abdomen. Other: No significant volume of ascites noted in the visualized portions of the peritoneal cavity. Musculoskeletal: No aggressive appearing osseous lesions are noted in the visualized portions of the skeleton. IMPRESSION: 1. Multiple liver lesions scattered throughout the hepatic parenchyma with imaging characteristics most indicative of hepatic abscesses. Several of these have some large perfusion anomalies associated with them. Repeat abdominal MRI is recommended in 3 months to ensure regression of these findings following therapy. Electronically Signed   By: Trudie Reedaniel  Entrikin M.D.   On: 05/10/2022 06:52   CT ABDOMEN PELVIS W CONTRAST  Result Date: 05/09/2022 CLINICAL DATA:  Nausea and vomiting.  Fever. EXAM: CT ABDOMEN AND PELVIS WITH CONTRAST TECHNIQUE: Multidetector CT imaging of the abdomen and pelvis was performed using the standard protocol following bolus administration of intravenous contrast. RADIATION DOSE REDUCTION: This exam was performed according to the departmental dose-optimization program which includes automated exposure control, adjustment of the mA and/or kV according to patient size and/or use of iterative reconstruction technique. CONTRAST:  100mL OMNIPAQUE IOHEXOL 300 MG/ML  SOLN COMPARISON:  Abdominal ultrasound 05/09/2022 FINDINGS: Lower chest: Dependent subsegmental atelectasis in both lower lobes. Hepatobiliary: Suspected hepatic steatosis. Scattered hypodense renal lesions are present, these have  greater than fluid density and some have marginal irregularity or nodularity such as the hypodense lesion on image 21 of series 3 which measures 2.4 by 1.5 cm, and the hypodense lesion on image 32 of series 3 which measures 3.9 by 2.5 cm. Accordingly these lesions are nonspecific, hepatic protocol MRI with and without contrast is once again recommended for definitive characterization. The possibility of hepatic abscesses is not excluded in this patient with fever although the seem to have less marginal enhancement than I would  expect for abscesses. No biliary dilatation. Pancreas: Unremarkable Spleen: Unremarkable Adrenals/Urinary Tract: Unremarkable Stomach/Bowel: The appendix is poorly seen but appears to extend from the cecum on images 49 through 57 of series 7, this structure has a diameter of 1.0 cm reason the possibility of acute appendicitis. The distal margin of the structures obscured by bowel loops. Vascular/Lymphatic: Unremarkable Reproductive: Unremarkable Other: No supplemental non-categorized findings. Musculoskeletal: Likely physiologic anterior wedging at T11. IMPRESSION: 1. Multiple complex hepatic lesions, measuring higher than fluid density, some with somewhat irregular or nodular margins. Hepatic abscesses are a possibility in this patient with fever. Other types of mass cannot be excluded. Metastatic disease to the liver is not completely excluded. Recommend hepatic protocol MRI with and without contrast. 2. A retrocecal tubular structure thought to probably represent appendix is thickened at 1.0 cm in diameter. The distal margin of this structure is obscured by adjacent loops of bowel. The appearance raises suspicion for potential acute appendicitis. Correlate with right lower quadrant tenderness or other characteristic physical exam findings. 3. Hepatic steatosis. Electronically Signed   By: Gaylyn Rong M.D.   On: 05/09/2022 11:57   US Abdomen Limited RUQ (LIVER/GB)  Result Date:  05/09/2022 CLINICAL DATA:  Provided history: Pain. Jaundice. EXAM: ULTRASOUND ABDOMEN LIMITED RIGHT UPPER QUADRANT COMPARISON:  No pertinent prior exams available for comparison. FINDINGS: Gallbladder: No gallstones or wall thickening visualized. No sonographic Murphy sign noted by sonographer. Common bile duct: Diameter: 2-3 mm, within normal limits. Liver: There are multiple round to ovoid hypoechoic lesions within the right hepatic lobe, measuring 2.4 x 2.9 x 1.8 cm, 1.5 x 2.1 x 1.4 cm, 2.5 x 1.6 x 2.3 cm and 1.5 x 1.3 x 1.6 cm. There is background increased hepatic parenchymal echogenicity. Portal vein is patent on color Doppler imaging with normal direction of blood flow towards the liver. Impression #1 will be called to the ordering clinician or representative by the Radiologist Assistant, and communication documented in the PACS or [employer]. IMPRESSION: 1. Multiple round to ovoid hypoechoic lesions within the right hepatic lobe, measuring up to 2.4 x 2.9 x 1.8 cm. These lesions may reflect cysts and/or masses. A liver protocol abdominal MRI without and with contrast is recommended for further evaluation. 2. Background increased hepatic parenchymal echogenicity. This is a nonspecific finding, which may be seen in the setting of hepatic steatosis or other chronic hepatic parenchymal disease. 3. Otherwise unremarkable right upper quadrant ultrasound, as described. Electronically Signed   By: Jackey Loge D.O.   On: 05/09/2022 10:33   DG Chest 2 View  Result Date: 05/09/2022 CLINICAL DATA:  A 52 year old male presents with fever. EXAM: CHEST - 2 VIEW COMPARISON:  None Available. FINDINGS: Trachea midline. Cardiomediastinal contours and hilar structures are normal. No sign of pleural effusion. On limited assessment no acute skeletal findings. There is a fracture of the distal third of the LEFT clavicle which may be subacute or chronic. IMPRESSION: 1. No acute cardiopulmonary disease. 2. Fracture of the  distal third of the LEFT clavicle which may be subacute or chronic. Correlate with any pain in this area with patient history. Electronically Signed   By: Donzetta Kohut M.D.   On: 05/09/2022 09:23

## 2022-05-11 NOTE — Progress Notes (Signed)
   05/11/22 1148  Assess: MEWS Score  Temp 98.3 F (36.8 C)  BP (!) 131/92  MAP (mmHg) 103  Pulse Rate 100  Resp 16  Level of Consciousness Alert  SpO2 97 %  O2 Device Room Air  Assess: MEWS Score  MEWS Temp 0  MEWS Systolic 0  MEWS Pulse 0  MEWS RR 0  MEWS LOC 0  MEWS Score 0  MEWS Score Color Green  Assess: if the MEWS score is Yellow or Red  Were vital signs taken at a resting state? Yes  Focused Assessment No change from prior assessment  Does the patient meet 2 or more of the SIRS criteria? No  Does the patient have a confirmed or suspected source of infection? No  MEWS guidelines implemented *See Row Information* No, previously yellow, continue vital signs every 4 hours  Assess: SIRS CRITERIA  SIRS Temperature  0  SIRS Pulse 1  SIRS Respirations  0  SIRS WBC 1  SIRS Score Sum  2

## 2022-05-11 NOTE — Progress Notes (Signed)
Went into patient's room making my final rounds. Patient was shaking and shivering. Temp was 101.7. Gave tylenol and will notify Dr. Thedore Mins.

## 2022-05-11 NOTE — Progress Notes (Signed)
Subjective: CC: Febrile overnight. S/p IR aspiration with purulent fluid from hepatic fluid collection. ID following. Continues headache, some nausea and dry heaves. No abdominal pain.   Objective: Vital signs in last 24 hours: Temp:  [98.5 F (36.9 C)-102.3 F (39.1 C)] 102.3 F (39.1 C) (06/18 0548) Pulse Rate:  [91-111] 100 (06/18 0548) Resp:  [13-18] 17 (06/18 0548) BP: (123-151)/(62-97) 151/97 (06/18 0548) SpO2:  [96 %-98 %] 98 % (06/18 0548) Weight:  [96 kg] 96 kg (06/17 2132) Last BM Date : 05/09/22  Intake/Output from previous day: 06/17 0701 - 06/18 0700 In: 2547.9 [P.O.:120; I.V.:2227.9; IV Piggyback:200] Out: -  Intake/Output this shift: No intake/output data recorded.  PE: Gen:  Alert, NAD, pleasant Abd: Soft, ND, NT, +BS Psych: A&Ox3   Lab Results:  Recent Labs    05/10/22 0033 05/11/22 0008  WBC 9.1 12.3*  HGB 11.7* 11.2*  HCT 35.5* 32.9*  PLT 110* 125*   BMET Recent Labs    05/10/22 0033 05/11/22 0008  NA 133* 135  K 3.2* 3.5  CL 102 107  CO2 22 23  GLUCOSE 131* 119*  BUN 8 8  CREATININE 0.98 0.78  CALCIUM 8.0* 8.1*   PT/INR Recent Labs    05/09/22 1636  LABPROT 13.9  INR 1.1   CMP     Component Value Date/Time   NA 135 05/11/2022 0008   K 3.5 05/11/2022 0008   CL 107 05/11/2022 0008   CO2 23 05/11/2022 0008   GLUCOSE 119 (H) 05/11/2022 0008   BUN 8 05/11/2022 0008   CREATININE 0.78 05/11/2022 0008   CALCIUM 8.1 (L) 05/11/2022 0008   PROT 5.7 (L) 05/11/2022 0008   ALBUMIN 2.3 (L) 05/11/2022 0008   AST 46 (H) 05/11/2022 0008   ALT 148 (H) 05/11/2022 0008   ALKPHOS 147 (H) 05/11/2022 0008   BILITOT 1.0 05/11/2022 0008   GFRNONAA >60 05/11/2022 0008   Lipase     Component Value Date/Time   LIPASE 23 05/09/2022 0556    Studies/Results: IR US Guide Bx Asp/Drain  Result Date: 05/11/2022 INDICATION: 51 year old male with multifocal hepatic abscesses. EXAM: Ultrasound-guided hepatic abscess aspiration  MEDICATIONS: The patient is currently admitted to the hospital and receiving intravenous antibiotics. The antibiotics were administered within an appropriate time frame prior to the initiation of the procedure. ANESTHESIA/SEDATION: Fentanyl 100 mcg IV; Versed 2 mg IV Moderate Sedation Time:  10 The patient was continuously monitored during the procedure by the interventional radiology nurse under my direct supervision. COMPLICATIONS: None immediate. PROCEDURE: Informed written consent was obtained from the patient after a thorough discussion of the procedural risks, benefits and alternatives. All questions were addressed. Maximal Sterile Barrier Technique was utilized including caps, mask, sterile gowns, sterile gloves, sterile drape, hand hygiene and skin antiseptic. A timeout was performed prior to the initiation of the procedure. Preprocedure ultrasound evaluation of the right upper quadrant demonstrated multifocal hypoechoic rounded lesions throughout the hepatic parenchyma, most measuring 1 cm or less. An approximately 1.9 cm collection in the right lateral liver was targeted for aspiration. Subdermal Local anesthesia was provided at the planned needle entry site with 1% lidocaine. Under direct ultrasound visualization, additional local anesthetic was administered about the hepatic capsule. Under direct ultrasound visualization, a 19 gauge, 10 cm Yueh needle was advanced to the central aspect of the fluid collection. Aspiration was performed yielding approximately 5 mL of purulence fluid. The sample was sent to the lab for culture. The needle was removed.  Postprocedure ultrasound demonstrated no evidence of perihepatic hematoma or other complicating features. A sterile bandage was applied. The patient was returned to the floor in stable condition. IMPRESSION: Technically successful ultrasound-guided aspiration of hepatic abscess in the right lobe of the liver. Marliss Coots, MD [facility] Radiology Electronically Signed   By: Marliss Coots M.D.   On: 05/11/2022 00:31   MR ABDOMEN W WO CONTRAST  Result Date: 05/10/2022 CLINICAL DATA:  51 year old male with history of fever, nausea and vomiting. Multiple indeterminate liver lesions noted on CT examination. Follow-up study. EXAM: MRI ABDOMEN WITHOUT AND WITH CONTRAST TECHNIQUE: Multiplanar multisequence MR imaging of the abdomen was performed both before and after the administration of intravenous contrast. CONTRAST:  4mL GADAVIST GADOBUTROL 1 MMOL/ML IV SOLN COMPARISON:  No prior abdominal MRI. CT the abdomen and pelvis 05/09/2022. FINDINGS: Lower chest: Unremarkable. Hepatobiliary: Scattered throughout the liver there are multiple T1 hypointense, T2 hyperintense lesions which are centrally hypovascular but demonstrate peripheral rim enhancement and several demonstrate surrounding perfusion anomalies. These lesions all restrict diffusion. The largest of these lesions is in segment 6 (axial image 32 of series 4 and coronal image 10 of series 3) measuring 3.9 x 2.5 x 3.0 cm. No intrahepatic biliary ductal dilatation. Gallbladder is unremarkable in appearance. Pancreas: No pancreatic mass. No pancreatic ductal dilatation. No pancreatic or peripancreatic fluid collections or inflammatory changes. Spleen:  Unremarkable. Adrenals/Urinary Tract: Bilateral kidneys and bilateral adrenal glands are normal in appearance. No hydroureteronephrosis in the visualized portions of the abdomen. Stomach/Bowel: Visualized portions are unremarkable. Vascular/Lymphatic: No aneurysm identified in the visualized abdominal vasculature. No lymphadenopathy noted in the abdomen. Other: No significant volume of ascites noted in the visualized portions of the peritoneal cavity. Musculoskeletal: No aggressive appearing osseous lesions are noted in the visualized portions of the skeleton. IMPRESSION: 1. Multiple liver lesions scattered throughout  the hepatic parenchyma with imaging characteristics most indicative of hepatic abscesses. Several of these have some large perfusion anomalies associated with them. Repeat abdominal MRI is recommended in 3 months to ensure regression of these findings following therapy. Electronically Signed   By: Trudie Reed M.D.   On: 05/10/2022 06:52   CT ABDOMEN PELVIS W CONTRAST  Result Date: 05/09/2022 CLINICAL DATA:  Nausea and vomiting.  Fever. EXAM: CT ABDOMEN AND PELVIS WITH CONTRAST TECHNIQUE: Multidetector CT imaging of the abdomen and pelvis was performed using the standard protocol following bolus administration of intravenous contrast. RADIATION DOSE REDUCTION: This exam was performed according to the departmental dose-optimization program which includes automated exposure control, adjustment of the mA and/or kV according to patient size and/or use of iterative reconstruction technique. CONTRAST:  OMNIPAQUE IOHEXOL 300 MG/ML  SOLN COMPARISON:  Abdominal ultrasound 05/09/2022 FINDINGS: Lower chest: Dependent subsegmental atelectasis in both lower lobes. Hepatobiliary: Suspected hepatic steatosis. Scattered hypodense renal lesions are present, these have greater than fluid density and some have marginal irregularity or nodularity such as the hypodense lesion on image 21 of series 3 which measures 2.4 by 1.5 cm, and the hypodense lesion on image 32 of series 3 which measures 3.9 by 2.5 cm. Accordingly these lesions are nonspecific, hepatic protocol MRI with and without contrast is once again recommended for definitive characterization. The possibility of hepatic abscesses is not excluded in this patient with fever although the seem to have less marginal enhancement than I would expect for abscesses. No biliary dilatation. Pancreas: Unremarkable Spleen: Unremarkable Adrenals/Urinary Tract: Unremarkable Stomach/Bowel: The appendix is poorly seen but appears to  extend from the cecum on images 49 through 57 of  series 7, this structure has a diameter of 1.0 cm reason the possibility of acute appendicitis. The distal margin of the structures obscured by bowel loops. Vascular/Lymphatic: Unremarkable Reproductive: Unremarkable Other: No supplemental non-categorized findings. Musculoskeletal: Likely physiologic anterior wedging at T11. IMPRESSION: 1. Multiple complex hepatic lesions, measuring higher than fluid density, some with somewhat irregular or nodular margins. Hepatic abscesses are a possibility in this patient with fever. Other types of mass cannot be excluded. Metastatic disease to the liver is not completely excluded. Recommend hepatic protocol MRI with and without contrast. 2. A retrocecal tubular structure thought to probably represent appendix is thickened at 1.0 cm in diameter. The distal margin of this structure is obscured by adjacent loops of bowel. The appearance raises suspicion for potential acute appendicitis. Correlate with right lower quadrant tenderness or other characteristic physical exam findings. 3. Hepatic steatosis. Electronically Signed   By: Gaylyn Rong M.D.   On: 05/09/2022 11:57   US Abdomen Limited RUQ (LIVER/GB)  Result Date: 05/09/2022 CLINICAL DATA:  Provided history: Pain. Jaundice. EXAM: ULTRASOUND ABDOMEN LIMITED RIGHT UPPER QUADRANT COMPARISON:  No pertinent prior exams available for comparison. FINDINGS: Gallbladder: No gallstones or wall thickening visualized. No sonographic Murphy sign noted by sonographer. Common bile duct: Diameter: 2-3 mm, within normal limits. Liver: There are multiple round to ovoid hypoechoic lesions within the right hepatic lobe, measuring 2.4 x 2.9 x 1.8 cm, 1.5 x 2.1 x 1.4 cm, 2.5 x 1.6 x 2.3 cm and 1.5 x 1.3 x 1.6 cm. There is background increased hepatic parenchymal echogenicity. Portal vein is patent on color Doppler imaging with normal direction of blood flow towards the liver. Impression #1 will be called to the ordering clinician or  representative by the Radiologist Assistant, and communication documented in the PACS or [employer]. IMPRESSION: 1. Multiple round to ovoid hypoechoic lesions within the right hepatic lobe, measuring up to 2.4 x 2.9 x 1.8 cm. These lesions may reflect cysts and/or masses. A liver protocol abdominal MRI without and with contrast is recommended for further evaluation. 2. Background increased hepatic parenchymal echogenicity. This is a nonspecific finding, which may be seen in the setting of hepatic steatosis or other chronic hepatic parenchymal disease. 3. Otherwise unremarkable right upper quadrant ultrasound, as described. Electronically Signed   By: Jackey Loge D.O.   On: 05/09/2022 10:33    Anti-infectives: Anti-infectives (From admission, onward)    Start     Dose/Rate Route Frequency Ordered Stop   05/10/22 1400  cefTRIAXone (ROCEPHIN) 2 g in sodium chloride 0.9 % 100 mL IVPB        2 g 200 mL/hr over 30 Minutes Intravenous Every 24 hours 05/09/22 1511     05/09/22 1245  metroNIDAZOLE (FLAGYL) IVPB 500 mg        500 mg 100 mL/hr over 60 Minutes Intravenous Every 12 hours 05/09/22 1230     05/09/22 1245  cefTRIAXone (ROCEPHIN) 2 g in sodium chloride 0.9 % 100 mL IVPB        2 g 200 mL/hr over 30 Minutes Intravenous  Once 05/09/22 1230 05/09/22 1550        Assessment/Plan Hepatic Lesions concerning for Hepatic Abscesses  Fever  Leukocytosis  Elevated LFT's  Possible Acute Appendicitis  - MRCP with multiple liver lesions most indicative of hepatic abscesses. No travel outside of the country or recent travel. No IVDU. Hep and HIV panel neg. Unclear etiology. S/p IR aspiration  with purulent fluid obtained. Cx pending. Unclear etiology. ID following. Not sure if he needs further workup to determine etiology of this as he does not have typical risk factors but will defer to medicine. Continue abx.  - CT A/P had concerns for appendicitis. His hx and exam are not c/w this. He is  completely NT on exam and has had no abdominal pain. No indication for emergency surgery. He is already on abx to cover suspected hepatic abscesses which should also cover for this  - We will follow with you   FEN - Okay for CLD, IVF per [facility] VTE -SCDs, okay for chemical prophylaxis from a general surgery standpoint ID - Zosyn   - Per [facility] -  Hypokalemia  Hyperglycemia  ? L clavicle fx    LOS: 1 day    Jacinto Halim , [facility] Surgery 05/11/2022, 9:23 AM Please see Amion for pager number during day hours 7:00am-4:30pm

## 2022-05-12 ENCOUNTER — Inpatient Hospital Stay (HOSPITAL_COMMUNITY): Payer: BC Managed Care – PPO

## 2022-05-12 DIAGNOSIS — I5030 Unspecified diastolic (congestive) heart failure: Secondary | ICD-10-CM | POA: Diagnosis not present

## 2022-05-12 DIAGNOSIS — K769 Liver disease, unspecified: Secondary | ICD-10-CM | POA: Diagnosis not present

## 2022-05-12 DIAGNOSIS — K750 Abscess of liver: Secondary | ICD-10-CM

## 2022-05-12 LAB — CBC WITH DIFFERENTIAL/PLATELET
Abs Immature Granulocytes: 0.28 10*3/uL — ABNORMAL HIGH (ref 0.00–0.07)
Basophils Absolute: 0.1 10*3/uL (ref 0.0–0.1)
Basophils Relative: 0 %
Eosinophils Absolute: 0 10*3/uL (ref 0.0–0.5)
Eosinophils Relative: 0 %
HCT: 34 % — ABNORMAL LOW (ref 39.0–52.0)
Hemoglobin: 11.5 g/dL — ABNORMAL LOW (ref 13.0–17.0)
Immature Granulocytes: 2 %
Lymphocytes Relative: 4 %
Lymphs Abs: 0.7 10*3/uL (ref 0.7–4.0)
MCH: 30.3 pg (ref 26.0–34.0)
MCHC: 33.8 g/dL (ref 30.0–36.0)
MCV: 89.5 fL (ref 80.0–100.0)
Monocytes Absolute: 1.3 10*3/uL — ABNORMAL HIGH (ref 0.1–1.0)
Monocytes Relative: 8 %
Neutro Abs: 14.1 10*3/uL — ABNORMAL HIGH (ref 1.7–7.7)
Neutrophils Relative %: 86 %
Platelets: 121 10*3/uL — ABNORMAL LOW (ref 150–400)
RBC: 3.8 MIL/uL — ABNORMAL LOW (ref 4.22–5.81)
RDW: 13.3 % (ref 11.5–15.5)
WBC: 16.4 10*3/uL — ABNORMAL HIGH (ref 4.0–10.5)
nRBC: 0 % (ref 0.0–0.2)

## 2022-05-12 LAB — ECHOCARDIOGRAM COMPLETE
AR max vel: 3.25 cm2
AV Peak grad: 10.1 mmHg
Ao pk vel: 1.59 m/s
Area-P 1/2: 5.16 cm2
Height: 71 in
S' Lateral: 3.6 cm
Weight: 3386.27 oz

## 2022-05-12 LAB — PROCALCITONIN: Procalcitonin: 3.69 ng/mL

## 2022-05-12 LAB — COMPREHENSIVE METABOLIC PANEL
ALT: 112 U/L — ABNORMAL HIGH (ref 0–44)
AST: 39 U/L (ref 15–41)
Albumin: 2.3 g/dL — ABNORMAL LOW (ref 3.5–5.0)
Alkaline Phosphatase: 147 U/L — ABNORMAL HIGH (ref 38–126)
Anion gap: 8 (ref 5–15)
BUN: 8 mg/dL (ref 6–20)
CO2: 23 mmol/L (ref 22–32)
Calcium: 8 mg/dL — ABNORMAL LOW (ref 8.9–10.3)
Chloride: 101 mmol/L (ref 98–111)
Creatinine, Ser: 0.8 mg/dL (ref 0.61–1.24)
GFR, Estimated: >60 mL/min (ref >60)
Glucose, Bld: 128 mg/dL — ABNORMAL HIGH (ref 70–99)
Potassium: 3.2 mmol/L — ABNORMAL LOW (ref 3.5–5.1)
Sodium: 132 mmol/L — ABNORMAL LOW (ref 135–145)
Total Bilirubin: 1.5 mg/dL — ABNORMAL HIGH (ref 0.3–1.2)
Total Protein: 5.8 g/dL — ABNORMAL LOW (ref 6.5–8.1)

## 2022-05-12 LAB — MAGNESIUM: Magnesium: 1.9 mg/dL (ref 1.7–2.4)

## 2022-05-12 MED ORDER — ASPIRIN 81 MG PO CHEW
162.0000 mg | CHEWABLE_TABLET | Freq: Once | ORAL | Status: AC
  Administered 2022-05-12: 162 mg via ORAL
  Filled 2022-05-12: qty 2

## 2022-05-12 MED ORDER — POTASSIUM CHLORIDE CRYS ER 20 MEQ PO TBCR
40.0000 meq | EXTENDED_RELEASE_TABLET | Freq: Once | ORAL | Status: AC
  Administered 2022-05-12: 40 meq via ORAL
  Filled 2022-05-12: qty 2

## 2022-05-12 MED ORDER — SODIUM CHLORIDE 0.9 % IV SOLN
3.0000 g | Freq: Four times a day (QID) | INTRAVENOUS | Status: DC
  Administered 2022-05-12 – 2022-05-15 (×12): 3 g via INTRAVENOUS
  Filled 2022-05-12 (×12): qty 8

## 2022-05-12 MED ORDER — POTASSIUM CHLORIDE CRYS ER 20 MEQ PO TBCR
20.0000 meq | EXTENDED_RELEASE_TABLET | Freq: Once | ORAL | Status: AC
  Administered 2022-05-12: 20 meq via ORAL
  Filled 2022-05-12: qty 1

## 2022-05-12 MED ORDER — LACTATED RINGERS IV SOLN: INTRAVENOUS | Status: AC

## 2022-05-12 MED ORDER — ACETAMINOPHEN 500 MG PO TABS: 500.0000 mg | ORAL_TABLET | Freq: Once | ORAL | Status: DC

## 2022-05-12 NOTE — Progress Notes (Signed)
Patient ID: Samuel Patterson, male   DOB: 10-13-1971, 51 y.o.   MRN: [id number] [name] Surgery Progress Note     Subjective: CC-  Feels about the same as yesterday. Denies abdominal pain. Had some nausea and dry heaves. No appetite. Febrile up to 101.1 over night. WBC up 16.4 from 12.3  Objective: Vital signs in last 24 hours: Temp:  [98.3 F (36.8 C)-101.7 F (38.7 C)] 98.7 F (37.1 C) (06/19 0717) Pulse Rate:  [75-102] 82 (06/19 0717) Resp:  [16-20] 16 (06/19 0717) BP: (119-131)/(75-92) 128/77 (06/19 0717) SpO2:  [95 %-100 %] 100 % (06/19 0717) Last BM Date : 05/09/22  Intake/Output from previous day: 06/18 0701 - 06/19 0700 In: 1220.9 [P.O.:120; I.V.:733.7; IV Piggyback:367.2] Out: -  Intake/Output this shift: No intake/output data recorded.  PE: Gen:  Alert, NAD, pleasant Abd: Soft, ND, NT, +BS Psych: A&Ox3   Lab Results:  Recent Labs    05/11/22 0008 05/12/22 0432  WBC 12.3* 16.4*  HGB 11.2* 11.5*  HCT 32.9* 34.0*  PLT 125* 121*   BMET Recent Labs    05/11/22 0008 05/12/22 0432  NA 135 132*  K 3.5 3.2*  CL 107 101  CO2 23 23  GLUCOSE 119* 128*  BUN 8 8  CREATININE 0.78 0.80  CALCIUM 8.1* 8.0*   PT/INR Recent Labs    05/09/22 1636  LABPROT 13.9  INR 1.1   CMP     Component Value Date/Time   NA 132 (L) 05/12/2022 0432   K 3.2 (L) 05/12/2022 0432   CL 101 05/12/2022 0432   CO2 23 05/12/2022 0432   GLUCOSE 128 (H) 05/12/2022 0432   BUN 8 05/12/2022 0432   CREATININE 0.80 05/12/2022 0432   CALCIUM 8.0 (L) 05/12/2022 0432   PROT 5.8 (L) 05/12/2022 0432   ALBUMIN 2.3 (L) 05/12/2022 0432   AST 39 05/12/2022 0432   ALT 112 (H) 05/12/2022 0432   ALKPHOS 147 (H) 05/12/2022 0432   BILITOT 1.5 (H) 05/12/2022 0432   GFRNONAA >60 05/12/2022 0432   Lipase     Component Value Date/Time   LIPASE 23 05/09/2022 0556       Studies/Results: IR US Guide Bx Asp/Drain  Result Date: 05/11/2022 INDICATION: 51 year old male with  multifocal hepatic abscesses. EXAM: Ultrasound-guided hepatic abscess aspiration MEDICATIONS: The patient is currently admitted to the hospital and receiving intravenous antibiotics. The antibiotics were administered within an appropriate time frame prior to the initiation of the procedure. ANESTHESIA/SEDATION: Fentanyl 100 mcg IV; Versed 2 mg IV Moderate Sedation Time:  10 The patient was continuously monitored during the procedure by the interventional radiology nurse under my direct supervision. COMPLICATIONS: None immediate. PROCEDURE: Informed written consent was obtained from the patient after a thorough discussion of the procedural risks, benefits and alternatives. All questions were addressed. Maximal Sterile Barrier Technique was utilized including caps, mask, sterile gowns, sterile gloves, sterile drape, hand hygiene and skin antiseptic. A timeout was performed prior to the initiation of the procedure. Preprocedure ultrasound evaluation of the right upper quadrant demonstrated multifocal hypoechoic rounded lesions throughout the hepatic parenchyma, most measuring 1 cm or less. An approximately 1.9 cm collection in the right lateral liver was targeted for aspiration. Subdermal Local anesthesia was provided at the planned needle entry site with 1% lidocaine. Under direct ultrasound visualization, additional local anesthetic was administered about the hepatic capsule. Under direct ultrasound visualization, a 19 gauge, 10 cm Yueh needle was advanced to the central aspect of the fluid collection. Aspiration was  performed yielding approximately 5 mL of purulence fluid. The sample was sent to the lab for culture. The needle was removed. Postprocedure ultrasound demonstrated no evidence of perihepatic hematoma or other complicating features. A sterile bandage was applied. The patient was returned to the floor in stable condition. IMPRESSION: Technically successful ultrasound-guided aspiration of hepatic abscess in  the right lobe of the liver. Marliss Coots, MD [facility] Radiology Electronically Signed   By: Marliss Coots M.D.   On: 05/11/2022 00:31    Anti-infectives: Anti-infectives (From admission, onward)    Start     Dose/Rate Route Frequency Ordered Stop   05/10/22 1400  cefTRIAXone (ROCEPHIN) 2 g in sodium chloride 0.9 % 100 mL IVPB        2 g 200 mL/hr over 30 Minutes Intravenous Every 24 hours 05/09/22 1511     05/09/22 1245  metroNIDAZOLE (FLAGYL) IVPB 500 mg        500 mg 100 mL/hr over 60 Minutes Intravenous Every 12 hours 05/09/22 1230     05/09/22 1245  cefTRIAXone (ROCEPHIN) 2 g in sodium chloride 0.9 % 100 mL IVPB        2 g 200 mL/hr over 30 Minutes Intravenous  Once 05/09/22 1230 05/09/22 1550        Assessment/Plan Hepatic Lesions concerning for Hepatic Abscesses  Fever  Leukocytosis  Elevated LFT's  Possible Acute Appendicitis  - MRCP with multiple liver lesions most indicative of hepatic abscesses. No travel outside of the country or recent travel. No IVDU. Hep and HIV panel neg. Unclear etiology. ECHO scheduled for today, further work up per primary team - S/p IR aspiration with purulent fluid obtained. Cx with NGTD, report pending. With persistent fevers and worsening leukocytosis may need to consider escalating his antibiotics - primary team to discuss with ID - CT A/P had concerns for appendicitis. His hx and exam are not c/w this. He is completely NT on exam and has had no abdominal pain. No indication for emergency surgery. He is already on abx to cover suspected hepatic abscesses which should also cover for this  - We will follow with you   FEN - soft diet, IVF per [facility] VTE -SCDs, okay for chemical prophylaxis from a general surgery standpoint ID - rocephin/flagyl   - Per [facility] -  Hypokalemia  Hyperglycemia  ? L clavicle fx   I reviewed hospitalist notes, last 24 h vitals and pain scores, last 48 h intake and  output, last 24 h labs and trends, and last 24 h imaging results.    LOS: 2 days    Franne Forts, [facility] Surgery 05/12/2022, 9:03 AM Please see Amion for pager number during day hours 7:00am-4:30pm

## 2022-05-12 NOTE — Plan of Care (Signed)
  Problem: Clinical Measurements: Goal: Signs and symptoms of infection will decrease Outcome: Progressing   Problem: Respiratory: Goal: Ability to maintain adequate ventilation will improve Outcome: Progressing   Problem: Education: Goal: Knowledge of General Education information will improve Description: Including pain rating scale, medication(s)/side effects and non-pharmacologic comfort measures Outcome: Progressing   Problem: Nutrition: Goal: Adequate nutrition will be maintained Outcome: Progressing   Problem: Coping: Goal: Level of anxiety will decrease Outcome: Progressing   Problem: Pain Managment: Goal: General experience of comfort will improve Outcome: Progressing   Problem: Safety: Goal: Ability to remain free from injury will improve Outcome: Progressing   Problem: Skin Integrity: Goal: Risk for impaired skin integrity will decrease Outcome: Progressing

## 2022-05-12 NOTE — Progress Notes (Signed)
[facility]   Reason for visit: Follow up on pyogenic liver abscess  Interval History: Tmax 101.1, fever curve trending down overall.  WBC up to 16.4.  He felt poorly yesterday but a bit better today.  Eating.  No issues with rash or diarrhea. + headache with the fever.  Day 3 ceftriaxone + metronidazole  Physical Exam: Constitutional:  Vitals:   05/12/22 0629 05/12/22 0717  BP: 126/76 128/77  Pulse: 75 82  Resp: 17 16  Temp: 99.2 F (37.3 C) 98.7 F (37.1 C)  SpO2: 95% 100%   patient appears in NAD Respiratory: Normal respiratory effort  Review of Systems: Constitutional: positive for fevers, chills, and fatigue Neurological: positive for headaches  Lab Results  Component Value Date   WBC 16.4 (H) 05/12/2022   HGB 11.5 (L) 05/12/2022   HCT 34.0 (L) 05/12/2022   MCV 89.5 05/12/2022   PLT 121 (L) 05/12/2022    Lab Results  Component Value Date   CREATININE 0.80 05/12/2022   BUN 8 05/12/2022   NA 132 (L) 05/12/2022   K 3.2 (L) 05/12/2022   CL 101 05/12/2022   CO2 23 05/12/2022    Lab Results  Component Value Date   ALT 112 (H) 05/12/2022   AST 39 05/12/2022   ALKPHOS 147 (H) 05/12/2022     Microbiology: Recent Results (from the past 240 hour(s))  SARS Coronavirus 2 by RT PCR (hospital order, performed in [facility] hospital lab) *cepheid single result test* Anterior Nasal Swab     Status: None   Collection Time: 05/09/22  5:43 AM   Specimen: Anterior Nasal Swab  Result Value Ref Range Status   SARS Coronavirus 2 by RT PCR NEGATIVE NEGATIVE Final    Comment: (NOTE) SARS-CoV-2 target nucleic acids are NOT DETECTED.  The SARS-CoV-2 RNA is generally detectable in upper and lower respiratory specimens during the acute phase of infection. The lowest concentration of SARS-CoV-2 viral copies this assay can detect is 250 copies / mL. A negative result does not preclude SARS-CoV-2 infection and should not be used as the sole basis  for treatment or other patient management decisions.  A negative result may occur with improper specimen collection / handling, submission of specimen other than nasopharyngeal swab, presence of viral mutation(s) within the areas targeted by this assay, and inadequate number of viral copies (<250 copies / mL). A negative result must be combined with clinical observations, patient history, and epidemiological information.  Fact Sheet for Patients:   RoadLapTop.co.za  Fact Sheet for Healthcare Providers: http://kim-miller.com/  This test is not yet approved or  cleared by the [place] FDA and has been authorized for detection and/or diagnosis of SARS-CoV-2 by FDA under an Emergency Use Authorization (EUA).  This EUA will remain in effect (meaning this test can be used) for the duration of the COVID-19 declaration under Section 564(b)(1) of the Act, 21 U.S.C. section 360bbb-3(b)(1), unless the authorization is terminated or revoked sooner.  Performed at [facility], [address] [place], [place] [address]   Blood culture (routine x 2)     Status: None (Preliminary result)   Collection Time: 05/09/22  8:38 AM   Specimen: BLOOD RIGHT ARM  Result Value Ref Range Status   Specimen Description BLOOD RIGHT ARM  Final   Special Requests   Final    BOTTLES DRAWN AEROBIC AND ANAEROBIC Blood Culture results may not be optimal due to an inadequate volume of blood received  in culture bottles   Culture   Final    NO GROWTH 3 DAYS Performed at [facility], [address] [place], [place] [address]    Report Status PENDING  Incomplete  Blood culture (routine x 2)     Status: None (Preliminary result)   Collection Time: 05/09/22  8:43 AM   Specimen: BLOOD RIGHT HAND  Result Value Ref Range Status   Specimen Description BLOOD RIGHT HAND  Final   Special Requests   Final    BOTTLES DRAWN AEROBIC AND ANAEROBIC Blood Culture  adequate volume   Culture   Final    NO GROWTH 3 DAYS Performed at [facility], [address] [place], [place] [address]    Report Status PENDING  Incomplete  MRSA Next Gen by PCR, Nasal     Status: None   Collection Time: 05/10/22  6:31 AM   Specimen: Nasal Mucosa; Nasal Swab  Result Value Ref Range Status   MRSA by PCR Next Gen NOT DETECTED NOT DETECTED Final    Comment: (NOTE) The GeneXpert MRSA Assay (FDA approved for NASAL specimens only), is one component of a comprehensive MRSA colonization surveillance program. It is not intended to diagnose MRSA infection nor to guide or monitor treatment for MRSA infections. Test performance is not FDA approved in patients less than 62 years old. Performed at [facility], [address] [place], [place] [address]   Aerobic/Anaerobic Culture w Gram Stain (surgical/deep wound)     Status: None (Preliminary result)   Collection Time: 05/10/22  5:33 PM   Specimen: Liver; Abscess  Result Value Ref Range Status   Specimen Description LIVER  Final   Special Requests ABSCESS  Final   Gram Stain   Final    ABUNDANT WBC PRESENT,BOTH PMN AND MONONUCLEAR NO ORGANISMS SEEN    Culture   Final    NO GROWTH < 24 HOURS Performed at [facility], [address], [place], [place] [address]    Report Status PENDING  Incomplete    Impression/Plan:  1. Pyogenic liver abscess - s/p aspiration with just 5cc of fluid and no growth to date.  Multiple lesions so no drain able to be left in place.  Tolerating antibiotics.  At this point, I will change him to ampicillin/sulbactam to be sure he tolerates this and then transition to amoxicillin/clavulanate at discharge for 3-4 weeks with follow up CT scan at that time.    2.  Fever - he continues to have a fever which is not unexpected with his underlying abscess with minimal drainage able to be done.  It seems to be trending down and can continue with antipyretics.  No indication for  additional antibiotics at this time.    3.  Possible acute appendicitis - noted on the CT scan but clinically no c/w CT findings.  Followed by surgery.

## 2022-05-12 NOTE — Progress Notes (Signed)
PROGRESS NOTE                                                                                                                                                                                                             Patient Demographics:    Samuel Patterson, is a 51 y.o. male, DOB - [birth year], XVQ:[id number]  Outpatient Primary MD for the patient is Rudd, Bertram Millard, MD    LOS - 2  Admit date - 05/09/2022    Chief Complaint  Patient presents with   Fever   Vomiting       Brief Narrative (HPI from H&P)    51 y.o. male with history h/o GERD, diverticulosis, hypertriglyceridemia presents with persistent fevers over the last few days. Was evaluated by PCP on 6/13, had fever 102.6 F and felt to have viral illness possible, advised to take OTC antipyretics and encourage fluid intake.  Patient reports continued poor oral intake, high fevers with Tmax of 103F yesterday at home along with nausea, vomiting x1 this morning prompting ED visit, further work-up suggest liver abscess.   Subjective:   Patient in bed, appears comfortable, denies any headache, no fever, no chest pain or pressure, no shortness of breath , no abdominal pain. No new focal weakness.   Assessment  & Plan :    Sepsis due to liver abscess. No clear source, no recent travel, no history of GI issues or gallbladder issues in the past, remarkably pain-free and nontender.  GI, general surgery and ID called.  Seen by IR and underwent right lobe abscess aspiration with 5 cc of fluid removed on 05/10/2022.  Continue IV antibiotics most likely will require a PICC line with prolonged IV antibiotics well will defer this to ID.  Although he is still running fevers on 05/12/2022 he appears nontoxic and overall feels okay, discussed with general surgery and ID both on 05/12/2022, continue to follow on antibiotics.  Supportive care.  Cultures thus far negative.  We will also get an  echocardiogram to look for source of infection, further management per ID and general surgery.  2.  Transaminitis.  Due to #1 above.  3.  Radiological question of acute appendicitis on CT scan.  Clinically ruled out, also cleared by general surgery.  4.  GERD.  PPI  5.  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  6.  Hypertriglyceridemia.  On fish oil.   7.  Hypokalemia.  Replaced.       Condition - Extremely Guarded  Family Communication  :  wife bedside 05/10/22, 05/12/2022  Code Status :  Full  Consults  :  GI, CCS, IR, ID  PUD Prophylaxis : PPI   Procedures  :     Echocardiogram.    By IR on 05/10/2022.  -  Ultrasound guided hepatic fluid collection aspiration, 5 cc fluid removed from right lobe abscess.  MRI - 1. Multiple liver lesions scattered throughout the hepatic parenchyma with imaging characteristics most indicative of hepatic abscesses. Several of these have some large perfusion anomalies associated with them. Repeat abdominal MRI is recommended in 3 months to ensure regression of these findings following therapy.      Disposition Plan  :    Status is: Observation   DVT Prophylaxis  :    SCDs Start: 05/09/22 1514   Lab Results  Component Value Date   PLT 121 (L) 05/12/2022    Diet :  Diet Order             DIET SOFT Room service appropriate? Yes; Fluid consistency: Thin  Diet effective now                    Inpatient Medications  Scheduled Meds:  fluticasone  2 spray Each Nare Daily   methylphenidate  10 mg Oral BID   omega-3 acid ethyl esters  1 g Oral Daily   pantoprazole  40 mg Oral Daily   potassium chloride  20 mEq Oral Once   Continuous Infusions:  cefTRIAXone (ROCEPHIN)  IV Stopped (05/11/22 1448)   lactated ringers 75 mL/hr at 05/12/22 0808   metronidazole 500 mg (05/12/22 0101)   promethazine (PHENERGAN) injection (IM or IVPB)     PRN Meds:.acetaminophen, albuterol, fentaNYL, ibuprofen, midazolam, ondansetron (ZOFRAN) IV, promethazine  (PHENERGAN) injection (IM or IVPB), traMADol  Antibiotics  :    Anti-infectives (From admission, onward)    Start     Dose/Rate Route Frequency Ordered Stop   05/10/22 1400  cefTRIAXone (ROCEPHIN) 2 g in sodium chloride 0.9 % 100 mL IVPB        2 g 200 mL/hr over 30 Minutes Intravenous Every 24 hours 05/09/22 1511     05/09/22 1245  metroNIDAZOLE (FLAGYL) IVPB 500 mg        500 mg 100 mL/hr over 60 Minutes Intravenous Every 12 hours 05/09/22 1230     05/09/22 1245  cefTRIAXone (ROCEPHIN) 2 g in sodium chloride 0.9 % 100 mL IVPB        2 g 200 mL/hr over 30 Minutes Intravenous  Once 05/09/22 1230 05/09/22 1550        Time Spent in minutes  30   Susa Raring M.D on 05/12/2022 at 9:47 AM  To page go to [email or web address]   [facility] -  Office  [phone]  See all Orders from today for further details    Objective:   Vitals:   05/12/22 0026 05/12/22 0245 05/12/22 0629 05/12/22 0717  BP: 126/81  126/76 128/77  Pulse: 95  75 82  Resp: 20  17 16   Temp: 98.6 F (37 C) (!) 101.1 F (38.4 C) 99.2 F (37.3 C) 98.7 F (37.1 C)  TempSrc: Oral Oral Oral Oral  SpO2: 95%  95% 100%  Weight:      Height:        Wt Readings from Last 3 Encounters:  05/10/22 96 kg  05/06/22 96 kg  01/30/22 100.2 kg     Intake/Output Summary (Last 24 hours) at 05/12/2022 0947 Last data filed at 05/12/2022 0201 Gross per 24 hour  Intake 1220.9 ml  Output --  Net 1220.9 ml     Physical Exam  Awake Alert, No new F.N deficits, Normal affect [place].AT,PERRAL Supple Neck, No JVD,   Symmetrical Chest wall movement, Good air movement bilaterally, CTAB RRR,No Gallops, Rubs or new Murmurs,  +ve B.Sounds, Abd Soft, No tenderness,   No Cyanosis, Clubbing or edema     Data Review:    CBC Recent Labs  Lab 05/09/22 0556 05/10/22 0033 05/11/22 0008 05/12/22 0432  WBC 14.3* 9.1 12.3* 16.4*  HGB 13.3 11.7* 11.2* 11.5*  HCT 38.9* 35.5* 32.9* 34.0*  PLT 159 110* 125* 121*  MCV 90.3  90.8 89.2 89.5  MCH 30.9 29.9 30.4 30.3  MCHC 34.2 33.0 34.0 33.8  RDW 12.8 13.2 13.2 13.3  LYMPHSABS  --   --  0.8 0.7  MONOABS  --   --  1.2* 1.3*  EOSABS  --   --  0.0 0.0  BASOSABS  --   --  0.0 0.1    Electrolytes Recent Labs  Lab 05/09/22 0556 05/09/22 1609 05/09/22 1636 05/10/22 0033 05/11/22 0008 05/12/22 0432  NA 135  --   --  133* 135 132*  K 3.2*  --   --  3.2* 3.5 3.2*  CL 99  --   --  102 107 101  CO2 22  --   --  22 23 23   GLUCOSE 171*  --   --  131* 119* 128*  BUN 10  --   --  8 8 8   CREATININE 1.04  --   --  0.98 0.78 0.80  CALCIUM 8.4*  --   --  8.0* 8.1* 8.0*  AST 101*  --   --  44* 46* 39  ALT 308*  --   --  191* 148* 112*  ALKPHOS 242*  --   --  175* 147* 147*  BILITOT 3.5*  --   --  1.8* 1.0 1.5*  ALBUMIN 2.9*  --   --  2.7* 2.3* 2.3*  MG  --   --   --   --  1.9 1.9  PROCALCITON  --   --  6.01 4.68 3.95 3.69  LATICACIDVEN  --  1.3  --   --   --   --   INR  --   --  1.1  --   --   --       Radiology Reports IR Guide Bx Asp/Drain  Result Date: 05/11/2022 INDICATION: 51 year old male with multifocal hepatic abscesses. EXAM: Ultrasound-guided hepatic abscess aspiration MEDICATIONS: The patient is currently admitted to the hospital and receiving intravenous antibiotics. The antibiotics were administered within an appropriate time frame prior to the initiation of the procedure. ANESTHESIA/SEDATION: Fentanyl 100 mcg IV; Versed 2 mg IV Moderate Sedation Time:  10 The patient was continuously monitored during the procedure by the interventional radiology nurse under my direct supervision. COMPLICATIONS: None immediate. PROCEDURE: Informed written consent was obtained from the patient after a thorough discussion of the procedural risks, benefits and alternatives. All questions were addressed. Maximal Sterile Barrier Technique was utilized including caps, mask, sterile gowns, sterile gloves, sterile drape, hand hygiene and skin antiseptic. A timeout was  performed prior to the initiation of the procedure. Preprocedure ultrasound evaluation of the right upper  quadrant demonstrated multifocal hypoechoic rounded lesions throughout the hepatic parenchyma, most measuring 1 cm or less. An approximately 1.9 cm collection in the right lateral liver was targeted for aspiration. Subdermal Local anesthesia was provided at the planned needle entry site with 1% lidocaine. Under direct ultrasound visualization, additional local anesthetic was administered about the hepatic capsule. Under direct ultrasound visualization, a 19 gauge, 10 cm Yueh needle was advanced to the central aspect of the fluid collection. Aspiration was performed yielding approximately 5 mL of purulence fluid. The sample was sent to the lab for culture. The needle was removed. Postprocedure ultrasound demonstrated no evidence of perihepatic hematoma or other complicating features. A sterile bandage was applied. The patient was returned to the floor in stable condition. IMPRESSION: Technically successful ultrasound-guided aspiration of hepatic abscess in the right lobe of the liver. Marliss Cootsylan Suttle, MD [facility] Electronically Signed   By: Marliss Cootsylan  Suttle M.D.   On: 05/11/2022 00:31   MR ABDOMEN W WO CONTRAST  Result Date: 05/10/2022 CLINICAL DATA:  51 year old male with history of fever, nausea and vomiting. Multiple indeterminate liver lesions noted on CT examination. Follow-up study. EXAM: MRI ABDOMEN WITHOUT AND WITH CONTRAST TECHNIQUE: Multiplanar multisequence MR imaging of the abdomen was performed both before and after the administration of intravenous contrast. CONTRAST:  10mL GADAVIST GADOBUTROL 1 MMOL/ML IV SOLN COMPARISON:  No prior abdominal MRI. CT the abdomen and pelvis 05/09/2022. FINDINGS: Lower chest: Unremarkable. Hepatobiliary: Scattered throughout the liver there are multiple T1 hypointense, T2 hyperintense lesions which are  centrally hypovascular but demonstrate peripheral rim enhancement and several demonstrate surrounding perfusion anomalies. These lesions all restrict diffusion. The largest of these lesions is in segment 6 (axial image 32 of series 4 and coronal image 10 of series 3) measuring 3.9 x 2.5 x 3.0 cm. No intrahepatic biliary ductal dilatation. Gallbladder is unremarkable in appearance. Pancreas: No pancreatic mass. No pancreatic ductal dilatation. No pancreatic or peripancreatic fluid collections or inflammatory changes. Spleen:  Unremarkable. Adrenals/Urinary Tract: Bilateral kidneys and bilateral adrenal glands are normal in appearance. No hydroureteronephrosis in the visualized portions of the abdomen. Stomach/Bowel: Visualized portions are unremarkable. Vascular/Lymphatic: No aneurysm identified in the visualized abdominal vasculature. No lymphadenopathy noted in the abdomen. Other: No significant volume of ascites noted in the visualized portions of the peritoneal cavity. Musculoskeletal: No aggressive appearing osseous lesions are noted in the visualized portions of the skeleton. IMPRESSION: 1. Multiple liver lesions scattered throughout the hepatic parenchyma with imaging characteristics most indicative of hepatic abscesses. Several of these have some large perfusion anomalies associated with them. Repeat abdominal MRI is recommended in 3 months to ensure regression of these findings following therapy. Electronically Signed   By: Trudie Reedaniel  Entrikin M.D.   On: 05/10/2022 06:52   CT ABDOMEN PELVIS W CONTRAST  Result Date: 05/09/2022 CLINICAL DATA:  Nausea and vomiting.  Fever. EXAM: CT ABDOMEN AND PELVIS WITH CONTRAST TECHNIQUE: Multidetector CT imaging of the abdomen and pelvis was performed using the standard protocol following bolus administration of intravenous contrast. RADIATION DOSE REDUCTION: This exam was performed according to the departmental dose-optimization program which includes automated exposure  control, adjustment of the mA and/or kV according to patient size and/or use of iterative reconstruction technique. CONTRAST:  100mL OMNIPAQUE IOHEXOL 300 MG/ML  SOLN COMPARISON:  Abdominal ultrasound 05/09/2022 FINDINGS: Lower chest: Dependent subsegmental atelectasis in both lower lobes. Hepatobiliary: Suspected hepatic steatosis. Scattered hypodense renal lesions are present, these have greater than fluid density and some have marginal  irregularity or nodularity such as the hypodense lesion on image 21 of series 3 which measures 2.4 by 1.5 cm, and the hypodense lesion on image 32 of series 3 which measures 3.9 by 2.5 cm. Accordingly these lesions are nonspecific, hepatic protocol MRI with and without contrast is once again recommended for definitive characterization. The possibility of hepatic abscesses is not excluded in this patient with fever although the seem to have less marginal enhancement than I would expect for abscesses. No biliary dilatation. Pancreas: Unremarkable Spleen: Unremarkable Adrenals/Urinary Tract: Unremarkable Stomach/Bowel: The appendix is poorly seen but appears to extend from the cecum on images 49 through 57 of series 7, this structure has a diameter of 1.0 cm reason the possibility of acute appendicitis. The distal margin of the structures obscured by bowel loops. Vascular/Lymphatic: Unremarkable Reproductive: Unremarkable Other: No supplemental non-categorized findings. Musculoskeletal: Likely physiologic anterior wedging at T11. IMPRESSION: 1. Multiple complex hepatic lesions, measuring higher than fluid density, some with somewhat irregular or nodular margins. Hepatic abscesses are a possibility in this patient with fever. Other types of mass cannot be excluded. Metastatic disease to the liver is not completely excluded. Recommend hepatic protocol MRI with and without contrast. 2. A retrocecal tubular structure thought to probably represent appendix is thickened at 1.0 cm in  diameter. The distal margin of this structure is obscured by adjacent loops of bowel. The appearance raises suspicion for potential acute appendicitis. Correlate with right lower quadrant tenderness or other characteristic physical exam findings. 3. Hepatic steatosis. Electronically Signed   By: Gaylyn Rong M.D.   On: 05/09/2022 11:57   US Abdomen Limited RUQ (LIVER/GB)  Result Date: 05/09/2022 CLINICAL DATA:  Provided history: Pain. Jaundice. EXAM: ULTRASOUND ABDOMEN LIMITED RIGHT UPPER QUADRANT COMPARISON:  No pertinent prior exams available for comparison. FINDINGS: Gallbladder: No gallstones or wall thickening visualized. No sonographic Murphy sign noted by sonographer. Common bile duct: Diameter: 2-3 mm, within normal limits. Liver: There are multiple round to ovoid hypoechoic lesions within the right hepatic lobe, measuring 2.4 x 2.9 x 1.8 cm, 1.5 x 2.1 x 1.4 cm, 2.5 x 1.6 x 2.3 cm and 1.5 x 1.3 x 1.6 cm. There is background increased hepatic parenchymal echogenicity. Portal vein is patent on color Doppler imaging with normal direction of blood flow towards the liver. Impression #1 will be called to the ordering clinician or representative by the Radiologist Assistant, and communication documented in the PACS or [employer]. IMPRESSION: 1. Multiple round to ovoid hypoechoic lesions within the right hepatic lobe, measuring up to 2.4 x 2.9 x 1.8 cm. These lesions may reflect cysts and/or masses. A liver protocol abdominal MRI without and with contrast is recommended for further evaluation. 2. Background increased hepatic parenchymal echogenicity. This is a nonspecific finding, which may be seen in the setting of hepatic steatosis or other chronic hepatic parenchymal disease. 3. Otherwise unremarkable right upper quadrant ultrasound, as described. Electronically Signed   By: Jackey Loge D.O.   On: 05/09/2022 10:33   DG Chest 2 View  Result Date: 05/09/2022 CLINICAL DATA:  A 51 year old male  presents with fever. EXAM: CHEST - 2 VIEW COMPARISON:  None Available. FINDINGS: Trachea midline. Cardiomediastinal contours and hilar structures are normal. No sign of pleural effusion. On limited assessment no acute skeletal findings. There is a fracture of the distal third of the LEFT clavicle which may be subacute or chronic. IMPRESSION: 1. No acute cardiopulmonary disease. 2. Fracture of the distal third of the LEFT clavicle which may be  subacute or chronic. Correlate with any pain in this area with patient history. Electronically Signed   By: Donzetta Kohut M.D.   On: 05/09/2022 09:23

## 2022-05-13 ENCOUNTER — Other Ambulatory Visit (HOSPITAL_COMMUNITY): Payer: Self-pay

## 2022-05-13 DIAGNOSIS — K769 Liver disease, unspecified: Secondary | ICD-10-CM | POA: Diagnosis not present

## 2022-05-13 LAB — CBC WITH DIFFERENTIAL/PLATELET
Abs Immature Granulocytes: 0.57 10*3/uL — ABNORMAL HIGH (ref 0.00–0.07)
Basophils Absolute: 0.1 10*3/uL (ref 0.0–0.1)
Basophils Relative: 0 %
Eosinophils Absolute: 0.1 10*3/uL (ref 0.0–0.5)
Eosinophils Relative: 0 %
HCT: 36.3 % — ABNORMAL LOW (ref 39.0–52.0)
Hemoglobin: 12.2 g/dL — ABNORMAL LOW (ref 13.0–17.0)
Immature Granulocytes: 4 %
Lymphocytes Relative: 9 %
Lymphs Abs: 1.2 10*3/uL (ref 0.7–4.0)
MCH: 29.8 pg (ref 26.0–34.0)
MCHC: 33.6 g/dL (ref 30.0–36.0)
MCV: 88.8 fL (ref 80.0–100.0)
Monocytes Absolute: 1.2 10*3/uL — ABNORMAL HIGH (ref 0.1–1.0)
Monocytes Relative: 9 %
Neutro Abs: 11 10*3/uL — ABNORMAL HIGH (ref 1.7–7.7)
Neutrophils Relative %: 78 %
Platelets: 155 10*3/uL (ref 150–400)
RBC: 4.09 MIL/uL — ABNORMAL LOW (ref 4.22–5.81)
RDW: 13.5 % (ref 11.5–15.5)
WBC: 14.1 10*3/uL — ABNORMAL HIGH (ref 4.0–10.5)
nRBC: 0 % (ref 0.0–0.2)

## 2022-05-13 LAB — COMPREHENSIVE METABOLIC PANEL
ALT: 83 U/L — ABNORMAL HIGH (ref 0–44)
AST: 29 U/L (ref 15–41)
Albumin: 2.3 g/dL — ABNORMAL LOW (ref 3.5–5.0)
Alkaline Phosphatase: 134 U/L — ABNORMAL HIGH (ref 38–126)
Anion gap: 12 (ref 5–15)
BUN: 7 mg/dL (ref 6–20)
CO2: 25 mmol/L (ref 22–32)
Calcium: 8.3 mg/dL — ABNORMAL LOW (ref 8.9–10.3)
Chloride: 101 mmol/L (ref 98–111)
Creatinine, Ser: 0.75 mg/dL (ref 0.61–1.24)
GFR, Estimated: >60 mL/min (ref >60)
Glucose, Bld: 113 mg/dL — ABNORMAL HIGH (ref 70–99)
Potassium: 3.9 mmol/L (ref 3.5–5.1)
Sodium: 138 mmol/L (ref 135–145)
Total Bilirubin: 1 mg/dL (ref 0.3–1.2)
Total Protein: 6.2 g/dL — ABNORMAL LOW (ref 6.5–8.1)

## 2022-05-13 LAB — PROCALCITONIN: Procalcitonin: 2.15 ng/mL

## 2022-05-13 LAB — MAGNESIUM: Magnesium: 2.1 mg/dL (ref 1.7–2.4)

## 2022-05-13 MED ORDER — AMOXICILLIN-POT CLAVULANATE 875-125 MG PO TABS
1.0000 | ORAL_TABLET | Freq: Two times a day (BID) | ORAL | 0 refills | Status: DC
Start: 2022-05-13 — End: 2022-06-03
  Filled 2022-05-13: qty 42, 21d supply, fill #0

## 2022-05-13 NOTE — Progress Notes (Signed)
PROGRESS NOTE                                                                                                                                                                                                             Patient Demographics:    Samuel Patterson, is a 51 y.o. male, DOB - 16-Dec-1970, WUJ:[id number]  Outpatient Primary MD for the patient is Rudd, Bertram Millard, MD    LOS - 3  Admit date - 05/09/2022    Chief Complaint  Patient presents with   Fever   Vomiting       Brief Narrative (HPI from H&P)    51 y.o. male with history h/o GERD, diverticulosis, hypertriglyceridemia presents with persistent fevers over the last few days. Was evaluated by PCP on 6/13, had fever 102.6 F and felt to have viral illness possible, advised to take OTC antipyretics and encourage fluid intake.  Patient reports continued poor oral intake, high fevers with Tmax of 103F yesterday at home along with nausea, vomiting x1 this morning prompting ED visit, further work-up suggest liver abscess.   Subjective:   Patient in bed, appears comfortable, denies any headache, improving fever, no chest pain or pressure, no shortness of breath , no abdominal pain. No new focal weakness.   Assessment  & Plan :    Sepsis due to liver abscess. No clear source, no recent travel, no history of GI issues or gallbladder issues in the past, remarkably pain-free and nontender.  GI, general surgery and ID called.  Seen by IR and underwent right lobe abscess aspiration with 5 cc of fluid removed on 05/10/2022.  Continue IV antibiotics most likely will require a PICC line with prolonged IV antibiotics well will defer this to ID.  Although he is still running fevers on 6/202023 he appears nontoxic and overall feels okay, discussed with general surgery and ID both on 05/12/2022, continue to follow on IV antibiotics here followed by 4 weeks of PO Augmentin upon DC. Cultures thus  far negative, stable echocardiogram, further management per ID and general surgery.  2.  Transaminitis.  Due to #1 above.  3.  Radiological question of acute appendicitis on CT scan.  Clinically ruled out, also cleared by general surgery.  4.  GERD.  PPI  5.  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  6.  Hypertriglyceridemia.  On  fish oil.   7.  Hypokalemia.  Replaced.       Condition - Extremely Guarded  Family Communication  :  wife bedside 05/10/22, 05/12/2022  Code Status :  Full  Consults  :  GI, CCS, IR, ID  PUD Prophylaxis : PPI   Procedures  :     Echocardiogram.  1. Left ventricular ejection fraction, by estimation, is 60 to 65%. The left ventricle has normal function. The left ventricle has no regional wall motion abnormalities. Left ventricular diastolic parameters are consistent with Grade I diastolic dysfunction (impaired relaxation).  2. Right ventricular systolic function is normal. The right ventricular size is normal.  3. Left atrial size was mildly dilated.  4. The mitral valve is normal in structure. Trivial mitral valve regurgitation. No evidence of mitral stenosis.  5. The aortic valve is normal in structure. Aortic valve regurgitation is not visualized. No aortic stenosis is present.  6. The inferior vena cava is normal in size with greater than 50% respiratory variability, suggesting right atrial pressure of 3 mmHg.  By IR on 05/10/2022.  -  Ultrasound guided hepatic fluid collection aspiration, 5 cc fluid removed from right lobe abscess.  MRI - 1. Multiple liver lesions scattered throughout the hepatic parenchyma with imaging characteristics most indicative of hepatic abscesses. Several of these have some large perfusion anomalies associated with them. Repeat abdominal MRI is recommended in 3 months to ensure regression of these findings following therapy.      Disposition Plan  :    Status is: Observation   DVT Prophylaxis  :    SCDs Start: 05/09/22 1514   Lab  Results  Component Value Date   PLT 155 05/13/2022    Diet :  Diet Order             DIET SOFT Room service appropriate? Yes; Fluid consistency: Thin  Diet effective now                    Inpatient Medications  Scheduled Meds:  acetaminophen  500 mg Oral Once   fluticasone  2 spray Each Nare Daily   methylphenidate  10 mg Oral BID   omega-3 acid ethyl esters  1 g Oral Daily   pantoprazole  40 mg Oral Daily   Continuous Infusions:  ampicillin-sulbactam (UNASYN) IV 3 g (05/13/22 0505)   lactated ringers 75 mL/hr at 05/12/22 2146   promethazine (PHENERGAN) injection (IM or IVPB)     PRN Meds:.acetaminophen, albuterol, fentaNYL, ibuprofen, midazolam, ondansetron (ZOFRAN) IV, promethazine (PHENERGAN) injection (IM or IVPB), traMADol    Time Spent in minutes  30   Susa Raring M.D on 05/13/2022 at 8:24 AM  To page go to [email or web address]   [facility] -  Office  [phone]  See all Orders from today for further details    Objective:   Vitals:   05/12/22 1557 05/12/22 1718 05/13/22 0420 05/13/22 0813  BP: 122/82  119/79 126/72  Pulse: 99  81 91  Resp: 16  18 17   Temp: (!) 101.2 F (38.4 C) 98.6 F (37 C) 99.7 F (37.6 C) 98.3 F (36.8 C)  TempSrc: Oral Oral Oral Oral  SpO2: 98%  97% 97%  Weight:      Height:        Wt Readings from Last 3 Encounters:  05/10/22 96 kg  05/06/22 96 kg  01/30/22 100.2 kg    No intake or output data in the 24 hours ending 05/13/22 05/15/22  Physical Exam  Awake Alert, No new F.N deficits, Normal affect [place].AT,PERRAL Supple Neck, No JVD,   Symmetrical Chest wall movement, Good air movement bilaterally, CTAB RRR,No Gallops, Rubs or new Murmurs,  +ve B.Sounds, Abd Soft, No tenderness,   No Cyanosis, Clubbing or edema    Data Review:    CBC Recent Labs  Lab 05/09/22 0556 05/10/22 0033 05/11/22 0008 05/12/22 0432 05/13/22 0435  WBC 14.3* 9.1 12.3* 16.4* 14.1*  HGB 13.3 11.7* 11.2* 11.5* 12.2*  HCT  38.9* 35.5* 32.9* 34.0* 36.3*  PLT 159 110* 125* 121* 155  MCV 90.3 90.8 89.2 89.5 88.8  MCH 30.9 29.9 30.4 30.3 29.8  MCHC 34.2 33.0 34.0 33.8 33.6  RDW 12.8 13.2 13.2 13.3 13.5  LYMPHSABS  --   --  0.8 0.7 1.2  MONOABS  --   --  1.2* 1.3* 1.2*  EOSABS  --   --  0.0 0.0 0.1  BASOSABS  --   --  0.0 0.1 0.1    Electrolytes Recent Labs  Lab 05/09/22 0556 05/09/22 1609 05/09/22 1636 05/10/22 0033 05/11/22 0008 05/12/22 0432 05/13/22 0435  NA 135  --   --  133* 135 132* 138  K 3.2*  --   --  3.2* 3.5 3.2* 3.9  CL 99  --   --  102 107 101 101  CO2 22  --   --  22 23 23 25   GLUCOSE 171*  --   --  131* 119* 128* 113*  BUN 10  --   --  8 8 8 7   CREATININE 1.04  --   --  0.98 0.78 0.80 0.75  CALCIUM 8.4*  --   --  8.0* 8.1* 8.0* 8.3*  AST 101*  --   --  44* 46* 39 29  ALT 308*  --   --  191* 148* 112* 83*  ALKPHOS 242*  --   --  175* 147* 147* 134*  BILITOT 3.5*  --   --  1.8* 1.0 1.5* 1.0  ALBUMIN 2.9*  --   --  2.7* 2.3* 2.3* 2.3*  MG  --   --   --   --  1.9 1.9 2.1  PROCALCITON  --   --  6.01 4.68 3.95 3.69 2.15  LATICACIDVEN  --  1.3  --   --   --   --   --   INR  --   --  1.1  --   --   --   --       Radiology Reports ECHOCARDIOGRAM COMPLETE  Result Date: 05/12/2022    ECHOCARDIOGRAM REPORT   Patient Name:   Samuel Patterson Date of Exam: 05/12/2022 Medical Rec #:  [id number]       Height:       71.0 in Accession #:    [id number]      Weight:       211.6 lb Date of Birth:  09/05/1971       BSA:          2.160 m Patient Age:    50 years        BP:           128/77 mmHg Patient Gender: M               HR:           114 bpm. Exam Location:  Inpatient Procedure: 2D Echo, Cardiac Doppler and Color Doppler Indications:  CHF  History:        Patient has no prior history of Echocardiogram examinations.  Sonographer:    Eduard Roux Referring Phys: Effie Shy Marshelle Bilger K [facility] IMPRESSIONS  1. Left ventricular ejection fraction, by estimation, is 60 to 65%. The left ventricle has normal  function. The left ventricle has no regional wall motion abnormalities. Left ventricular diastolic parameters are consistent with Grade I diastolic dysfunction (impaired relaxation).  2. Right ventricular systolic function is normal. The right ventricular size is normal.  3. Left atrial size was mildly dilated.  4. The mitral valve is normal in structure. Trivial mitral valve regurgitation. No evidence of mitral stenosis.  5. The aortic valve is normal in structure. Aortic valve regurgitation is not visualized. No aortic stenosis is present.  6. The inferior vena cava is normal in size with greater than 50% respiratory variability, suggesting right atrial pressure of 3 mmHg. FINDINGS  Left Ventricle: Left ventricular ejection fraction, by estimation, is 60 to 65%. The left ventricle has normal function. The left ventricle has no regional wall motion abnormalities. The left ventricular internal cavity size was normal in size. There is  no left ventricular hypertrophy. Left ventricular diastolic parameters are consistent with Grade I diastolic dysfunction (impaired relaxation). Right Ventricle: The right ventricular size is normal. No increase in right ventricular wall thickness. Right ventricular systolic function is normal. Left Atrium: Left atrial size was mildly dilated. Right Atrium: Right atrial size was normal in size. Pericardium: There is no evidence of pericardial effusion. Mitral Valve: The mitral valve is normal in structure. Trivial mitral valve regurgitation. No evidence of mitral valve stenosis. Tricuspid Valve: The tricuspid valve is normal in structure. Tricuspid valve regurgitation is trivial. No evidence of tricuspid stenosis. Aortic Valve: The aortic valve is normal in structure. Aortic valve regurgitation is not visualized. No aortic stenosis is present. Aortic valve peak gradient measures 10.1 mmHg. Pulmonic Valve: The pulmonic valve was normal in structure. Pulmonic valve regurgitation is not  visualized. No evidence of pulmonic stenosis. Aorta: The aortic root is normal in size and structure. Venous: The inferior vena cava is normal in size with greater than 50% respiratory variability, suggesting right atrial pressure of 3 mmHg. IAS/Shunts: No atrial level shunt detected by color flow Doppler.  LEFT VENTRICLE PLAX 2D LVIDd:         5.30 cm   Diastology LVIDs:         3.60 cm   LV e' medial:    5.44 cm/s LV PW:         1.00 cm   LV E/e' medial:  13.4 LV IVS:        1.00 cm   LV e' lateral:   10.70 cm/s LVOT diam:     2.20 cm   LV E/e' lateral: 6.8 LV SV:         84 LV SV Index:   39 LVOT Area:     3.80 cm  RIGHT VENTRICLE          IVC RV Basal diam:  3.00 cm  IVC diam: 1.60 cm TAPSE (M-mode): 2.6 cm LEFT ATRIUM             Index        RIGHT ATRIUM           Index LA diam:        3.70 cm 1.71 cm/m   RA Area:     13.10 cm LA Vol (A2C):   50.0 ml  23.15 ml/m  RA Volume:   30.80 ml  14.26 ml/m LA Vol (A4C):   52.2 ml 24.17 ml/m LA Biplane Vol: 51.2 ml 23.71 ml/m  AORTIC VALVE                 PULMONIC VALVE AV Area (Vmax): 3.25 cm     PV Vmax:       1.04 m/s AV Vmax:        159.00 cm/s  PV Peak grad:  4.3 mmHg AV Peak Grad:   10.1 mmHg LVOT Vmax:      136.00 cm/s LVOT Vmean:     87.200 cm/s LVOT VTI:       0.221 m  AORTA Ao Root diam: 3.30 cm Ao Asc diam:  3.20 cm MITRAL VALVE MV Area (PHT): 5.16 cm     SHUNTS MV Decel Time: 147 msec     Systemic VTI:  0.22 m MV E velocity: 73.10 cm/s   Systemic Diam: 2.20 cm MV A velocity: 104.00 cm/s MV E/A ratio:  0.70 [name] Meres MD Electronically signed by [name] Meres MD Signature Date/Time: 05/12/2022/11:55:59 AM    Final    IR US Guide Bx Asp/Drain  Result Date: 05/11/2022 INDICATION: 51 year old male with multifocal hepatic abscesses. EXAM: Ultrasound-guided hepatic abscess aspiration MEDICATIONS: The patient is currently admitted to the hospital and receiving intravenous antibiotics. The antibiotics were administered within an appropriate time  frame prior to the initiation of the procedure. ANESTHESIA/SEDATION: Fentanyl 100 mcg IV; Versed 2 mg IV Moderate Sedation Time:  10 The patient was continuously monitored during the procedure by the interventional radiology nurse under my direct supervision. COMPLICATIONS: None immediate. PROCEDURE: Informed written consent was obtained from the patient after a thorough discussion of the procedural risks, benefits and alternatives. All questions were addressed. Maximal Sterile Barrier Technique was utilized including caps, mask, sterile gowns, sterile gloves, sterile drape, hand hygiene and skin antiseptic. A timeout was performed prior to the initiation of the procedure. Preprocedure ultrasound evaluation of the right upper quadrant demonstrated multifocal hypoechoic rounded lesions throughout the hepatic parenchyma, most measuring 1 cm or less. An approximately 1.9 cm collection in the right lateral liver was targeted for aspiration. Subdermal Local anesthesia was provided at the planned needle entry site with 1% lidocaine. Under direct ultrasound visualization, additional local anesthetic was administered about the hepatic capsule. Under direct ultrasound visualization, a 19 gauge, 10 cm Yueh needle was advanced to the central aspect of the fluid collection. Aspiration was performed yielding approximately 5 mL of purulence fluid. The sample was sent to the lab for culture. The needle was removed. Postprocedure ultrasound demonstrated no evidence of perihepatic hematoma or other complicating features. A sterile bandage was applied. The patient was returned to the floor in stable condition. IMPRESSION: Technically successful ultrasound-guided aspiration of hepatic abscess in the right lobe of the liver. Marliss Coots, MD [facility] Electronically Signed   By: Marliss Coots M.D.   On: 05/11/2022 00:31   MR ABDOMEN W WO CONTRAST  Result Date:  05/10/2022 CLINICAL DATA:  51 year old male with history of fever, nausea and vomiting. Multiple indeterminate liver lesions noted on CT examination. Follow-up study. EXAM: MRI ABDOMEN WITHOUT AND WITH CONTRAST TECHNIQUE: Multiplanar multisequence MR imaging of the abdomen was performed both before and after the administration of intravenous contrast. CONTRAST:  17mL GADAVIST GADOBUTROL 1 MMOL/ML IV SOLN COMPARISON:  No prior abdominal MRI. CT the abdomen and pelvis 05/09/2022. FINDINGS: Lower chest: Unremarkable. Hepatobiliary:  Scattered throughout the liver there are multiple T1 hypointense, T2 hyperintense lesions which are centrally hypovascular but demonstrate peripheral rim enhancement and several demonstrate surrounding perfusion anomalies. These lesions all restrict diffusion. The largest of these lesions is in segment 6 (axial image 32 of series 4 and coronal image 10 of series 3) measuring 3.9 x 2.5 x 3.0 cm. No intrahepatic biliary ductal dilatation. Gallbladder is unremarkable in appearance. Pancreas: No pancreatic mass. No pancreatic ductal dilatation. No pancreatic or peripancreatic fluid collections or inflammatory changes. Spleen:  Unremarkable. Adrenals/Urinary Tract: Bilateral kidneys and bilateral adrenal glands are normal in appearance. No hydroureteronephrosis in the visualized portions of the abdomen. Stomach/Bowel: Visualized portions are unremarkable. Vascular/Lymphatic: No aneurysm identified in the visualized abdominal vasculature. No lymphadenopathy noted in the abdomen. Other: No significant volume of ascites noted in the visualized portions of the peritoneal cavity. Musculoskeletal: No aggressive appearing osseous lesions are noted in the visualized portions of the skeleton. IMPRESSION: 1. Multiple liver lesions scattered throughout the hepatic parenchyma with imaging characteristics most indicative of hepatic abscesses. Several of these have some large perfusion anomalies associated  with them. Repeat abdominal MRI is recommended in 3 months to ensure regression of these findings following therapy. Electronically Signed   By: Trudie Reed M.D.   On: 05/10/2022 06:52   CT ABDOMEN PELVIS W CONTRAST  Result Date: 05/09/2022 CLINICAL DATA:  Nausea and vomiting.  Fever. EXAM: CT ABDOMEN AND PELVIS WITH CONTRAST TECHNIQUE: Multidetector CT imaging of the abdomen and pelvis was performed using the standard protocol following bolus administration of intravenous contrast. RADIATION DOSE REDUCTION: This exam was performed according to the departmental dose-optimization program which includes automated exposure control, adjustment of the mA and/or kV according to patient size and/or use of iterative reconstruction technique. CONTRAST:  OMNIPAQUE IOHEXOL 300 MG/ML  SOLN COMPARISON:  Abdominal ultrasound 05/09/2022 FINDINGS: Lower chest: Dependent subsegmental atelectasis in both lower lobes. Hepatobiliary: Suspected hepatic steatosis. Scattered hypodense renal lesions are present, these have greater than fluid density and some have marginal irregularity or nodularity such as the hypodense lesion on image 21 of series 3 which measures 2.4 by 1.5 cm, and the hypodense lesion on image 32 of series 3 which measures 3.9 by 2.5 cm. Accordingly these lesions are nonspecific, hepatic protocol MRI with and without contrast is once again recommended for definitive characterization. The possibility of hepatic abscesses is not excluded in this patient with fever although the seem to have less marginal enhancement than I would expect for abscesses. No biliary dilatation. Pancreas: Unremarkable Spleen: Unremarkable Adrenals/Urinary Tract: Unremarkable Stomach/Bowel: The appendix is poorly seen but appears to extend from the cecum on images 49 through 57 of series 7, this structure has a diameter of 1.0 cm reason the possibility of acute appendicitis. The distal margin of the structures obscured by bowel  loops. Vascular/Lymphatic: Unremarkable Reproductive: Unremarkable Other: No supplemental non-categorized findings. Musculoskeletal: Likely physiologic anterior wedging at T11. IMPRESSION: 1. Multiple complex hepatic lesions, measuring higher than fluid density, some with somewhat irregular or nodular margins. Hepatic abscesses are a possibility in this patient with fever. Other types of mass cannot be excluded. Metastatic disease to the liver is not completely excluded. Recommend hepatic protocol MRI with and without contrast. 2. A retrocecal tubular structure thought to probably represent appendix is thickened at 1.0 cm in diameter. The distal margin of this structure is obscured by adjacent loops of bowel. The appearance raises suspicion for potential acute appendicitis. Correlate with right lower quadrant tenderness or other characteristic  physical exam findings. 3. Hepatic steatosis. Electronically Signed   By: Gaylyn Rong M.D.   On: 05/09/2022 11:57   US Abdomen Limited RUQ (LIVER/GB)  Result Date: 05/09/2022 CLINICAL DATA:  Provided history: Pain. Jaundice. EXAM: ULTRASOUND ABDOMEN LIMITED RIGHT UPPER QUADRANT COMPARISON:  No pertinent prior exams available for comparison. FINDINGS: Gallbladder: No gallstones or wall thickening visualized. No sonographic Murphy sign noted by sonographer. Common bile duct: Diameter: 2-3 mm, within normal limits. Liver: There are multiple round to ovoid hypoechoic lesions within the right hepatic lobe, measuring 2.4 x 2.9 x 1.8 cm, 1.5 x 2.1 x 1.4 cm, 2.5 x 1.6 x 2.3 cm and 1.5 x 1.3 x 1.6 cm. There is background increased hepatic parenchymal echogenicity. Portal vein is patent on color Doppler imaging with normal direction of blood flow towards the liver. Impression #1 will be called to the ordering clinician or representative by the Radiologist Assistant, and communication documented in the PACS or [employer]. IMPRESSION: 1. Multiple round to ovoid hypoechoic  lesions within the right hepatic lobe, measuring up to 2.4 x 2.9 x 1.8 cm. These lesions may reflect cysts and/or masses. A liver protocol abdominal MRI without and with contrast is recommended for further evaluation. 2. Background increased hepatic parenchymal echogenicity. This is a nonspecific finding, which may be seen in the setting of hepatic steatosis or other chronic hepatic parenchymal disease. 3. Otherwise unremarkable right upper quadrant ultrasound, as described. Electronically Signed   By: Jackey Loge D.O.   On: 05/09/2022 10:33   DG Chest 2 View  Result Date: 05/09/2022 CLINICAL DATA:  A 51 year old male presents with fever. EXAM: CHEST - 2 VIEW COMPARISON:  None Available. FINDINGS: Trachea midline. Cardiomediastinal contours and hilar structures are normal. No sign of pleural effusion. On limited assessment no acute skeletal findings. There is a fracture of the distal third of the LEFT clavicle which may be subacute or chronic. IMPRESSION: 1. No acute cardiopulmonary disease. 2. Fracture of the distal third of the LEFT clavicle which may be subacute or chronic. Correlate with any pain in this area with patient history. Electronically Signed   By: Donzetta Kohut M.D.   On: 05/09/2022 09:23

## 2022-05-13 NOTE — Progress Notes (Signed)
[facility]   Reason for visit: Follow up on pyogenic liver abscess  Interval History:  Tmax 101.2, trending down.  WBC down to 14.1.  Still with headache with fever.    Physical Exam: Constitutional:  Vitals:   05/13/22 0420 05/13/22 0813  BP: 119/79 126/72  Pulse: 81 91  Resp: 18 17  Temp: 99.7 F (37.6 C) 98.3 F (36.8 C)  SpO2: 97% 97%   patient appears in NAD Respiratory: Normal respiratory effort Cardiovascular: RRR   Review of Systems: Constitutional: positive for fevers, chills, and malaise Neurological: positive for headaches  Lab Results  Component Value Date   WBC 14.1 (H) 05/13/2022   HGB 12.2 (L) 05/13/2022   HCT 36.3 (L) 05/13/2022   MCV 88.8 05/13/2022   PLT 155 05/13/2022    Lab Results  Component Value Date   CREATININE 0.75 05/13/2022   BUN 7 05/13/2022   NA 138 05/13/2022   K 3.9 05/13/2022   CL 101 05/13/2022   CO2 25 05/13/2022    Lab Results  Component Value Date   ALT 83 (H) 05/13/2022   AST 29 05/13/2022   ALKPHOS 134 (H) 05/13/2022     Microbiology: Recent Results (from the past 240 hour(s))  SARS Coronavirus 2 by RT PCR (hospital order, performed in [facility] hospital lab) *cepheid single result test* Anterior Nasal Swab     Status: None   Collection Time: 05/09/22  5:43 AM   Specimen: Anterior Nasal Swab  Result Value Ref Range Status   SARS Coronavirus 2 by RT PCR NEGATIVE NEGATIVE Final    Comment: (NOTE) SARS-CoV-2 target nucleic acids are NOT DETECTED.  The SARS-CoV-2 RNA is generally detectable in upper and lower respiratory specimens during the acute phase of infection. The lowest concentration of SARS-CoV-2 viral copies this assay can detect is 250 copies / mL. A negative result does not preclude SARS-CoV-2 infection and should not be used as the sole basis for treatment or other patient management decisions.  A negative result may occur with improper specimen collection / handling,  submission of specimen other than nasopharyngeal swab, presence of viral mutation(s) within the areas targeted by this assay, and inadequate number of viral copies (<250 copies / mL). A negative result must be combined with clinical observations, patient history, and epidemiological information.  Fact Sheet for Patients:   RoadLapTop.co.za  Fact Sheet for Healthcare Providers: http://kim-miller.com/  This test is not yet approved or  cleared by the [place] FDA and has been authorized for detection and/or diagnosis of SARS-CoV-2 by FDA under an Emergency Use Authorization (EUA).  This EUA will remain in effect (meaning this test can be used) for the duration of the COVID-19 declaration under Section 564(b)(1) of the Act, 21 U.S.C. section 360bbb-3(b)(1), unless the authorization is terminated or revoked sooner.  Performed at [facility], [address] [place], [place] [address]   Blood culture (routine x 2)     Status: None (Preliminary result)   Collection Time: 05/09/22  8:38 AM   Specimen: BLOOD RIGHT ARM  Result Value Ref Range Status   Specimen Description BLOOD RIGHT ARM  Final   Special Requests   Final    BOTTLES DRAWN AEROBIC AND ANAEROBIC Blood Culture results may not be optimal due to an inadequate volume of blood received in culture bottles   Culture   Final    NO GROWTH 4 DAYS Performed at [facility], [address], [place], [place] [address]    Report Status PENDING  Incomplete  Blood culture (routine x 2)     Status: None (Preliminary result)   Collection Time: 05/09/22  8:43 AM   Specimen: BLOOD RIGHT HAND  Result Value Ref Range Status   Specimen Description BLOOD RIGHT HAND  Final   Special Requests   Final    BOTTLES DRAWN AEROBIC AND ANAEROBIC Blood Culture adequate volume   Culture   Final    NO GROWTH 4 DAYS Performed at [facility], [address], [place], [place] [address]     Report Status PENDING  Incomplete  MRSA Next Gen by PCR, Nasal     Status: None   Collection Time: 05/10/22  6:31 AM   Specimen: Nasal Mucosa; Nasal Swab  Result Value Ref Range Status   MRSA by PCR Next Gen NOT DETECTED NOT DETECTED Final    Comment: (NOTE) The GeneXpert MRSA Assay (FDA approved for NASAL specimens only), is one component of a comprehensive MRSA colonization surveillance program. It is not intended to diagnose MRSA infection nor to guide or monitor treatment for MRSA infections. Test performance is not FDA approved in patients less than 65 years old. Performed at [facility], [address] [place], [place] [address]   Aerobic/Anaerobic Culture w Gram Stain (surgical/deep wound)     Status: None (Preliminary result)   Collection Time: 05/10/22  5:33 PM   Specimen: Liver; Abscess  Result Value Ref Range Status   Specimen Description LIVER  Final   Special Requests ABSCESS  Final   Gram Stain   Final    ABUNDANT WBC PRESENT,BOTH PMN AND MONONUCLEAR NO ORGANISMS SEEN    Culture   Final    NO GROWTH 2 DAYS Performed at [facility], [address], [place], [place] [address]    Report Status PENDING  Incomplete    Impression/Plan:  1. Pyogenic liver abscess - no growth on the culture to date but slow improvement in fever and WBC on current treatment.  He will require a prolonged course of antibiotics and can use oral amoxicillin/clavulanate at discharge for 3 weeks. I will arrange follow up with [place] and we can rescan with a CT at that time.   2.  Transaminitis - from #1 and trending down near normal.    3.  CT scan with possible appendicitis - seen by general surgery and clinically not c/w radiographic findings.  No abdominal pain and followed by surgery.

## 2022-05-13 NOTE — Progress Notes (Signed)
Patient ID: Samuel Patterson, male   DOB: 1971/01/17, 51 y.o.   MRN: [id number] [facility] Progress Note     Subjective: CC-  Feels about the same as yesterday. Continues to have headache and fever up to 101.2. he did tolerate a little more to eat yesterday. No longer having dry heaves. No emesis. Passing flatus, no BM. WBC down 14  Objective: Vital signs in last 24 hours: Temp:  [98.6 F (37 C)-101.2 F (38.4 C)] 99.7 F (37.6 C) (06/20 0420) Pulse Rate:  [81-99] 81 (06/20 0420) Resp:  [16-18] 18 (06/20 0420) BP: (119-122)/(79-82) 119/79 (06/20 0420) SpO2:  [97 %-98 %] 97 % (06/20 0420) Last BM Date : 05/09/22  Intake/Output from previous day: No intake/output data recorded. Intake/Output this shift: No intake/output data recorded.  PE: Gen:  Alert, NAD, pleasant Abd: Soft, ND, NT, +BS Psych: A&Ox3   Lab Results:  Recent Labs    05/12/22 0432 05/13/22 0435  WBC 16.4* 14.1*  HGB 11.5* 12.2*  HCT 34.0* 36.3*  PLT 121* 155   BMET Recent Labs    05/12/22 0432 05/13/22 0435  NA 132* 138  K 3.2* 3.9  CL 101 101  CO2 23 25  GLUCOSE 128* 113*  BUN 8 7  CREATININE 0.80 0.75  CALCIUM 8.0* 8.3*   PT/INR No results for input(s): "LABPROT", "INR" in the last 72 hours. CMP     Component Value Date/Time   NA 138 05/13/2022 0435   K 3.9 05/13/2022 0435   CL 101 05/13/2022 0435   CO2 25 05/13/2022 0435   GLUCOSE 113 (H) 05/13/2022 0435   BUN 7 05/13/2022 0435   CREATININE 0.75 05/13/2022 0435   CALCIUM 8.3 (L) 05/13/2022 0435   PROT 6.2 (L) 05/13/2022 0435   ALBUMIN 2.3 (L) 05/13/2022 0435   AST 29 05/13/2022 0435   ALT 83 (H) 05/13/2022 0435   ALKPHOS 134 (H) 05/13/2022 0435   BILITOT 1.0 05/13/2022 0435   GFRNONAA >60 05/13/2022 0435   Lipase     Component Value Date/Time   LIPASE 23 05/09/2022 0556       Studies/Results: ECHOCARDIOGRAM COMPLETE  Result Date: 05/12/2022    ECHOCARDIOGRAM REPORT   Patient Name:   Samuel Patterson  Date of Exam: 05/12/2022 Medical Rec #:  [id number]       Height:       71.0 in Accession #:    [id number]      Weight:       211.6 lb Date of Birth:  [birth year]       BSA:          2.160 m Patient Age:    77 years        BP:           128/77 mmHg Patient Gender: M               HR:           114 bpm. Exam Location:  Inpatient Procedure: 2D Echo, Cardiac Doppler and Color Doppler Indications:    CHF  History:        Patient has no prior history of Echocardiogram examinations.  Sonographer:    Samuel Patterson Referring Phys: Samuel Patterson  1. Left ventricular ejection fraction, by estimation, is 60 to 65%. The left ventricle has normal function. The left ventricle has no regional wall motion abnormalities. Left ventricular diastolic parameters are consistent with Grade I diastolic dysfunction (impaired relaxation).  2. Right ventricular systolic function is normal. The right ventricular size is normal.  3. Left atrial size was mildly dilated.  4. The mitral valve is normal in structure. Trivial mitral valve regurgitation. No evidence of mitral stenosis.  5. The aortic valve is normal in structure. Aortic valve regurgitation is not visualized. No aortic stenosis is present.  6. The inferior vena cava is normal in size with greater than 50% respiratory variability, suggesting right atrial pressure of 3 mmHg. FINDINGS  Left Ventricle: Left ventricular ejection fraction, by estimation, is 60 to 65%. The left ventricle has normal function. The left ventricle has no regional wall motion abnormalities. The left ventricular internal cavity size was normal in size. There is  no left ventricular hypertrophy. Left ventricular diastolic parameters are consistent with Grade I diastolic dysfunction (impaired relaxation). Right Ventricle: The right ventricular size is normal. No increase in right ventricular wall thickness. Right ventricular systolic function is normal. Left Atrium: Left atrial size was mildly  dilated. Right Atrium: Right atrial size was normal in size. Pericardium: There is no evidence of pericardial effusion. Mitral Valve: The mitral valve is normal in structure. Trivial mitral valve regurgitation. No evidence of mitral valve stenosis. Tricuspid Valve: The tricuspid valve is normal in structure. Tricuspid valve regurgitation is trivial. No evidence of tricuspid stenosis. Aortic Valve: The aortic valve is normal in structure. Aortic valve regurgitation is not visualized. No aortic stenosis is present. Aortic valve peak gradient measures 10.1 mmHg. Pulmonic Valve: The pulmonic valve was normal in structure. Pulmonic valve regurgitation is not visualized. No evidence of pulmonic stenosis. Aorta: The aortic root is normal in size and structure. Venous: The inferior vena cava is normal in size with greater than 50% respiratory variability, suggesting right atrial pressure of 3 mmHg. IAS/Shunts: No atrial level shunt detected by color flow Doppler.  LEFT VENTRICLE PLAX 2D LVIDd:         5.30 cm   Diastology LVIDs:         3.60 cm   LV e' medial:    5.44 cm/s LV PW:         1.00 cm   LV E/e' medial:  13.4 LV IVS:        1.00 cm   LV e' lateral:   10.70 cm/s LVOT diam:     2.20 cm   LV E/e' lateral: 6.8 LV SV:         84 LV SV Index:   39 LVOT Area:     3.80 cm  RIGHT VENTRICLE          IVC RV Basal diam:  3.00 cm  IVC diam: 1.60 cm TAPSE (M-mode): 2.6 cm LEFT ATRIUM             Index        RIGHT ATRIUM           Index LA diam:        3.70 cm 1.71 cm/m   RA Area:     13.10 cm LA Vol (A2C):   50.0 ml 23.15 ml/m  RA Volume:   30.80 ml  14.26 ml/m LA Vol (A4C):   52.2 ml 24.17 ml/m LA Biplane Vol: 51.2 ml 23.71 ml/m  AORTIC VALVE                 PULMONIC VALVE AV Area (Vmax): 3.25 cm     PV Vmax:       1.04 m/s AV Vmax:  159.00 cm/s  PV Peak grad:  4.3 mmHg AV Peak Grad:   10.1 mmHg LVOT Vmax:      136.00 cm/s LVOT Vmean:     87.200 cm/s LVOT VTI:       0.221 m  AORTA Ao Root diam: 3.30 cm Ao Asc  diam:  3.20 cm MITRAL VALVE MV Area (PHT): 5.16 cm     SHUNTS MV Decel Time: 147 msec     Systemic VTI:  0.22 m MV E velocity: 73.10 cm/s   Systemic Diam: 2.20 cm MV A velocity: 104.00 cm/s MV E/A ratio:  0.70 Samuel Patterson Meres MD Electronically signed by Samuel Patterson Meres MD Signature Date/Time: 05/12/2022/11:55:59 AM    Final     Anti-infectives: Anti-infectives (From admission, onward)    Start     Dose/Rate Route Frequency Ordered Stop   05/12/22 1200  Ampicillin-Sulbactam (UNASYN) 3 g in sodium chloride 0.9 % 100 mL IVPB        3 g 200 mL/hr over 30 Minutes Intravenous Every 6 hours 05/12/22 1023     05/10/22 1400  cefTRIAXone (ROCEPHIN) 2 g in sodium chloride 0.9 % 100 mL IVPB  Status:  Discontinued        2 g 200 mL/hr over 30 Minutes Intravenous Every 24 hours 05/09/22 1511 05/12/22 1023   05/09/22 1245  metroNIDAZOLE (FLAGYL) IVPB 500 mg  Status:  Discontinued        500 mg 100 mL/hr over 60 Minutes Intravenous Every 12 hours 05/09/22 1230 05/12/22 1023   05/09/22 1245  cefTRIAXone (ROCEPHIN) 2 g in sodium chloride 0.9 % 100 mL IVPB        2 g 200 mL/hr over 30 Minutes Intravenous  Once 05/09/22 1230 05/09/22 1550        Assessment/Plan Hepatic Lesions concerning for Hepatic Abscesses  Fever  Leukocytosis  Elevated LFT's  Possible Acute Appendicitis  - MRCP with multiple liver lesions most indicative of hepatic abscesses. No travel outside of the country or recent travel. No IVDU. Hep and HIV panel neg. Unclear etiology. ECHO 6/19 ok, further work up per primary team - S/p IR aspiration with purulent fluid obtained. Cx with NGTD, report pending. WBC is trending down today fever curve may be improving (last fever around 1600 yesterday) - Continue abx per ID - CT A/P had concerns for appendicitis. His hx and exam are not c/w this. Continues to have no abdominal pain and no tenderness on exam. No indication for emergency surgery. He is already on abx to cover hepatic abscesses  which should also cover for this  - We will follow with you   FEN - soft diet, IVF per [facility] VTE -SCDs, okay for chemical prophylaxis from a general surgery standpoint ID - rocephin/flagyl 6/16>>6/19, unasyn 6/19>>   - Per [facility] -  Hypokalemia  Hyperglycemia  ? L clavicle fx    I reviewed hospitalist notes, last 24 h vitals and pain scores, last 48 h intake and output, last 24 h labs and trends, and last 24 h imaging results.    LOS: 3 days    Franne Forts, [facility] 05/13/2022, 8:10 AM Please see Amion for pager number during day hours 7:00am-4:30pm

## 2022-05-14 DIAGNOSIS — K750 Abscess of liver: Secondary | ICD-10-CM

## 2022-05-14 DIAGNOSIS — G47419 Narcolepsy without cataplexy: Secondary | ICD-10-CM | POA: Diagnosis not present

## 2022-05-14 DIAGNOSIS — Z789 Other specified health status: Secondary | ICD-10-CM | POA: Diagnosis not present

## 2022-05-14 LAB — CBC WITH DIFFERENTIAL/PLATELET
Abs Immature Granulocytes: 0.47 10*3/uL — ABNORMAL HIGH (ref 0.00–0.07)
Basophils Absolute: 0.1 10*3/uL (ref 0.0–0.1)
Basophils Relative: 1 %
Eosinophils Absolute: 0.1 10*3/uL (ref 0.0–0.5)
Eosinophils Relative: 1 %
HCT: 35.5 % — ABNORMAL LOW (ref 39.0–52.0)
Hemoglobin: 11.8 g/dL — ABNORMAL LOW (ref 13.0–17.0)
Immature Granulocytes: 3 %
Lymphocytes Relative: 8 %
Lymphs Abs: 1.2 10*3/uL (ref 0.7–4.0)
MCH: 29.6 pg (ref 26.0–34.0)
MCHC: 33.2 g/dL (ref 30.0–36.0)
MCV: 89.2 fL (ref 80.0–100.0)
Monocytes Absolute: 1.2 10*3/uL — ABNORMAL HIGH (ref 0.1–1.0)
Monocytes Relative: 8 %
Neutro Abs: 11.2 10*3/uL — ABNORMAL HIGH (ref 1.7–7.7)
Neutrophils Relative %: 79 %
Platelets: 204 10*3/uL (ref 150–400)
RBC: 3.98 MIL/uL — ABNORMAL LOW (ref 4.22–5.81)
RDW: 13.5 % (ref 11.5–15.5)
WBC: 14.1 10*3/uL — ABNORMAL HIGH (ref 4.0–10.5)
nRBC: 0 % (ref 0.0–0.2)

## 2022-05-14 LAB — MAGNESIUM: Magnesium: 2.1 mg/dL (ref 1.7–2.4)

## 2022-05-14 LAB — COMPREHENSIVE METABOLIC PANEL
ALT: 62 U/L — ABNORMAL HIGH (ref 0–44)
AST: 24 U/L (ref 15–41)
Albumin: 2.3 g/dL — ABNORMAL LOW (ref 3.5–5.0)
Alkaline Phosphatase: 143 U/L — ABNORMAL HIGH (ref 38–126)
Anion gap: 9 (ref 5–15)
BUN: 6 mg/dL (ref 6–20)
CO2: 22 mmol/L (ref 22–32)
Calcium: 7.9 mg/dL — ABNORMAL LOW (ref 8.9–10.3)
Chloride: 103 mmol/L (ref 98–111)
Creatinine, Ser: 0.77 mg/dL (ref 0.61–1.24)
GFR, Estimated: >60 mL/min (ref >60)
Glucose, Bld: 123 mg/dL — ABNORMAL HIGH (ref 70–99)
Potassium: 3.1 mmol/L — ABNORMAL LOW (ref 3.5–5.1)
Sodium: 134 mmol/L — ABNORMAL LOW (ref 135–145)
Total Bilirubin: 1.1 mg/dL (ref 0.3–1.2)
Total Protein: 6 g/dL — ABNORMAL LOW (ref 6.5–8.1)

## 2022-05-14 LAB — CULTURE, BLOOD (ROUTINE X 2)
Culture: NO GROWTH
Culture: NO GROWTH
Special Requests: ADEQUATE

## 2022-05-14 LAB — PROCALCITONIN: Procalcitonin: 1.13 ng/mL

## 2022-05-14 MED ORDER — POTASSIUM CHLORIDE CRYS ER 20 MEQ PO TBCR
40.0000 meq | EXTENDED_RELEASE_TABLET | Freq: Four times a day (QID) | ORAL | Status: AC
  Administered 2022-05-14 (×2): 40 meq via ORAL
  Filled 2022-05-14 (×2): qty 2

## 2022-05-14 MED ORDER — DOCUSATE SODIUM 100 MG PO CAPS
100.0000 mg | ORAL_CAPSULE | Freq: Two times a day (BID) | ORAL | Status: DC
  Administered 2022-05-14 – 2022-05-15 (×3): 100 mg via ORAL
  Filled 2022-05-14 (×3): qty 1

## 2022-05-14 NOTE — Progress Notes (Signed)
[facility]   Reason for visit: follow up on pyogenic liver abscess  Interval History:  Tmax 100.8 over last 24 hours; WBC 14.1. No headache now   Physical Exam: Constitutional:  Vitals:   05/14/22 0517 05/14/22 0812  BP: 132/85 129/81  Pulse: 71 80  Resp: 14 16  Temp: 98.2 F (36.8 C) 98.1 F (36.7 C)  SpO2: 94% 98%  He is in nad Respiratory: normal respiratory effort GI: soft   Review of Systems: Constitutional: positive for fever Neurological: negative for headache  Lab Results  Component Value Date   WBC 14.1 (H) 05/14/2022   HGB 11.8 (L) 05/14/2022   HCT 35.5 (L) 05/14/2022   MCV 89.2 05/14/2022   PLT 204 05/14/2022    Lab Results  Component Value Date   CREATININE 0.77 05/14/2022   BUN 6 05/14/2022   NA 134 (L) 05/14/2022   K 3.1 (L) 05/14/2022   CL 103 05/14/2022   CO2 22 05/14/2022    Lab Results  Component Value Date   ALT 62 (H) 05/14/2022   AST 24 05/14/2022   ALKPHOS 143 (H) 05/14/2022     Microbiology: Recent Results (from the past 240 hour(s))  SARS Coronavirus 2 by RT PCR (hospital order, performed in [facility] hospital lab) *cepheid single result test* Anterior Nasal Swab     Status: None   Collection Time: 05/09/22  5:43 AM   Specimen: Anterior Nasal Swab  Result Value Ref Range Status   SARS Coronavirus 2 by RT PCR NEGATIVE NEGATIVE Final    Comment: (NOTE) SARS-CoV-2 target nucleic acids are NOT DETECTED.  The SARS-CoV-2 RNA is generally detectable in upper and lower respiratory specimens during the acute phase of infection. The lowest concentration of SARS-CoV-2 viral copies this assay can detect is 250 copies / mL. A negative result does not preclude SARS-CoV-2 infection and should not be used as the sole basis for treatment or other patient management decisions.  A negative result may occur with improper specimen collection / handling, submission of specimen other than nasopharyngeal swab,  presence of viral mutation(s) within the areas targeted by this assay, and inadequate number of viral copies (<250 copies / mL). A negative result must be combined with clinical observations, patient history, and epidemiological information.  Fact Sheet for Patients:   RoadLapTop.co.za  Fact Sheet for Healthcare Providers: http://kim-miller.com/  This test is not yet approved or  cleared by the [place] FDA and has been authorized for detection and/or diagnosis of SARS-CoV-2 by FDA under an Emergency Use Authorization (EUA).  This EUA will remain in effect (meaning this test can be used) for the duration of the COVID-19 declaration under Section 564(b)(1) of the Act, 21 U.S.C. section 360bbb-3(b)(1), unless the authorization is terminated or revoked sooner.  Performed at [facility], [address] [place], [place] [address]   Blood culture (routine x 2)     Status: None   Collection Time: 05/09/22  8:38 AM   Specimen: BLOOD RIGHT ARM  Result Value Ref Range Status   Specimen Description BLOOD RIGHT ARM  Final   Special Requests   Final    BOTTLES DRAWN AEROBIC AND ANAEROBIC Blood Culture results may not be optimal due to an inadequate volume of blood received in culture bottles   Culture   Final    NO GROWTH 5 DAYS Performed at [facility], [address], [place], [place] [address]    Report  Status 05/14/2022 FINAL  Final  Blood culture (routine x 2)     Status: None   Collection Time: 05/09/22  8:43 AM   Specimen: BLOOD RIGHT HAND  Result Value Ref Range Status   Specimen Description BLOOD RIGHT HAND  Final   Special Requests   Final    BOTTLES DRAWN AEROBIC AND ANAEROBIC Blood Culture adequate volume   Culture   Final    NO GROWTH 5 DAYS Performed at [facility], [address], [place], [place] [address]    Report Status 05/14/2022 FINAL  Final  MRSA Next Gen by PCR, Nasal     Status: None    Collection Time: 05/10/22  6:31 AM   Specimen: Nasal Mucosa; Nasal Swab  Result Value Ref Range Status   MRSA by PCR Next Gen NOT DETECTED NOT DETECTED Final    Comment: (NOTE) The GeneXpert MRSA Assay (FDA approved for NASAL specimens only), is one component of a comprehensive MRSA colonization surveillance program. It is not intended to diagnose MRSA infection nor to guide or monitor treatment for MRSA infections. Test performance is not FDA approved in patients less than 4 years old. Performed at [facility], [address] [place], [place] [address]   Aerobic/Anaerobic Culture w Gram Stain (surgical/deep wound)     Status: None (Preliminary result)   Collection Time: 05/10/22  5:33 PM   Specimen: Liver; Abscess  Result Value Ref Range Status   Specimen Description LIVER  Final   Special Requests ABSCESS  Final   Gram Stain   Final    ABUNDANT WBC PRESENT,BOTH PMN AND MONONUCLEAR NO ORGANISMS SEEN    Culture   Final    NO GROWTH 3 DAYS NO ANAEROBES ISOLATED; CULTURE IN PROGRESS FOR 5 DAYS Performed at [facility], [address], [place], [place] [address]    Report Status PENDING  Incomplete    Impression/Plan:  1. Pyogenic liver abscess - improving with antibiotics and he will continue with ampicillin/sulbactam and transition to oral amoxicillin/clavulanate for a plan of 3 weeks at discharge.  He has follow up with Dr. Renold Don on 7/11 to consider reimaging with a CT scan and follow up.    2. Transaminitis - continues to trend to normal.  No new concerns.    3.  CT scan with possible appendicitis - clinically he has been stable with no abdominal pain and surgery has followed with serial exams.  No concerns and not c/w appendititis.    I will sign off, call with questions.

## 2022-05-14 NOTE — Progress Notes (Signed)
Patient ID: Samuel Patterson, male   DOB: [birth year], 51 y.o.   MRN: [id number] [facility] Surgery Progress Note     Subjective: CC-  Feeling 80% better than when he came in. Denies abdominal pain, nausea, vomiting. Passing flatus, no BM. TMAX 100.8 last 24 hours  Objective: Vital signs in last 24 hours: Temp:  [98.2 F (36.8 C)-100.8 F (38.2 C)] 98.2 F (36.8 C) (06/21 0517) Pulse Rate:  [71-91] 71 (06/21 0517) Resp:  [14-17] 14 (06/21 0517) BP: (120-140)/(72-86) 132/85 (06/21 0517) SpO2:  [94 %-98 %] 94 % (06/21 0517) Last BM Date : 05/09/22  Intake/Output from previous day: 06/20 0701 - 06/21 0700 In: 1916.4 [P.O.:240; I.V.:1676.4] Out: -  Intake/Output this shift: No intake/output data recorded.  PE: Gen:  Alert, NAD, pleasant Abd: Soft, ND, NT, +BS Psych: A&Ox3  Lab Results:  Recent Labs    05/13/22 0435 05/14/22 0342  WBC 14.1* 14.1*  HGB 12.2* 11.8*  HCT 36.3* 35.5*  PLT 155 204   BMET Recent Labs    05/13/22 0435 05/14/22 0342  NA 138 134*  K 3.9 3.1*  CL 101 103  CO2 25 22  GLUCOSE 113* 123*  BUN 7 6  CREATININE 0.75 0.77  CALCIUM 8.3* 7.9*   PT/INR No results for input(s): "LABPROT", "INR" in the last 72 hours. CMP     Component Value Date/Time   NA 134 (L) 05/14/2022 0342   K 3.1 (L) 05/14/2022 0342   CL 103 05/14/2022 0342   CO2 22 05/14/2022 0342   GLUCOSE 123 (H) 05/14/2022 0342   BUN 6 05/14/2022 0342   CREATININE 0.77 05/14/2022 0342   CALCIUM 7.9 (L) 05/14/2022 0342   PROT 6.0 (L) 05/14/2022 0342   ALBUMIN 2.3 (L) 05/14/2022 0342   AST 24 05/14/2022 0342   ALT 62 (H) 05/14/2022 0342   ALKPHOS 143 (H) 05/14/2022 0342   BILITOT 1.1 05/14/2022 0342   GFRNONAA >60 05/14/2022 0342   Lipase     Component Value Date/Time   LIPASE 23 05/09/2022 0556       Studies/Results: ECHOCARDIOGRAM COMPLETE  Result Date: 05/12/2022    ECHOCARDIOGRAM REPORT   Patient Name:   Samuel Patterson Date of Exam: 05/12/2022 Medical Rec  #:  [id number]       Height:       71.0 in Accession #:    [id number]      Weight:       211.6 lb Date of Birth:  July 14, 1971       BSA:          2.160 m Patient Age:    50 years        BP:           128/77 mmHg Patient Gender: M               HR:           114 bpm. Exam Location:  Inpatient Procedure: 2D Echo, Cardiac Doppler and Color Doppler Indications:    CHF  History:        Patient has no prior history of Echocardiogram examinations.  Sonographer:    Eduard Roux Referring Phys: Effie Shy PRASHANT K [facility] IMPRESSIONS  1. Left ventricular ejection fraction, by estimation, is 60 to 65%. The left ventricle has normal function. The left ventricle has no regional wall motion abnormalities. Left ventricular diastolic parameters are consistent with Grade I diastolic dysfunction (impaired relaxation).  2. Right ventricular systolic function is normal.  The right ventricular size is normal.  3. Left atrial size was mildly dilated.  4. The mitral valve is normal in structure. Trivial mitral valve regurgitation. No evidence of mitral stenosis.  5. The aortic valve is normal in structure. Aortic valve regurgitation is not visualized. No aortic stenosis is present.  6. The inferior vena cava is normal in size with greater than 50% respiratory variability, suggesting right atrial pressure of 3 mmHg. FINDINGS  Left Ventricle: Left ventricular ejection fraction, by estimation, is 60 to 65%. The left ventricle has normal function. The left ventricle has no regional wall motion abnormalities. The left ventricular internal cavity size was normal in size. There is  no left ventricular hypertrophy. Left ventricular diastolic parameters are consistent with Grade I diastolic dysfunction (impaired relaxation). Right Ventricle: The right ventricular size is normal. No increase in right ventricular wall thickness. Right ventricular systolic function is normal. Left Atrium: Left atrial size was mildly dilated. Right Atrium: Right atrial  size was normal in size. Pericardium: There is no evidence of pericardial effusion. Mitral Valve: The mitral valve is normal in structure. Trivial mitral valve regurgitation. No evidence of mitral valve stenosis. Tricuspid Valve: The tricuspid valve is normal in structure. Tricuspid valve regurgitation is trivial. No evidence of tricuspid stenosis. Aortic Valve: The aortic valve is normal in structure. Aortic valve regurgitation is not visualized. No aortic stenosis is present. Aortic valve peak gradient measures 10.1 mmHg. Pulmonic Valve: The pulmonic valve was normal in structure. Pulmonic valve regurgitation is not visualized. No evidence of pulmonic stenosis. Aorta: The aortic root is normal in size and structure. Venous: The inferior vena cava is normal in size with greater than 50% respiratory variability, suggesting right atrial pressure of 3 mmHg. IAS/Shunts: No atrial level shunt detected by color flow Doppler.  LEFT VENTRICLE PLAX 2D LVIDd:         5.30 cm   Diastology LVIDs:         3.60 cm   LV e' medial:    5.44 cm/s LV PW:         1.00 cm   LV E/e' medial:  13.4 LV IVS:        1.00 cm   LV e' lateral:   10.70 cm/s LVOT diam:     2.20 cm   LV E/e' lateral: 6.8 LV SV:         84 LV SV Index:   39 LVOT Area:     3.80 cm  RIGHT VENTRICLE          IVC RV Basal diam:  3.00 cm  IVC diam: 1.60 cm TAPSE (M-mode): 2.6 cm LEFT ATRIUM             Index        RIGHT ATRIUM           Index LA diam:        3.70 cm 1.71 cm/m   RA Area:     13.10 cm LA Vol (A2C):   50.0 ml 23.15 ml/m  RA Volume:   30.80 ml  14.26 ml/m LA Vol (A4C):   52.2 ml 24.17 ml/m LA Biplane Vol: 51.2 ml 23.71 ml/m  AORTIC VALVE                 PULMONIC VALVE AV Area (Vmax): 3.25 cm     PV Vmax:       1.04 m/s AV Vmax:        159.00 cm/s  PV  Peak grad:  4.3 mmHg AV Peak Grad:   10.1 mmHg LVOT Vmax:      136.00 cm/s LVOT Vmean:     87.200 cm/s LVOT VTI:       0.221 m  AORTA Ao Root diam: 3.30 cm Ao Asc diam:  3.20 cm MITRAL VALVE MV Area  (PHT): 5.16 cm     SHUNTS MV Decel Time: 147 msec     Systemic VTI:  0.22 m MV E velocity: 73.10 cm/s   Systemic Diam: 2.20 cm MV A velocity: 104.00 cm/s MV E/A ratio:  0.70 [name] Meres MD Electronically signed by [name] Meres MD Signature Date/Time: 05/12/2022/11:55:59 AM    Final     Anti-infectives: Anti-infectives (From admission, onward)    Start     Dose/Rate Route Frequency Ordered Stop   05/13/22 0000  amoxicillin-clavulanate (AUGMENTIN) 875-125 MG tablet        1 tablet Oral 2 times daily 05/13/22 1326 06/03/22 2359   05/12/22 1200  Ampicillin-Sulbactam (UNASYN) 3 g in sodium chloride 0.9 % 100 mL IVPB        3 g 200 mL/hr over 30 Minutes Intravenous Every 6 hours 05/12/22 1023     05/10/22 1400  cefTRIAXone (ROCEPHIN) 2 g in sodium chloride 0.9 % 100 mL IVPB  Status:  Discontinued        2 g 200 mL/hr over 30 Minutes Intravenous Every 24 hours 05/09/22 1511 05/12/22 1023   05/09/22 1245  metroNIDAZOLE (FLAGYL) IVPB 500 mg  Status:  Discontinued        500 mg 100 mL/hr over 60 Minutes Intravenous Every 12 hours 05/09/22 1230 05/12/22 1023   05/09/22 1245  cefTRIAXone (ROCEPHIN) 2 g in sodium chloride 0.9 % 100 mL IVPB        2 g 200 mL/hr over 30 Minutes Intravenous  Once 05/09/22 1230 05/09/22 1550        Assessment/Plan Hepatic Lesions concerning for Hepatic Abscesses  Fever  Leukocytosis  Elevated LFT's  Possible Acute Appendicitis  - MRCP with multiple liver lesions most indicative of hepatic abscesses. No travel outside of the country or recent travel. No IVDU. Hep and HIV panel neg. Unclear etiology. ECHO 6/19 ok, further work up per primary team - S/p IR aspiration with purulent fluid obtained. Cx with NGTD, report pending.  - Continue abx per ID - CT A/P had concerns for appendicitis. His hx and exam are not c/w this. No indication for emergency surgery. He is already on abx to cover hepatic abscesses which should also cover for this  - WBC is stable and  fever curve improving. Clinically improving. Discussed with primary team, planning to continue IV antibiotics today and likely discharge home tomorrow on oral antibiotics if he continues to improve.    FEN - soft diet, IVF per [facility] VTE -SCDs, okay for chemical prophylaxis from a general surgery standpoint ID - rocephin/flagyl 6/16>>6/19, unasyn 6/19>>   - Per [facility] -  Hypokalemia  Hyperglycemia  ? L clavicle fx    I reviewed hospitalist notes, last 24 h vitals and pain scores, last 48 h intake and output, last 24 h labs and trends, and last 24 h imaging results.    LOS: 4 days    Franne Forts, [facility] Surgery 05/14/2022, 8:06 AM Please see Amion for pager number during day hours 7:00am-4:30pm

## 2022-05-14 NOTE — Progress Notes (Signed)
PROGRESS NOTE                                                                                                                                                                                                             Patient Demographics:    Samuel Patterson, is a 51 y.o. male, DOB - Sep 05, 1971, DXA:[id number]  Outpatient Primary MD for the patient is Loyola Mast, MD    LOS - 4  Admit date - 05/09/2022    Chief Complaint  Patient presents with   Fever   Vomiting       Brief Narrative (HPI from H&P)    51 y.o. male with history h/o GERD, diverticulosis, hypertriglyceridemia presents with persistent fevers over the last few days. Was evaluated by PCP on 6/13, had fever 102.6 F and felt to have viral illness possible, advised to take OTC antipyretics and encourage fluid intake.  Patient reports continued poor oral intake, high fevers with Tmax of 103F yesterday at home along with nausea, vomiting x1 this morning prompting ED visit, further work-up suggest liver abscess.   Subjective:   Patient in bed, appears comfortable, denies any headache, no fever, no chest pain or pressure, no shortness of breath , no abdominal pain. No new focal weakness.    Assessment  & Plan :    Sepsis due to liver abscess. No clear source, no recent travel, no history of GI issues or gallbladder issues in the past, remarkably pain-free and nontender.  GI, general surgery and ID called.  Seen by IR and underwent right lobe abscess aspiration with 5 cc of fluid removed on 05/10/2022.  Continue IV antibiotics most likely will require a PICC line with prolonged IV antibiotics well will defer this to ID.  Clinically much improved will give another day of IV antibiotic and then likely place him on 3 weeks of oral Augmentin and discharged home on 05/15/2022, discussed with general surgery and ID both on 05/12/2022, continue to follow on IV antibiotics here  followed by 4 weeks of PO Augmentin upon DC. Cultures thus far negative, stable echocardiogram, further management per ID and general surgery.  2.  Transaminitis.  Due to #1 above.  Trend is improving.  3.  Radiological question of acute appendicitis on CT scan.  Clinically ruled out, also cleared by general surgery.  4.  GERD.  PPI  5.  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  6.  Hypertriglyceridemia.  On fish oil.   7.  Hypokalemia.  Replaced.       Condition - Extremely Guarded  Family Communication  :  wife bedside 05/10/22, 05/12/2022, 05/13/2022, 05/14/2022  Code Status :  Full  Consults  :  GI, CCS, IR, ID  PUD Prophylaxis : PPI   Procedures  :     Echocardiogram.  1. Left ventricular ejection fraction, by estimation, is 60 to 65%. The left ventricle has normal function. The left ventricle has no regional wall motion abnormalities. Left ventricular diastolic parameters are consistent with Grade I diastolic dysfunction (impaired relaxation).  2. Right ventricular systolic function is normal. The right ventricular size is normal.  3. Left atrial size was mildly dilated.  4. The mitral valve is normal in structure. Trivial mitral valve regurgitation. No evidence of mitral stenosis.  5. The aortic valve is normal in structure. Aortic valve regurgitation is not visualized. No aortic stenosis is present.  6. The inferior vena cava is normal in size with greater than 50% respiratory variability, suggesting right atrial pressure of 3 mmHg.  By IR on 05/10/2022.  -  Ultrasound guided hepatic fluid collection aspiration, 5 cc fluid removed from right lobe abscess.  MRI - 1. Multiple liver lesions scattered throughout the hepatic parenchyma with imaging characteristics most indicative of hepatic abscesses. Several of these have some large perfusion anomalies associated with them. Repeat abdominal MRI is recommended in 3 months to ensure regression of these findings following therapy.      Disposition  Plan  :    Status is: Observation   DVT Prophylaxis  :    SCDs Start: 05/09/22 1514   Lab Results  Component Value Date   PLT 204 05/14/2022    Diet :  Diet Order             DIET SOFT Room service appropriate? Yes; Fluid consistency: Thin  Diet effective now                    Inpatient Medications  Scheduled Meds:  acetaminophen  500 mg Oral Once   docusate sodium  100 mg Oral BID   fluticasone  2 spray Each Nare Daily   methylphenidate  10 mg Oral BID   omega-3 acid ethyl esters  1 g Oral Daily   pantoprazole  40 mg Oral Daily   potassium chloride  40 mEq Oral Q6H   Continuous Infusions:  ampicillin-sulbactam (UNASYN) IV 3 g (05/14/22 0515)   promethazine (PHENERGAN) injection (IM or IVPB)     PRN Meds:.acetaminophen, albuterol, fentaNYL, ibuprofen, midazolam, ondansetron (ZOFRAN) IV, promethazine (PHENERGAN) injection (IM or IVPB), traMADol    Time Spent in minutes  30   Susa Raring M.D on 05/14/2022 at 8:56 AM  To page go to [email or web address]   [facility] -  Office  [phone]  See all Orders from today for further details    Objective:   Vitals:   05/13/22 2059 05/14/22 0148 05/14/22 0517 05/14/22 0812  BP: 140/86 136/86 132/85 129/81  Pulse: 90 88 71 80  Resp: 17 16 14 16   Temp: 100.2 F (37.9 C) (!) 100.8 F (38.2 C) 98.2 F (36.8 C) 98.1 F (36.7 C)  TempSrc: Oral Oral Oral   SpO2: 98% 97% 94% 98%  Weight:      Height:        Wt Readings from Last 3 Encounters:  05/10/22 96 kg  05/06/22 96 kg  01/30/22 100.2 kg    No intake or output data in the 24 hours ending 05/14/22 0856    Physical Exam  Awake Alert, No new F.N deficits, Normal affect [place].AT,PERRAL Supple Neck, No JVD,   Symmetrical Chest wall movement, Good air movement bilaterally, CTAB RRR,No Gallops, Rubs or new Murmurs,  +ve B.Sounds, Abd Soft, No tenderness,   No Cyanosis, Clubbing or edema     Data Review:    CBC Recent Labs  Lab  05/10/22 0033 05/11/22 0008 05/12/22 0432 05/13/22 0435 05/14/22 0342  WBC 9.1 12.3* 16.4* 14.1* 14.1*  HGB 11.7* 11.2* 11.5* 12.2* 11.8*  HCT 35.5* 32.9* 34.0* 36.3* 35.5*  PLT 110* 125* 121* 155 204  MCV 90.8 89.2 89.5 88.8 89.2  MCH 29.9 30.4 30.3 29.8 29.6  MCHC 33.0 34.0 33.8 33.6 33.2  RDW 13.2 13.2 13.3 13.5 13.5  LYMPHSABS  --  0.8 0.7 1.2 1.2  MONOABS  --  1.2* 1.3* 1.2* 1.2*  EOSABS  --  0.0 0.0 0.1 0.1  BASOSABS  --  0.0 0.1 0.1 0.1    Electrolytes Recent Labs  Lab 05/09/22 1609 05/09/22 1636 05/09/22 1636 05/10/22 0033 05/11/22 0008 05/12/22 0432 05/13/22 0435 05/14/22 0342  NA  --   --   --  133* 135 132* 138 134*  K  --   --   --  3.2* 3.5 3.2* 3.9 3.1*  CL  --   --   --  102 107 101 101 103  CO2  --   --   --  22 23 23 25 22   GLUCOSE  --   --   --  131* 119* 128* 113* 123*  BUN  --   --   --  8 8 8 7 6   CREATININE  --   --   --  0.98 0.78 0.80 0.75 0.77  CALCIUM  --   --   --  8.0* 8.1* 8.0* 8.3* 7.9*  AST  --   --   --  44* 46* 39 29 24  ALT  --   --   --  191* 148* 112* 83* 62*  ALKPHOS  --   --   --  175* 147* 147* 134* 143*  BILITOT  --   --   --  1.8* 1.0 1.5* 1.0 1.1  ALBUMIN  --   --   --  2.7* 2.3* 2.3* 2.3* 2.3*  MG  --   --   --   --  1.9 1.9 2.1 2.1  PROCALCITON  --  6.01   < > 4.68 3.95 3.69 2.15 1.13  LATICACIDVEN 1.3  --   --   --   --   --   --   --   INR  --  1.1  --   --   --   --   --   --    < > = values in this interval not displayed.      Radiology Reports ECHOCARDIOGRAM COMPLETE  Result Date: 05/12/2022    ECHOCARDIOGRAM REPORT   Patient Name:   DREYTON ROESSNER Date of Exam: 05/12/2022 Medical Rec #:  Phyllis Ginger       Height:       71.0 in Accession #:    [id number]      Weight:       211.6 lb Date of Birth:  [birth year]       BSA:  2.160 m Patient Age:    50 years        BP:           128/77 mmHg Patient Gender: M               HR:           114 bpm. Exam Location:  Inpatient Procedure: 2D Echo, Cardiac Doppler and  Color Doppler Indications:    CHF  History:        Patient has no prior history of Echocardiogram examinations.  Sonographer:    Eduard Roux Referring Phys: Effie Shy Shreyan Hinz K [facility] IMPRESSIONS  1. Left ventricular ejection fraction, by estimation, is 60 to 65%. The left ventricle has normal function. The left ventricle has no regional wall motion abnormalities. Left ventricular diastolic parameters are consistent with Grade I diastolic dysfunction (impaired relaxation).  2. Right ventricular systolic function is normal. The right ventricular size is normal.  3. Left atrial size was mildly dilated.  4. The mitral valve is normal in structure. Trivial mitral valve regurgitation. No evidence of mitral stenosis.  5. The aortic valve is normal in structure. Aortic valve regurgitation is not visualized. No aortic stenosis is present.  6. The inferior vena cava is normal in size with greater than 50% respiratory variability, suggesting right atrial pressure of 3 mmHg. FINDINGS  Left Ventricle: Left ventricular ejection fraction, by estimation, is 60 to 65%. The left ventricle has normal function. The left ventricle has no regional wall motion abnormalities. The left ventricular internal cavity size was normal in size. There is  no left ventricular hypertrophy. Left ventricular diastolic parameters are consistent with Grade I diastolic dysfunction (impaired relaxation). Right Ventricle: The right ventricular size is normal. No increase in right ventricular wall thickness. Right ventricular systolic function is normal. Left Atrium: Left atrial size was mildly dilated. Right Atrium: Right atrial size was normal in size. Pericardium: There is no evidence of pericardial effusion. Mitral Valve: The mitral valve is normal in structure. Trivial mitral valve regurgitation. No evidence of mitral valve stenosis. Tricuspid Valve: The tricuspid valve is normal in structure. Tricuspid valve regurgitation is trivial. No evidence of  tricuspid stenosis. Aortic Valve: The aortic valve is normal in structure. Aortic valve regurgitation is not visualized. No aortic stenosis is present. Aortic valve peak gradient measures 10.1 mmHg. Pulmonic Valve: The pulmonic valve was normal in structure. Pulmonic valve regurgitation is not visualized. No evidence of pulmonic stenosis. Aorta: The aortic root is normal in size and structure. Venous: The inferior vena cava is normal in size with greater than 50% respiratory variability, suggesting right atrial pressure of 3 mmHg. IAS/Shunts: No atrial level shunt detected by color flow Doppler.  LEFT VENTRICLE PLAX 2D LVIDd:         5.30 cm   Diastology LVIDs:         3.60 cm   LV e' medial:    5.44 cm/s LV PW:         1.00 cm   LV E/e' medial:  13.4 LV IVS:        1.00 cm   LV e' lateral:   10.70 cm/s LVOT diam:     2.20 cm   LV E/e' lateral: 6.8 LV SV:         84 LV SV Index:   39 LVOT Area:     3.80 cm  RIGHT VENTRICLE          IVC RV Basal diam:  3.00  cm  IVC diam: 1.60 cm TAPSE (M-mode): 2.6 cm LEFT ATRIUM             Index        RIGHT ATRIUM           Index LA diam:        3.70 cm 1.71 cm/m   RA Area:     13.10 cm LA Vol (A2C):   50.0 ml 23.15 ml/m  RA Volume:   30.80 ml  14.26 ml/m LA Vol (A4C):   52.2 ml 24.17 ml/m LA Biplane Vol: 51.2 ml 23.71 ml/m  AORTIC VALVE                 PULMONIC VALVE AV Area (Vmax): 3.25 cm     PV Vmax:       1.04 m/s AV Vmax:        159.00 cm/s  PV Peak grad:  4.3 mmHg AV Peak Grad:   10.1 mmHg LVOT Vmax:      136.00 cm/s LVOT Vmean:     87.200 cm/s LVOT VTI:       0.221 m  AORTA Ao Root diam: 3.30 cm Ao Asc diam:  3.20 cm MITRAL VALVE MV Area (PHT): 5.16 cm     SHUNTS MV Decel Time: 147 msec     Systemic VTI:  0.22 m MV E velocity: 73.10 cm/s   Systemic Diam: 2.20 cm MV A velocity: 104.00 cm/s MV E/A ratio:  0.70 [name] Meresaniel Bensimhon MD Electronically signed by [name] Meresaniel Bensimhon MD Signature Date/Time: 05/12/2022/11:55:59 AM    Final    IR US Guide Bx Asp/Drain  Result  Date: 05/11/2022 INDICATION: 51 year old male with multifocal hepatic abscesses. EXAM: Ultrasound-guided hepatic abscess aspiration MEDICATIONS: The patient is currently admitted to the hospital and receiving intravenous antibiotics. The antibiotics were administered within an appropriate time frame prior to the initiation of the procedure. ANESTHESIA/SEDATION: Fentanyl 100 mcg IV; Versed 2 mg IV Moderate Sedation Time:  10 The patient was continuously monitored during the procedure by the interventional radiology nurse under my direct supervision. COMPLICATIONS: None immediate. PROCEDURE: Informed written consent was obtained from the patient after a thorough discussion of the procedural risks, benefits and alternatives. All questions were addressed. Maximal Sterile Barrier Technique was utilized including caps, mask, sterile gowns, sterile gloves, sterile drape, hand hygiene and skin antiseptic. A timeout was performed prior to the initiation of the procedure. Preprocedure ultrasound evaluation of the right upper quadrant demonstrated multifocal hypoechoic rounded lesions throughout the hepatic parenchyma, most measuring 1 cm or less. An approximately 1.9 cm collection in the right lateral liver was targeted for aspiration. Subdermal Local anesthesia was provided at the planned needle entry site with 1% lidocaine. Under direct ultrasound visualization, additional local anesthetic was administered about the hepatic capsule. Under direct ultrasound visualization, a 19 gauge, 10 cm Yueh needle was advanced to the central aspect of the fluid collection. Aspiration was performed yielding approximately 5 mL of purulence fluid. The sample was sent to the lab for culture. The needle was removed. Postprocedure ultrasound demonstrated no evidence of perihepatic hematoma or other complicating features. A sterile bandage was applied. The patient was returned to the floor in stable condition. IMPRESSION: Technically successful  ultrasound-guided aspiration of hepatic abscess in the right lobe of the liver. Marliss Cootsylan Suttle, MD [facility] Electronically Signed   By: Marliss Cootsylan  Suttle M.D.   On: 05/11/2022 00:31

## 2022-05-15 ENCOUNTER — Other Ambulatory Visit (HOSPITAL_COMMUNITY): Payer: Self-pay

## 2022-05-15 DIAGNOSIS — K750 Abscess of liver: Secondary | ICD-10-CM | POA: Diagnosis not present

## 2022-05-15 LAB — COMPREHENSIVE METABOLIC PANEL
ALT: 69 U/L — ABNORMAL HIGH (ref 0–44)
AST: 39 U/L (ref 15–41)
Albumin: 2.3 g/dL — ABNORMAL LOW (ref 3.5–5.0)
Alkaline Phosphatase: 229 U/L — ABNORMAL HIGH (ref 38–126)
Anion gap: 10 (ref 5–15)
BUN: 8 mg/dL (ref 6–20)
CO2: 22 mmol/L (ref 22–32)
Calcium: 8.1 mg/dL — ABNORMAL LOW (ref 8.9–10.3)
Chloride: 103 mmol/L (ref 98–111)
Creatinine, Ser: 0.85 mg/dL (ref 0.61–1.24)
GFR, Estimated: >60 mL/min (ref >60)
Glucose, Bld: 116 mg/dL — ABNORMAL HIGH (ref 70–99)
Potassium: 3.8 mmol/L (ref 3.5–5.1)
Sodium: 135 mmol/L (ref 135–145)
Total Bilirubin: 1 mg/dL (ref 0.3–1.2)
Total Protein: 6.4 g/dL — ABNORMAL LOW (ref 6.5–8.1)

## 2022-05-15 LAB — CBC WITH DIFFERENTIAL/PLATELET
Abs Immature Granulocytes: 0.36 10*3/uL — ABNORMAL HIGH (ref 0.00–0.07)
Basophils Absolute: 0.1 10*3/uL (ref 0.0–0.1)
Basophils Relative: 1 %
Eosinophils Absolute: 0.1 10*3/uL (ref 0.0–0.5)
Eosinophils Relative: 1 %
HCT: 37.1 % — ABNORMAL LOW (ref 39.0–52.0)
Hemoglobin: 12.4 g/dL — ABNORMAL LOW (ref 13.0–17.0)
Immature Granulocytes: 3 %
Lymphocytes Relative: 10 %
Lymphs Abs: 1.5 10*3/uL (ref 0.7–4.0)
MCH: 30.1 pg (ref 26.0–34.0)
MCHC: 33.4 g/dL (ref 30.0–36.0)
MCV: 90 fL (ref 80.0–100.0)
Monocytes Absolute: 0.9 10*3/uL (ref 0.1–1.0)
Monocytes Relative: 7 %
Neutro Abs: 11.1 10*3/uL — ABNORMAL HIGH (ref 1.7–7.7)
Neutrophils Relative %: 78 %
Platelets: 236 10*3/uL (ref 150–400)
RBC: 4.12 MIL/uL — ABNORMAL LOW (ref 4.22–5.81)
RDW: 13.7 % (ref 11.5–15.5)
WBC: 14.1 10*3/uL — ABNORMAL HIGH (ref 4.0–10.5)
nRBC: 0 % (ref 0.0–0.2)

## 2022-05-15 LAB — AEROBIC/ANAEROBIC CULTURE W GRAM STAIN (SURGICAL/DEEP WOUND): Culture: NO GROWTH

## 2022-05-15 LAB — PROCALCITONIN: Procalcitonin: 0.8 ng/mL

## 2022-05-15 MED ORDER — ACETAMINOPHEN 325 MG PO TABS
650.0000 mg | ORAL_TABLET | Freq: Three times a day (TID) | ORAL | 0 refills | Status: AC | PRN
  Filled 2022-05-15: qty 25, 4d supply, fill #0

## 2022-05-15 MED ORDER — OMEPRAZOLE 20 MG PO CPDR
20.0000 mg | DELAYED_RELEASE_CAPSULE | ORAL | 0 refills | Status: AC
  Filled 2022-05-15 (×2): qty 30, 60d supply, fill #0

## 2022-05-15 MED ORDER — IBUPROFEN 400 MG PO TABS
400.0000 mg | ORAL_TABLET | Freq: Three times a day (TID) | ORAL | 0 refills | Status: AC | PRN
  Filled 2022-05-15: qty 20, 7d supply, fill #0

## 2022-05-15 NOTE — Plan of Care (Signed)

## 2022-05-15 NOTE — Plan of Care (Signed)
  Problem: Fluid Volume: Goal: Hemodynamic stability will improve 05/15/2022 0257 by Caroll Rancher, RN Outcome: Progressing 05/15/2022 0130 by Caroll Rancher, RN Outcome: Progressing   Problem: Clinical Measurements: Goal: Diagnostic test results will improve 05/15/2022 0257 by Caroll Rancher, RN Outcome: Progressing 05/15/2022 0130 by Caroll Rancher, RN Outcome: Progressing Goal: Signs and symptoms of infection will decrease 05/15/2022 0257 by Caroll Rancher, RN Outcome: Progressing 05/15/2022 0130 by Caroll Rancher, RN Outcome: Progressing   Problem: Respiratory: Goal: Ability to maintain adequate ventilation will improve 05/15/2022 0257 by Caroll Rancher, RN Outcome: Progressing 05/15/2022 0130 by Caroll Rancher, RN Outcome: Progressing

## 2022-05-15 NOTE — Discharge Summary (Signed)
Samuel Patterson [id number] DOB: 12/27/1970 DOA: 05/09/2022  PCP: Haydee Salter, MD  Admit date: 05/09/2022  Discharge date: 05/15/2022  Admitted From: Home   Disposition:  Home   Recommendations for Outpatient Follow-up:   Follow up with PCP in 1-2 weeks  PCP Please obtain BMP/CBC, 2 view CXR in 1week,  (see Discharge instructions)   PCP Please follow up on the following pending results:    Home Health: None   Equipment/Devices: None  Consultations: GI, CCS, ID Discharge Condition: Stable    CODE STATUS: Full    Diet Recommendation: Heart Healthy     Chief Complaint  Patient presents with   Fever   Vomiting     Brief history of present illness from the day of admission and additional interim summary    51 y.o. male with history h/o GERD, diverticulosis, hypertriglyceridemia presents with persistent fevers over the last few days. Was evaluated by PCP on 6/13, had fever 102.6 F and felt to have viral illness possible, advised to take OTC antipyretics and encourage fluid intake.  Patient reports continued poor oral intake, high fevers with Tmax of 103F yesterday at home along with nausea, vomiting x1 this morning prompting ED visit, further work-up suggest liver abscess.                                                                 Hospital Course   Sepsis due to liver abscess. No clear source, no recent travel, no history of GI issues or gallbladder issues in the past, remarkably pain-free and nontender.  GI, general surgery, ID and IR were consulted, he underwent right lobe abscess aspiration with 5 cc of fluid removed on 05/10/2022 by IR, abscess fluid and blood cultures remain negative, he was treated with IV Unasyn with remarkable improvement, sepsis pathophysiology has resolved, fever curve serially  coming down with downtrending LFTs and procalcitonin levels, completely symptom-free and nontender, discussed with general surgery and ID cleared for discharge home on 3 weeks of oral antibiotic with close outpatient PCP, general surgery and ID follow-up.  So far no clear source identified for his liver abscess, echocardiogram done here unremarkable as were his blood cultures.   2.  Transaminitis.  Due to #1 above.  Trend is improving.   3.  Radiological question of acute appendicitis on CT scan.  Clinically ruled out, also cleared by general surgery.   4.  GERD.  PPI   5.  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   6.  Hypertriglyceridemia.  On fish oil.   7.  Hypokalemia.  Replaced.  Discharge diagnosis     Principal Problem:   Sepsis ([place]) Active Problems:   Narcolepsy without cataplexy   Gastroesophageal reflux disease   Hypertriglyceridemia   Alcohol use   Pyogenic liver abscess  Discharge instructions    Discharge Instructions     Diet - low sodium heart healthy   Complete by: As directed    Discharge instructions   Complete by: As directed    Follow with Primary MD Loyola Mast, MD in 7 days   Get CBC, CMP, Magnesium -  checked next visit within 1 week by Primary MD    Activity: As tolerated with Full fall precautions use walker/cane & assistance as needed  Disposition Home    Diet: Heart Healthy   Special Instructions: If you have smoked or chewed Tobacco  in the last 2 yrs please stop smoking, stop any regular Alcohol  and or any Recreational drug use.  On your next visit with your primary care physician please Get Medicines reviewed and adjusted.  Please request your Prim.MD to go over all Hospital Tests and Procedure/Radiological results at the follow up, please get all Hospital records sent to your Prim MD by signing hospital release before you go home.  If you experience worsening of your admission symptoms, develop shortness of breath, life threatening  emergency, suicidal or homicidal thoughts you must seek medical attention immediately by calling 911 or calling your MD immediately  if symptoms less severe.  You Must read complete instructions/literature along with all the possible adverse reactions/side effects for all the Medicines you take and that have been prescribed to you. Take any new Medicines after you have completely understood and accpet all the possible adverse reactions/side effects.   Increase activity slowly   Complete by: As directed    No wound care   Complete by: As directed        Discharge Medications   Allergies as of 05/15/2022   No Known Allergies      Medication List     TAKE these medications    acetaminophen 325 MG tablet Commonly known as: TYLENOL Take 2 tablets (650 mg total) by mouth every 8 (eight) hours as needed for moderate pain, headache or fever (headache).   amoxicillin-clavulanate 875-125 MG tablet Commonly known as: AUGMENTIN Take 1 tablet by mouth 2 (two) times daily for 21 days.   Fish Oil 1000 MG Caps Take 1,000 mg by mouth daily.   Flonase Sensimist 27.5 MCG/SPRAY nasal spray Generic drug: fluticasone Place 2 sprays into the nose daily.   GLUCOSAMINE CHONDR 500 COMPLEX PO Take 500 mg by mouth daily.   ibuprofen 400 MG tablet Commonly known as: ADVIL Take 1 tablet (400 mg total) by mouth every 8 (eight) hours as needed. Take 400 mg by mouth 1-3 times a day as needed for pain What changed:  medication strength when to take this reasons to take this Another medication with the same name was removed. Continue taking this medication, and follow the directions you see here.   METAMUCIL FIBER PO Take 1 Scoop by mouth daily.   methylphenidate 10 MG tablet Commonly known as: RITALIN Take 1 tablet (10 mg total) by mouth 2 (two) times daily. What changed: when to take this   multivitamin tablet Take 2 tablets by mouth daily.   omeprazole 20 MG tablet Commonly known as:  PRILOSEC OTC Take 1 tablet (20 mg total) by mouth every other day.   SINEX REGULAR NA Place 1 spray into the nose in the morning and at bedtime.   TUMS PO Take 1 tablet by mouth 2 (two) times daily as needed (acid reflux).         Follow-up Information  Loyola Mast, MD. Schedule an appointment as soon as possible for a visit in 1 week(s).   Specialty: Family Medicine Contact information: [address] [place] [address] [phone]         Gardiner Barefoot, MD. Schedule an appointment as soon as possible for a visit in 1 week(s).   Specialty: Infectious Diseases Contact information: [address] [place] [address] [phone]         [facility], [place]. Schedule an appointment as soon as possible for a visit in 1 week(s).   Specialty: General Surgery Contact information: [address] [place] [address] [phone]                Major procedures and Radiology Reports - PLEASE review detailed and final reports thoroughly  -       ECHOCARDIOGRAM COMPLETE  Result Date: 05/12/2022    ECHOCARDIOGRAM REPORT   Patient Name:   REFOEL SCALLION Date of Exam: 05/12/2022 Medical Rec #:  [id number]       Height:       71.0 in Accession #:    [id number]      Weight:       211.6 lb Date of Birth:  [birth year]       BSA:          2.160 m Patient Age:    50 years        BP:           128/77 mmHg Patient Gender: M               HR:           114 bpm. Exam Location:  Inpatient Procedure: 2D Echo, Cardiac Doppler and Color Doppler Indications:    CHF  History:        Patient has no prior history of Echocardiogram examinations.  Sonographer:    Eduard Roux Referring Phys: Effie Shy Hasten Sweitzer [facility] IMPRESSIONS  1. Left ventricular ejection fraction, by estimation, is 60 to 65%. The left ventricle has normal function. The left ventricle has no regional wall motion abnormalities. Left ventricular  diastolic parameters are consistent with Grade I diastolic dysfunction (impaired relaxation).  2. Right ventricular systolic function is normal. The right ventricular size is normal.  3. Left atrial size was mildly dilated.  4. The mitral valve is normal in structure. Trivial mitral valve regurgitation. No evidence of mitral stenosis.  5. The aortic valve is normal in structure. Aortic valve regurgitation is not visualized. No aortic stenosis is present.  6. The inferior vena cava is normal in size with greater than 50% respiratory variability, suggesting right atrial pressure of 3 mmHg. FINDINGS  Left Ventricle: Left ventricular ejection fraction, by estimation, is 60 to 65%. The left ventricle has normal function. The left ventricle has no regional wall motion abnormalities. The left ventricular internal cavity size was normal in size. There is  no left ventricular hypertrophy. Left ventricular diastolic parameters are consistent with Grade I diastolic dysfunction (impaired relaxation). Right Ventricle: The right ventricular size is normal. No increase in right ventricular wall thickness. Right ventricular systolic function is normal. Left Atrium: Left atrial size was mildly dilated. Right Atrium: Right atrial size was normal in size. Pericardium: There is no evidence of pericardial effusion. Mitral Valve: The mitral valve is normal in structure. Trivial mitral valve regurgitation. No evidence of mitral valve stenosis. Tricuspid Valve: The tricuspid valve is normal in  structure. Tricuspid valve regurgitation is trivial. No evidence of tricuspid stenosis. Aortic Valve: The aortic valve is normal in structure. Aortic valve regurgitation is not visualized. No aortic stenosis is present. Aortic valve peak gradient measures 10.1 mmHg. Pulmonic Valve: The pulmonic valve was normal in structure. Pulmonic valve regurgitation is not visualized. No evidence of pulmonic stenosis. Aorta: The aortic root is normal in size and  structure. Venous: The inferior vena cava is normal in size with greater than 50% respiratory variability, suggesting right atrial pressure of 3 mmHg. IAS/Shunts: No atrial level shunt detected by color flow Doppler.  LEFT VENTRICLE PLAX 2D LVIDd:         5.30 cm   Diastology LVIDs:         3.60 cm   LV e' medial:    5.44 cm/s LV PW:         1.00 cm   LV E/e' medial:  13.4 LV IVS:        1.00 cm   LV e' lateral:   10.70 cm/s LVOT diam:     2.20 cm   LV E/e' lateral: 6.8 LV SV:         84 LV SV Index:   39 LVOT Area:     3.80 cm  RIGHT VENTRICLE          IVC RV Basal diam:  3.00 cm  IVC diam: 1.60 cm TAPSE (M-mode): 2.6 cm LEFT ATRIUM             Index        RIGHT ATRIUM           Index LA diam:        3.70 cm 1.71 cm/m   RA Area:     13.10 cm LA Vol (A2C):   50.0 ml 23.15 ml/m  RA Volume:   30.80 ml  14.26 ml/m LA Vol (A4C):   52.2 ml 24.17 ml/m LA Biplane Vol: 51.2 ml 23.71 ml/m  AORTIC VALVE                 PULMONIC VALVE AV Area (Vmax): 3.25 cm     PV Vmax:       1.04 m/s AV Vmax:        159.00 cm/s  PV Peak grad:  4.3 mmHg AV Peak Grad:   10.1 mmHg LVOT Vmax:      136.00 cm/s LVOT Vmean:     87.200 cm/s LVOT VTI:       0.221 m  AORTA Ao Root diam: 3.30 cm Ao Asc diam:  3.20 cm MITRAL VALVE MV Area (PHT): 5.16 cm     SHUNTS MV Decel Time: 147 msec     Systemic VTI:  0.22 m MV E velocity: 73.10 cm/s   Systemic Diam: 2.20 cm MV A velocity: 104.00 cm/s MV E/A ratio:  0.70 Glori Bickers MD Electronically signed by Glori Bickers MD Signature Date/Time: 05/12/2022/11:55:59 AM    Final    IR US Guide Bx Asp/Drain  Result Date: 05/11/2022 INDICATION: 51 year old male with multifocal hepatic abscesses. EXAM: Ultrasound-guided hepatic abscess aspiration MEDICATIONS: The patient is currently admitted to the hospital and receiving intravenous antibiotics. The antibiotics were administered within an appropriate time frame prior to the initiation of the procedure. ANESTHESIA/SEDATION: Fentanyl 100 mcg IV;  Versed 2 mg IV Moderate Sedation Time:  10 The patient was continuously monitored during the procedure by the interventional radiology nurse under my direct supervision. COMPLICATIONS: None immediate. PROCEDURE: Informed written  consent was obtained from the patient after a thorough discussion of the procedural risks, benefits and alternatives. All questions were addressed. Maximal Sterile Barrier Technique was utilized including caps, mask, sterile gowns, sterile gloves, sterile drape, hand hygiene and skin antiseptic. A timeout was performed prior to the initiation of the procedure. Preprocedure ultrasound evaluation of the right upper quadrant demonstrated multifocal hypoechoic rounded lesions throughout the hepatic parenchyma, most measuring 1 cm or less. An approximately 1.9 cm collection in the right lateral liver was targeted for aspiration. Subdermal Local anesthesia was provided at the planned needle entry site with 1% lidocaine. Under direct ultrasound visualization, additional local anesthetic was administered about the hepatic capsule. Under direct ultrasound visualization, a 19 gauge, 10 cm Yueh needle was advanced to the central aspect of the fluid collection. Aspiration was performed yielding approximately 5 mL of purulence fluid. The sample was sent to the lab for culture. The needle was removed. Postprocedure ultrasound demonstrated no evidence of perihepatic hematoma or other complicating features. A sterile bandage was applied. The patient was returned to the floor in stable condition. IMPRESSION: Technically successful ultrasound-guided aspiration of hepatic abscess in the right lobe of the liver. Marliss Coots, MD [facility] Electronically Signed   By: Marliss Coots M.D.   On: 05/11/2022 00:31   MR ABDOMEN W WO CONTRAST  Result Date: 05/10/2022 CLINICAL DATA:  51 year old male with history of fever, nausea and vomiting. Multiple  indeterminate liver lesions noted on CT examination. Follow-up study. EXAM: MRI ABDOMEN WITHOUT AND WITH CONTRAST TECHNIQUE: Multiplanar multisequence MR imaging of the abdomen was performed both before and after the administration of intravenous contrast. CONTRAST:  42mL GADAVIST GADOBUTROL 1 MMOL/ML IV SOLN COMPARISON:  No prior abdominal MRI. CT the abdomen and pelvis 05/09/2022. FINDINGS: Lower chest: Unremarkable. Hepatobiliary: Scattered throughout the liver there are multiple T1 hypointense, T2 hyperintense lesions which are centrally hypovascular but demonstrate peripheral rim enhancement and several demonstrate surrounding perfusion anomalies. These lesions all restrict diffusion. The largest of these lesions is in segment 6 (axial image 32 of series 4 and coronal image 10 of series 3) measuring 3.9 x 2.5 x 3.0 cm. No intrahepatic biliary ductal dilatation. Gallbladder is unremarkable in appearance. Pancreas: No pancreatic mass. No pancreatic ductal dilatation. No pancreatic or peripancreatic fluid collections or inflammatory changes. Spleen:  Unremarkable. Adrenals/Urinary Tract: Bilateral kidneys and bilateral adrenal glands are normal in appearance. No hydroureteronephrosis in the visualized portions of the abdomen. Stomach/Bowel: Visualized portions are unremarkable. Vascular/Lymphatic: No aneurysm identified in the visualized abdominal vasculature. No lymphadenopathy noted in the abdomen. Other: No significant volume of ascites noted in the visualized portions of the peritoneal cavity. Musculoskeletal: No aggressive appearing osseous lesions are noted in the visualized portions of the skeleton. IMPRESSION: 1. Multiple liver lesions scattered throughout the hepatic parenchyma with imaging characteristics most indicative of hepatic abscesses. Several of these have some large perfusion anomalies associated with them. Repeat abdominal MRI is recommended in 3 months to ensure regression of these findings  following therapy. Electronically Signed   By: Trudie Reed M.D.   On: 05/10/2022 06:52   CT ABDOMEN PELVIS W CONTRAST  Result Date: 05/09/2022 CLINICAL DATA:  Nausea and vomiting.  Fever. EXAM: CT ABDOMEN AND PELVIS WITH CONTRAST TECHNIQUE: Multidetector CT imaging of the abdomen and pelvis was performed using the standard protocol following bolus administration of intravenous contrast. RADIATION DOSE REDUCTION: This exam was performed according to the departmental dose-optimization program which includes automated exposure control, adjustment  of the mA and/or kV according to patient size and/or use of iterative reconstruction technique. CONTRAST:  162mL OMNIPAQUE IOHEXOL 300 MG/ML  SOLN COMPARISON:  Abdominal ultrasound 05/09/2022 FINDINGS: Lower chest: Dependent subsegmental atelectasis in both lower lobes. Hepatobiliary: Suspected hepatic steatosis. Scattered hypodense renal lesions are present, these have greater than fluid density and some have marginal irregularity or nodularity such as the hypodense lesion on image 21 of series 3 which measures 2.4 by 1.5 cm, and the hypodense lesion on image 32 of series 3 which measures 3.9 by 2.5 cm. Accordingly these lesions are nonspecific, hepatic protocol MRI with and without contrast is once again recommended for definitive characterization. The possibility of hepatic abscesses is not excluded in this patient with fever although the seem to have less marginal enhancement than I would expect for abscesses. No biliary dilatation. Pancreas: Unremarkable Spleen: Unremarkable Adrenals/Urinary Tract: Unremarkable Stomach/Bowel: The appendix is poorly seen but appears to extend from the cecum on images 49 through 57 of series 7, this structure has a diameter of 1.0 cm reason the possibility of acute appendicitis. The distal margin of the structures obscured by bowel loops. Vascular/Lymphatic: Unremarkable Reproductive: Unremarkable Other: No supplemental  non-categorized findings. Musculoskeletal: Likely physiologic anterior wedging at T11. IMPRESSION: 1. Multiple complex hepatic lesions, measuring higher than fluid density, some with somewhat irregular or nodular margins. Hepatic abscesses are a possibility in this patient with fever. Other types of mass cannot be excluded. Metastatic disease to the liver is not completely excluded. Recommend hepatic protocol MRI with and without contrast. 2. A retrocecal tubular structure thought to probably represent appendix is thickened at 1.0 cm in diameter. The distal margin of this structure is obscured by adjacent loops of bowel. The appearance raises suspicion for potential acute appendicitis. Correlate with right lower quadrant tenderness or other characteristic physical exam findings. 3. Hepatic steatosis. Electronically Signed   By: Van Clines M.D.   On: 05/09/2022 11:57   US Abdomen Limited RUQ (LIVER/GB)  Result Date: 05/09/2022 CLINICAL DATA:  Provided history: Pain. Jaundice. EXAM: ULTRASOUND ABDOMEN LIMITED RIGHT UPPER QUADRANT COMPARISON:  No pertinent prior exams available for comparison. FINDINGS: Gallbladder: No gallstones or wall thickening visualized. No sonographic Murphy sign noted by sonographer. Common bile duct: Diameter: 2-3 mm, within normal limits. Liver: There are multiple round to ovoid hypoechoic lesions within the right hepatic lobe, measuring 2.4 x 2.9 x 1.8 cm, 1.5 x 2.1 x 1.4 cm, 2.5 x 1.6 x 2.3 cm and 1.5 x 1.3 x 1.6 cm. There is background increased hepatic parenchymal echogenicity. Portal vein is patent on color Doppler imaging with normal direction of blood flow towards the liver. Impression #1 will be called to the ordering clinician or representative by the Radiologist Assistant, and communication documented in the PACS or [employer]. IMPRESSION: 1. Multiple round to ovoid hypoechoic lesions within the right hepatic lobe, measuring up to 2.4 x 2.9 x 1.8 cm. These lesions  may reflect cysts and/or masses. A liver protocol abdominal MRI without and with contrast is recommended for further evaluation. 2. Background increased hepatic parenchymal echogenicity. This is a nonspecific finding, which may be seen in the setting of hepatic steatosis or other chronic hepatic parenchymal disease. 3. Otherwise unremarkable right upper quadrant ultrasound, as described. Electronically Signed   By: Kellie Simmering D.O.   On: 05/09/2022 10:33   DG Chest 2 View  Result Date: 05/09/2022 CLINICAL DATA:  A 51 year old male presents with fever. EXAM: CHEST - 2 VIEW COMPARISON:  None Available.  FINDINGS: Trachea midline. Cardiomediastinal contours and hilar structures are normal. No sign of pleural effusion. On limited assessment no acute skeletal findings. There is a fracture of the distal third of the LEFT clavicle which may be subacute or chronic. IMPRESSION: 1. No acute cardiopulmonary disease. 2. Fracture of the distal third of the LEFT clavicle which may be subacute or chronic. Correlate with any pain in this area with patient history. Electronically Signed   By: Zetta Bills M.D.   On: 05/09/2022 09:23       Today   Subjective    Samuel Patterson today has no headache,no chest abdominal pain,no new weakness tingling or numbness, feels much better wants to go home today.     Objective   Blood pressure 127/78, pulse 89, temperature 99.3 F (37.4 C), temperature source Oral, resp. rate 17, height 5\' 11"  (1.803 m), weight 96 kg, SpO2 97 %.  No intake or output data in the 24 hours ending 05/15/22 0835  Exam  Awake Alert, No new F.N deficits,    .AT,PERRAL Supple Neck,   Symmetrical Chest wall movement, Good air movement bilaterally, CTAB RRR,No Gallops,   +ve B.Sounds, Abd Soft, Non tender,  No Cyanosis, Clubbing or edema    Data Review   Recent Labs  Lab 05/11/22 0008 05/12/22 0432 05/13/22 0435 05/14/22 0342 05/15/22 0033  WBC 12.3* 16.4* 14.1* 14.1* 14.1*  HGB  11.2* 11.5* 12.2* 11.8* 12.4*  HCT 32.9* 34.0* 36.3* 35.5* 37.1*  PLT 125* 121* 155 204 236  MCV 89.2 89.5 88.8 89.2 90.0  MCH 30.4 30.3 29.8 29.6 30.1  MCHC 34.0 33.8 33.6 33.2 33.4  RDW 13.2 13.3 13.5 13.5 13.7  LYMPHSABS 0.8 0.7 1.2 1.2 1.5  MONOABS 1.2* 1.3* 1.2* 1.2* 0.9  EOSABS 0.0 0.0 0.1 0.1 0.1  BASOSABS 0.0 0.1 0.1 0.1 0.1    Recent Labs  Lab 05/09/22 1609 05/09/22 1636 05/10/22 0033 05/11/22 0008 05/12/22 0432 05/13/22 0435 05/14/22 0342 05/15/22 0033  NA  --   --    < > 135 132* 138 134* 135  K  --   --    < > 3.5 3.2* 3.9 3.1* 3.8  CL  --   --    < > 107 101 101 103 103  CO2  --   --    < > 23 23 25 22 22   GLUCOSE  --   --    < > 119* 128* 113* 123* 116*  BUN  --   --    < > 8 8 7 6 8   CREATININE  --   --    < > 0.78 0.80 0.75 0.77 0.85  CALCIUM  --   --    < > 8.1* 8.0* 8.3* 7.9* 8.1*  AST  --   --    < > 46* 39 29 24 39  ALT  --   --    < > 148* 112* 83* 62* 69*  ALKPHOS  --   --    < > 147* 147* 134* 143* 229*  BILITOT  --   --    < > 1.0 1.5* 1.0 1.1 1.0  ALBUMIN  --   --    < > 2.3* 2.3* 2.3* 2.3* 2.3*  MG  --   --   --  1.9 1.9 2.1 2.1  --   PROCALCITON  --  6.01   < > 3.95 3.69 2.15 1.13 0.80  LATICACIDVEN 1.3  --   --   --   --   --   --   --  INR  --  1.1  --   --   --   --   --   --    < > = values in this interval not displayed.    Total Time in preparing paper work, data evaluation and todays exam - 52 minutes  Lala Lund M.D on 05/15/2022 at 8:35 AM  [facility]

## 2022-05-15 NOTE — Discharge Instructions (Signed)
Follow with Primary MD Loyola Mast, MD in 7 days   Get CBC, CMP, Magnesium -  checked next visit within 1 week by Primary MD    Activity: As tolerated with Full fall precautions use walker/cane & assistance as needed  Disposition Home    Diet: Heart Healthy   Special Instructions: If you have smoked or chewed Tobacco  in the last 2 yrs please stop smoking, stop any regular Alcohol  and or any Recreational drug use.  On your next visit with your primary care physician please Get Medicines reviewed and adjusted.  Please request your Prim.MD to go over all Hospital Tests and Procedure/Radiological results at the follow up, please get all Hospital records sent to your Prim MD by signing hospital release before you go home.  If you experience worsening of your admission symptoms, develop shortness of breath, life threatening emergency, suicidal or homicidal thoughts you must seek medical attention immediately by calling 911 or calling your MD immediately  if symptoms less severe.  You Must read complete instructions/literature along with all the possible adverse reactions/side effects for all the Medicines you take and that have been prescribed to you. Take any new Medicines after you have completely understood and accpet all the possible adverse reactions/side effects.

## 2022-05-15 NOTE — Plan of Care (Signed)

## 2022-05-15 NOTE — Progress Notes (Signed)
Patient ID: Samuel Patterson, male   DOB: 23-Dec-1970, 51 y.o.   MRN: [id number] [facility] Progress Note     Subjective: CC-  Not feeling quite as good as yesterday. Having some increased soreness at IR aspiration site - hurts with deep inspiration. Denies n/v. Tolerating diet. BM yesterday. WBC 14.1, TMAX 100.9. transaminases slightly up  Objective: Vital signs in last 24 hours: Temp:  [99.2 F (37.3 C)-100.9 F (38.3 C)] 99.3 F (37.4 C) (06/22 0758) Pulse Rate:  [85-113] 89 (06/22 0758) Resp:  [16-17] 17 (06/22 0758) BP: (121-144)/(78-89) 127/78 (06/22 0758) SpO2:  [92 %-99 %] 97 % (06/22 0758) Last BM Date : 05/09/22  Intake/Output from previous day: No intake/output data recorded. Intake/Output this shift: No intake/output data recorded.  PE: Gen:  Alert, NAD, pleasant Abd: Soft, ND, NT, +BS Psych: A&Ox3  Lab Results:  Recent Labs    05/14/22 0342 05/15/22 0033  WBC 14.1* 14.1*  HGB 11.8* 12.4*  HCT 35.5* 37.1*  PLT 204 236   BMET Recent Labs    05/14/22 0342 05/15/22 0033  NA 134* 135  K 3.1* 3.8  CL 103 103  CO2 22 22  GLUCOSE 123* 116*  BUN 6 8  CREATININE 0.77 0.85  CALCIUM 7.9* 8.1*   PT/INR No results for input(s): "LABPROT", "INR" in the last 72 hours. CMP     Component Value Date/Time   NA 135 05/15/2022 0033   K 3.8 05/15/2022 0033   CL 103 05/15/2022 0033   CO2 22 05/15/2022 0033   GLUCOSE 116 (H) 05/15/2022 0033   BUN 8 05/15/2022 0033   CREATININE 0.85 05/15/2022 0033   CALCIUM 8.1 (L) 05/15/2022 0033   PROT 6.4 (L) 05/15/2022 0033   ALBUMIN 2.3 (L) 05/15/2022 0033   AST 39 05/15/2022 0033   ALT 69 (H) 05/15/2022 0033   ALKPHOS 229 (H) 05/15/2022 0033   BILITOT 1.0 05/15/2022 0033   GFRNONAA >60 05/15/2022 0033   Lipase     Component Value Date/Time   LIPASE 23 05/09/2022 0556       Studies/Results: No results found.  Anti-infectives: Anti-infectives (From admission, onward)    Start     Dose/Rate  Route Frequency Ordered Stop   05/13/22 0000  amoxicillin-clavulanate (AUGMENTIN) 875-125 MG tablet        1 tablet Oral 2 times daily 05/13/22 1326 06/03/22 2359   05/12/22 1200  Ampicillin-Sulbactam (UNASYN) 3 g in sodium chloride 0.9 % 100 mL IVPB        3 g 200 mL/hr over 30 Minutes Intravenous Every 6 hours 05/12/22 1023     05/10/22 1400  cefTRIAXone (ROCEPHIN) 2 g in sodium chloride 0.9 % 100 mL IVPB  Status:  Discontinued        2 g 200 mL/hr over 30 Minutes Intravenous Every 24 hours 05/09/22 1511 05/12/22 1023   05/09/22 1245  metroNIDAZOLE (FLAGYL) IVPB 500 mg  Status:  Discontinued        500 mg 100 mL/hr over 60 Minutes Intravenous Every 12 hours 05/09/22 1230 05/12/22 1023   05/09/22 1245  cefTRIAXone (ROCEPHIN) 2 g in sodium chloride 0.9 % 100 mL IVPB        2 g 200 mL/hr over 30 Minutes Intravenous  Once 05/09/22 1230 05/09/22 1550        Assessment/Plan Hepatic Lesions concerning for Hepatic Abscesses  Fever  Leukocytosis  Elevated LFT's  Possible Acute Appendicitis  - MRCP with multiple liver lesions most indicative  of hepatic abscesses. No travel outside of the country or recent travel. No IVDU. Hep and HIV panel neg. Unclear etiology. ECHO 6/19 ok, further work up per primary team - S/p IR aspiration with purulent fluid obtained. Cx with NGTD, report pending.  - Continue abx per ID - CT A/P had concerns for appendicitis. His hx and exam are not c/w this. No indication for emergency surgery. He is already on abx to cover hepatic abscesses which should also cover for this  - WBC is stable and fever curve overall improved (TMAX 100.9), but patient is not feeling quite as good as yesterday. Continue antibiotics. Upon discharge patient will follow up with ID with repeat CT scan. We also recommend GI follow up. I will arrange follow up in our office.   FEN - soft diet, IVF per [facility] VTE -SCDs, okay for chemical prophylaxis from a general surgery standpoint ID -  rocephin/flagyl 6/16>>6/19, unasyn 6/19>>   - Per [facility] -  Hypokalemia  Hyperglycemia  ? L clavicle fx    I reviewed hospitalist notes, last 24 h vitals and pain scores, last 48 h intake and output, last 24 h labs and trends, and last 24 h imaging results.    LOS: 5 days    Franne Forts, [facility] 05/15/2022, 8:29 AM Please see Amion for pager number during day hours 7:00am-4:30pm

## 2022-05-19 ENCOUNTER — Ambulatory Visit (INDEPENDENT_AMBULATORY_CARE_PROVIDER_SITE_OTHER): Payer: BC Managed Care – PPO | Admitting: Family Medicine

## 2022-05-19 ENCOUNTER — Encounter: Payer: Self-pay | Admitting: Family Medicine

## 2022-05-19 VITALS — BP 114/68 | HR 94 | Temp 97.1°F | Ht 71.0 in | Wt 202.8 lb

## 2022-05-19 DIAGNOSIS — K750 Abscess of liver: Secondary | ICD-10-CM | POA: Diagnosis not present

## 2022-05-19 DIAGNOSIS — Z8619 Personal history of other infectious and parasitic diseases: Secondary | ICD-10-CM

## 2022-05-19 LAB — COMPREHENSIVE METABOLIC PANEL
ALT: 61 U/L — ABNORMAL HIGH (ref 0–53)
AST: 29 U/L (ref 0–37)
Albumin: 3.6 g/dL (ref 3.5–5.2)
Alkaline Phosphatase: 312 U/L — ABNORMAL HIGH (ref 39–117)
BUN: 13 mg/dL (ref 6–23)
CO2: 26 mEq/L (ref 19–32)
Calcium: 9.4 mg/dL (ref 8.4–10.5)
Chloride: 98 mEq/L (ref 96–112)
Creatinine, Ser: 0.86 mg/dL (ref 0.40–1.50)
GFR: 101.53 mL/min (ref >60.00)
Glucose, Bld: 97 mg/dL (ref 70–99)
Potassium: 5.1 mEq/L (ref 3.5–5.1)
Sodium: 133 mEq/L — ABNORMAL LOW (ref 135–145)
Total Bilirubin: 0.8 mg/dL (ref 0.2–1.2)
Total Protein: 7.9 g/dL (ref 6.0–8.3)

## 2022-05-19 LAB — CBC WITH DIFFERENTIAL/PLATELET
Basophils Absolute: 0.1 10*3/uL (ref 0.0–0.1)
Basophils Relative: 0.5 % (ref 0.0–3.0)
Eosinophils Absolute: 0.1 10*3/uL (ref 0.0–0.7)
Eosinophils Relative: 0.5 % (ref 0.0–5.0)
HCT: 37.2 % — ABNORMAL LOW (ref 39.0–52.0)
Hemoglobin: 12.5 g/dL — ABNORMAL LOW (ref 13.0–17.0)
Lymphocytes Relative: 9.9 % — ABNORMAL LOW (ref 12.0–46.0)
Lymphs Abs: 1.1 10*3/uL (ref 0.7–4.0)
MCHC: 33.7 g/dL (ref 30.0–36.0)
MCV: 91.2 fl (ref 78.0–100.0)
Monocytes Absolute: 0.8 10*3/uL (ref 0.1–1.0)
Monocytes Relative: 7.7 % (ref 3.0–12.0)
Neutro Abs: 8.8 10*3/uL — ABNORMAL HIGH (ref 1.4–7.7)
Neutrophils Relative %: 81.4 % — ABNORMAL HIGH (ref 43.0–77.0)
Platelets: 467 10*3/uL — ABNORMAL HIGH (ref 150.0–400.0)
RBC: 4.08 Mil/uL — ABNORMAL LOW (ref 4.22–5.81)
RDW: 13.8 % (ref 11.5–15.5)
WBC: 10.8 10*3/uL — ABNORMAL HIGH (ref 4.0–10.5)

## 2022-06-03 ENCOUNTER — Encounter: Payer: Self-pay | Admitting: Internal Medicine

## 2022-06-03 ENCOUNTER — Ambulatory Visit: Payer: BC Managed Care – PPO | Admitting: Internal Medicine

## 2022-06-03 ENCOUNTER — Other Ambulatory Visit: Payer: Self-pay

## 2022-06-03 VITALS — BP 116/78 | HR 80 | Temp 97.9°F | Ht 71.0 in | Wt 205.0 lb

## 2022-06-03 DIAGNOSIS — K750 Abscess of liver: Secondary | ICD-10-CM | POA: Diagnosis not present

## 2022-06-03 MED ORDER — AMOXICILLIN-POT CLAVULANATE 875-125 MG PO TABS: 1.0000 | ORAL_TABLET | Freq: Two times a day (BID) | ORAL | 0 refills | Status: AC

## 2022-06-03 NOTE — Patient Instructions (Signed)
Please continue amoxicillin-clavulonate antibiotics for 4 more weeks   Labs today  Ct scan ordered hopefully we can get done in 1-2 weeks  Once the ct is available will see if we can stop abx early   Please make a phone appointment with me or with dr comer in around 3 weeks so we can discuss result of ct/plan

## 2022-06-03 NOTE — Progress Notes (Unsigned)
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   MRN: [id number]  Chief Complaint  Patient presents with   Hospitalization Follow-up    HPI:  Samuel Patterson is a 51 y.o. male with normal screening colonoscopy 11/2021, recent admission 04/2022 for hepatic abscesses here for f/u  Blood cx and aspirate cx both negative Patient improved on empiric abx and discharged on amox-clav  A tte was also not suggestive of endocarditis  He is much better but still have stamina issue  No n/v/diarrhea/rash No abd pain    No Known Allergies    Outpatient Medications Prior to Visit  Medication Sig Dispense Refill   acetaminophen (TYLENOL) 325 MG tablet Take 2 tablets (650 mg total) by mouth every 8 (eight) hours as needed for moderate pain, headache or fever (headache). 25 tablet 0   amoxicillin-clavulanate (AUGMENTIN) 875-125 MG tablet Take 1 tablet by mouth 2 (two) times daily for 21 days. 42 tablet 0   Calcium Carbonate Antacid (TUMS PO) Take 1 tablet by mouth 2 (two) times daily as needed (acid reflux).     fluticasone (FLONASE SENSIMIST) 27.5 MCG/SPRAY nasal spray Place 2 sprays into the nose daily. 10 g 12   Glucosamine-Chondroit-Vit C-Mn (GLUCOSAMINE CHONDR 500 COMPLEX PO) Take 500 mg by mouth daily.     ibuprofen (ADVIL) 400 MG tablet Take 1 tablet (400 mg total) by mouth every 8 (eight) hours as needed. Take 400 mg by mouth 1-3 times a day as needed for pain 20 tablet 0   METAMUCIL FIBER PO Take 1 Scoop by mouth daily.     methylphenidate (RITALIN) 10 MG tablet Take 1 tablet (10  mg total) by mouth 2 (two) times daily. (Patient taking differently: Take 10 mg by mouth daily.) 60 tablet 0   Multiple Vitamin (MULTIVITAMIN) tablet Take 2 tablets by mouth daily.     Omega-3 Fatty Acids (FISH OIL) 1000 MG CAPS Take 1,000 mg by mouth daily.     omeprazole (PRILOSEC) 20 MG capsule Take 1 capsule (20 mg total) by mouth every other day. 30 capsule 0   Phenylephrine HCl (SINEX REGULAR NA) Place 1 spray into the nose in the morning and at bedtime.     No facility-administered medications prior to visit.     Social History   Socioeconomic History   Marital status: Married    Spouse name: Not on file   Number of children: Not on file   Years of education: Not on file   Highest education level: Not on file  Occupational History   Occupation: Technology/phones    Employer: [employer]   Occupation: Visual merchandiser  Tobacco Use   Smoking status: Never   Smokeless tobacco: Never  Vaping Use   Vaping Use: Never used  Substance and Sexual Activity   Alcohol use: Yes    Alcohol/week: 14.0 standard drinks of alcohol    Types: 14 Cans of beer per week    Comment: 2 beers daily   Drug use: No   Sexual activity: Yes  Other Topics  Concern   Not on file  Social History Narrative   Not on file   Social Determinants of Health   Financial Resource Strain: Not on file  Food Insecurity: Not on file  Transportation Needs: Not on file  Physical Activity: Not on file  Stress: Not on file  Social Connections: Not on file  Intimate Partner Violence: Not on file      Review of Systems     Objective:    BP 116/78   Pulse 80   Temp 97.9 F (36.6 C) (Oral)   Ht 5\' 11"  (1.803 m)   Wt 205 lb (93 kg)   BMI 28.59 kg/m  Nursing note and vital signs reviewed.  Physical Exam      General/constitutional: no distress, pleasant HEENT: Normocephalic, PER, Conj Clear, EOMI, Oropharynx clear Neck supple CV: rrr no mrg Lungs: clear to auscultation, normal respiratory  effort Abd: Soft, Nontender Ext: no edema Skin: No Rash Neuro: nonfocal MSK: no peripheral joint swelling/tenderness/warmth; back spines nontender    Labs: Lab Results  Component Value Date   WBC 10.8 (H) 05/19/2022   HGB 12.5 (L) 05/19/2022   HCT 37.2 (L) 05/19/2022   MCV 91.2 05/19/2022   PLT 467.0 (H) 05/19/2022   Last metabolic panel Lab Results  Component Value Date   GLUCOSE 97 05/19/2022   NA 133 (L) 05/19/2022   K 5.1 05/19/2022   CL 98 05/19/2022   CO2 26 05/19/2022   BUN 13 05/19/2022   CREATININE 0.86 05/19/2022   GFRNONAA >60 05/15/2022   CALCIUM 9.4 05/19/2022   PROT 7.9 05/19/2022   ALBUMIN 3.6 05/19/2022   BILITOT 0.8 05/19/2022   ALKPHOS 312 (H) 05/19/2022   AST 29 05/19/2022   ALT 61 (H) 05/19/2022   ANIONGAP 10 05/15/2022   No results found for: "CRP"  Micro:  Serology:  Imaging:  Assessment & Plan:   Problem List Items Addressed This Visit   None Visit Diagnoses     Liver abscess    -  Primary   Relevant Orders   CBC   COMPLETE METABOLIC PANEL WITH GFR   C-reactive protein   CT ABDOMEN PELVIS W CONTRAST         No orders of the defined types were placed in this encounter.    Unclear source of liver abscesses which are improving on typical spectrum gi flora abx. 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      08/24/2022, MD [facility] 06/03/2022, 2:29 PM

## 2022-06-04 LAB — C-REACTIVE PROTEIN: CRP: 3.8 mg/L (ref <8.0)

## 2022-06-04 LAB — COMPLETE METABOLIC PANEL WITH GFR
AG Ratio: 1.2 (calc) (ref 1.0–2.5)
ALT: 24 U/L (ref 9–46)
AST: 17 U/L (ref 10–40)
Albumin: 4 g/dL (ref 3.6–5.1)
Alkaline phosphatase (APISO): 121 U/L (ref 36–130)
BUN: 11 mg/dL (ref 7–25)
CO2: 26 mmol/L (ref 20–32)
Calcium: 9.2 mg/dL (ref 8.6–10.3)
Chloride: 104 mmol/L (ref 98–110)
Creat: 0.88 mg/dL (ref 0.60–1.29)
Globulin: 3.3 g/dL (calc) (ref 1.9–3.7)
Glucose, Bld: 75 mg/dL (ref 65–99)
Potassium: 4.4 mmol/L (ref 3.5–5.3)
Sodium: 139 mmol/L (ref 135–146)
Total Bilirubin: 0.4 mg/dL (ref 0.2–1.2)
Total Protein: 7.3 g/dL (ref 6.1–8.1)
eGFR: 105 mL/min/{1.73_m2} (ref >=60)

## 2022-06-04 LAB — CBC
HCT: 40.1 % (ref 38.5–50.0)
Hemoglobin: 13.4 g/dL (ref 13.2–17.1)
MCH: 30.1 pg (ref 27.0–33.0)
MCHC: 33.4 g/dL (ref 32.0–36.0)
MCV: 90.1 fL (ref 80.0–100.0)
MPV: 10.8 fL (ref 7.5–12.5)
Platelets: 264 10*3/uL (ref 140–400)
RBC: 4.45 10*6/uL (ref 4.20–5.80)
RDW: 13.1 % (ref 11.0–15.0)
WBC: 6.5 10*3/uL (ref 3.8–10.8)

## 2022-06-10 ENCOUNTER — Other Ambulatory Visit: Payer: Self-pay | Admitting: Family Medicine

## 2022-06-10 DIAGNOSIS — G47419 Narcolepsy without cataplexy: Secondary | ICD-10-CM

## 2022-06-12 ENCOUNTER — Ambulatory Visit (HOSPITAL_COMMUNITY)
Admission: RE | Admit: 2022-06-12 | Discharge: 2022-06-12 | Disposition: A | Discharge status: Home / Self Care | Payer: BC Managed Care – PPO | Source: Ambulatory Visit | Attending: Internal Medicine | Admitting: Internal Medicine

## 2022-06-12 ENCOUNTER — Other Ambulatory Visit: Payer: Self-pay | Admitting: Family Medicine

## 2022-06-12 DIAGNOSIS — K750 Abscess of liver: Secondary | ICD-10-CM | POA: Insufficient documentation

## 2022-06-12 DIAGNOSIS — G47419 Narcolepsy without cataplexy: Secondary | ICD-10-CM

## 2022-06-12 MED ORDER — METHYLPHENIDATE HCL 10 MG PO TABS: 10.0000 mg | ORAL_TABLET | Freq: Two times a day (BID) | ORAL | 0 refills | Status: DC

## 2022-06-12 MED ORDER — IOHEXOL 300 MG/ML  SOLN
100.0000 mL | Freq: Once | INTRAMUSCULAR | Status: AC | PRN
  Administered 2022-06-12: 100 mL via INTRAVENOUS

## 2022-06-12 NOTE — Telephone Encounter (Signed)
Refill request for  Ritalin 10 mg LR 04/02/22, #60, 0 rf LOV 05/19/22 FOV 06/16/22  Please review and advise.  Thanks. Dm/cma

## 2022-06-13 ENCOUNTER — Telehealth: Payer: Self-pay

## 2022-06-13 NOTE — Telephone Encounter (Signed)
Patient returned call regarding CT results. Did not have any questions. Will continue antibiotics as planned and follow up later this month. Samuel Patterson, RMA

## 2022-06-13 NOTE — Telephone Encounter (Signed)
Hi team   Please let him know  His ct looks like the liver abscess almost resolves... he can finish his planned course of abx. I saw him on 7/11 and asked him to take 4 more weeks of abx -- plan will remain the same   thanks    Attempted to call patient regarding the following message from provider. Not able to reach him at this time. Left voicemail requesting call back. Did not disclose results in message. Will send mychart message. Juanita Laster, RMA

## 2022-06-16 ENCOUNTER — Encounter: Payer: Self-pay | Admitting: Family Medicine

## 2022-06-16 ENCOUNTER — Ambulatory Visit (INDEPENDENT_AMBULATORY_CARE_PROVIDER_SITE_OTHER): Payer: BC Managed Care – PPO | Admitting: Family Medicine

## 2022-06-16 VITALS — BP 128/90 | HR 92 | Temp 97.8°F | Ht 71.0 in | Wt 206.8 lb

## 2022-06-16 DIAGNOSIS — K5790 Diverticulosis of intestine, part unspecified, without perforation or abscess without bleeding: Secondary | ICD-10-CM | POA: Diagnosis not present

## 2022-06-16 DIAGNOSIS — K750 Abscess of liver: Secondary | ICD-10-CM | POA: Diagnosis not present

## 2022-06-16 MED ORDER — DOCUSATE SODIUM 100 MG PO CAPS: 100.0000 mg | ORAL_CAPSULE | Freq: Two times a day (BID) | ORAL | 0 refills | Status: DC

## 2022-06-16 NOTE — Progress Notes (Signed)
[facility] [address] [place] [address] Dept: [phone] Dept Fax: [phone]  Office Visit  Subjective:    Patient ID: Samuel Patterson, male    DOB: Jun 30, 1971, 51 y.o..   MRN: [id number]  Chief Complaint  Patient presents with   Follow-up    4 week f/u. Pt would like to know if he needs stool softener. Night sweats X4 weeks.     History of Present Illness:  Patient is in today for follow-up of multiple liver abscesses. He was admitted tot he hospital with sepsis in mid-June. An etiology was never determined. Since his last visit, [name] notes he is no longer running fever, though he does continue to experience some night sweats. He notes his endurance is improved and feels he is eating normally. He was seen by Dr. Renold Don (ID) who wants him to continue another month of Augmentin 875-125 mg bid. He also was seen by Dr. Fredricka Bonine (general surgery) to discuss empiric appendectomy. Mr. Robarts is considering not going through with this surgery.  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   Past Surgical History:  Procedure Laterality Date   IR US GUIDE BX ASP/DRAIN  05/10/2022   VASECTOMY     Family History  Problem Relation Age of Onset   Cancer Mother        Breast   Dementia Mother    Heart disease Father    Colon polyps Father    Heart disease Brother 32       MI   Parkinson's disease Maternal Grandfather    Diabetes Paternal Grandmother    Stroke Paternal Grandmother    Heart disease Paternal Grandfather    Colon cancer Neg Hx    Esophageal cancer Neg Hx    Rectal cancer Neg Hx    Stomach cancer Neg Hx     Outpatient Medications Prior to Visit  Medication Sig Dispense Refill   acetaminophen (TYLENOL) 325 MG tablet Take 2 tablets (650 mg total) by mouth every 8 (eight) hours as needed for moderate pain, headache or fever (headache). 25 tablet 0   amoxicillin-clavulanate (AUGMENTIN) 875-125 MG tablet Take 1 tablet by mouth 2 (two) times daily. 60 tablet 0   Calcium Carbonate Antacid (TUMS PO) Take 1 tablet by mouth 2 (two) times daily as needed (acid reflux).     fluticasone (FLONASE SENSIMIST) 27.5 MCG/SPRAY nasal spray Place 2 sprays into the nose daily. 10 g 12   Glucosamine-Chondroit-Vit C-Mn (GLUCOSAMINE CHONDR 500 COMPLEX PO) Take 500 mg by mouth daily.     ibuprofen (ADVIL) 400 MG tablet Take 1 tablet (400 mg total) by mouth every 8 (eight) hours as needed. Take 400 mg by mouth 1-3 times a day as needed for pain 20 tablet 0   METAMUCIL FIBER PO Take 1 Scoop by mouth daily.     methylphenidate (RITALIN) 10 MG tablet Take 1 tablet (10 mg total) by mouth 2 (two) times daily. 60 tablet 0   Multiple Vitamin (MULTIVITAMIN) tablet Take 2 tablets by mouth daily.     Omega-3 Fatty Acids (FISH OIL) 1000 MG CAPS Take 1,000 mg by mouth daily.     omeprazole (PRILOSEC) 20 MG  capsule Take 1 capsule (20 mg total) by mouth every other day. 30 capsule 0   Phenylephrine HCl (SINEX REGULAR NA) Place 1 spray into the nose in the morning and at bedtime.     No facility-administered medications prior to visit.   No Known Allergies    Objective:   Today's Vitals   06/16/22 0825  BP: 128/90  Pulse: 92  Temp: 97.8 F (36.6 C)  TempSrc: Temporal  SpO2: 98%  Weight: 206 lb 12.8 oz (93.8 kg)  Height: 5\' 11"  (1.803 m)   Body mass index is 28.84 kg/m.   General: Well developed, well nourished. No acute distress. Psych: Alert and oriented. Normal mood and affect.  Health Maintenance Due  Topic Date Due   COVID-19 Vaccine (3 - Pfizer risk series) 03/16/2020   Imaging: CT of Abdomen and Pelvis w  contrast (06/12/2022) IMPRESSION: 1. The previous liver abscesses have essentially resolved in the interim. There is slight residual low-density at site of largest lesion, but no definite residual fluid. 2. Background hepatic steatosis. 3. Minimal colonic diverticulosis without diverticulitis.  Lab Results:    Latest Ref Rng & Units 06/03/2022    2:37 PM 05/19/2022   11:27 AM 05/15/2022   12:33 AM  CMP  Glucose 65 - 99 mg/dL 75  97  05/17/2022   BUN 7 - 25 mg/dL 11  13  8    Creatinine 0.60 - 1.29 mg/dL 833   8.25   Sodium 135 - 146 mmol/L 139  133  135   Potassium 3.5 - 5.3 mmol/L 4.4  5.1  3.8   Chloride 98 - 110 mmol/L 104  98  103   CO2 20 - 32 mmol/L 26  26  22    Calcium 8.6 - 10.3 mg/dL 9.2  9.4  8.1   Total Protein 6.1 - 8.1 g/dL 7.3  7.9  6.4   Total Bilirubin 0.2 - 1.2 mg/dL 0.4  0.8  1.0   Alkaline Phos 39 - 117 U/L  312  229   AST 10 - 40 U/L 17  29  39   ALT 9 - 46 U/L 24  61  69       Latest Ref Rng & Units 06/03/2022    2:37 PM 05/19/2022   11:27 AM 05/15/2022   12:33 AM  CBC  WBC 3.8 - 10.8 Thousand/uL 6.5  10.8  14.1   Hemoglobin 13.2 - 17.1 g/dL 08/04/2022  05/21/2022  05/17/2022   Hematocrit 38.5 - 50.0 % 40.1  37.2  37.1   Platelets 140 - 400 Thousand/uL 264  467.0  236    Component Ref Range & Units 13 d ago  CRP <8.0 mg/L 3.8    Assessment & Plan:   1. Liver abscess Improving. Mr. Sisneros will continue to follow with Dr. 19.3 and will continue Augmentin for now. We discussed that declining the empiric appendectomy is not necessarily a bad decision, in light of the lack of confidence that this was the source of his abscesses.  2. Diverticulosis I encouraged him to continue to use a fiber laxative for reducing bowel wall stress. I will provide a stool softener.  - docusate sodium (COLACE) 100 MG capsule; Take 1 capsule (100 mg total) by mouth 2 (two) times daily.  Dispense: 10 capsule; Refill: 0  Return for As scheduled.   79.0, MD

## 2022-06-20 ENCOUNTER — Other Ambulatory Visit: Payer: Self-pay

## 2022-06-20 ENCOUNTER — Ambulatory Visit (INDEPENDENT_AMBULATORY_CARE_PROVIDER_SITE_OTHER): Payer: BC Managed Care – PPO | Admitting: Internal Medicine

## 2022-06-20 DIAGNOSIS — K750 Abscess of liver: Secondary | ICD-10-CM

## 2022-06-20 NOTE — Progress Notes (Signed)
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   MRN: [id number]  Chief Complaint  Patient presents with   Telehealth Consent    HPI:  Samuel Patterson is a 51 y.o. male with normal screening colonoscopy 11/2021, recent admission 04/2022 for hepatic abscesses here for f/u  Blood cx and aspirate cx both negative Patient improved on empiric abx and discharged on amox-clav  A tte was also not suggestive of endocarditis  He is much better but still have stamina issue  No n/v/diarrhea/rash No abd pain  ---- 7/28 clinic f/u Doing well Ct scan reviewed  He is back to 100 %. Eating too well No n/v/abd pain/diarrhea/rash  No Known Allergies    Outpatient Medications Prior to Visit  Medication Sig Dispense Refill   acetaminophen (TYLENOL) 325 MG tablet Take 2 tablets (650 mg total) by mouth every 8 (eight) hours as needed for moderate pain, headache or fever (headache). 25 tablet 0   amoxicillin-clavulanate (AUGMENTIN) 875-125 MG tablet Take 1 tablet by mouth 2 (two) times daily. 60 tablet 0   Calcium Carbonate Antacid (TUMS PO) Take 1 tablet by mouth 2 (two) times daily as needed (acid reflux).     docusate sodium (COLACE) 100 MG capsule Take 1 capsule (100 mg total) by mouth 2 (two) times daily. 10 capsule 0   fluticasone (FLONASE SENSIMIST) 27.5 MCG/SPRAY nasal spray Place 2 sprays into the nose daily. 10 g 12   Glucosamine-Chondroit-Vit C-Mn (GLUCOSAMINE CHONDR 500 COMPLEX PO) Take 500 mg by mouth daily.     ibuprofen (ADVIL) 400 MG tablet Take 1 tablet (400 mg total) by  mouth every 8 (eight) hours as needed. Take 400 mg by mouth 1-3 times a day as needed for pain 20 tablet 0   METAMUCIL FIBER PO Take 1 Scoop by mouth daily.     methylphenidate (RITALIN) 10 MG tablet Take 1 tablet (10 mg total) by mouth 2 (two) times daily. 60 tablet 0   Multiple Vitamin (MULTIVITAMIN) tablet Take 2 tablets by mouth daily.     Omega-3 Fatty Acids (FISH OIL) 1000 MG CAPS Take 1,000 mg by mouth daily.     omeprazole (PRILOSEC) 20 MG capsule Take 1 capsule (20 mg total) by mouth every other day. 30 capsule 0   Phenylephrine HCl (SINEX REGULAR NA) Place 1 spray into the nose in the morning and at bedtime.     No facility-administered medications prior to visit.     Social History   Socioeconomic History   Marital status: Married    Spouse name: Not on file   Number of children: Not on file   Years of education: Not on file   Highest education level: Not on file  Occupational History   Occupation: Technology/phones    Employer: [employer]   Occupation: Visual merchandiser  Tobacco Use   Smoking status: Never   Smokeless tobacco: Never  Vaping Use   Vaping Use: Never used  Substance and Sexual Activity   Alcohol use: Yes    Alcohol/week: 14.0 standard drinks  of alcohol    Types: 14 Cans of beer per week    Comment: 2 beers daily   Drug use: No   Sexual activity: Yes  Other Topics Concern   Not on file  Social History Narrative   Not on file   Social Determinants of Health   Financial Resource Strain: Not on file  Food Insecurity: Not on file  Transportation Needs: Not on file  Physical Activity: Not on file  Stress: Not on file  Social Connections: Not on file  Intimate Partner Violence: Not on file      Review of Systems     Objective:    There were no vitals taken for this visit. Nursing note and vital signs reviewed.  Physical Exam     televisit   Labs: Lab Results  Component Value Date   WBC 6.5 06/03/2022   HGB 13.4 06/03/2022    HCT 40.1 06/03/2022   MCV 90.1 06/03/2022   PLT 264 06/03/2022   Last metabolic panel Lab Results  Component Value Date   GLUCOSE 75 06/03/2022   NA 139 06/03/2022   K 4.4 06/03/2022   CL 104 06/03/2022   CO2 26 06/03/2022   BUN 11 06/03/2022   CREATININE 0.88 06/03/2022   GFRNONAA >60 05/15/2022   CALCIUM 9.2 06/03/2022   PROT 7.3 06/03/2022   ALBUMIN 3.6 05/19/2022   BILITOT 0.4 06/03/2022   ALKPHOS 312 (H) 05/19/2022   AST 17 06/03/2022   ALT 24 06/03/2022   ANIONGAP 10 05/15/2022   Lab Results  Component Value Date   CRP 3.8 06/03/2022    Micro:  Serology:  Imaging: 7/20 ct abd pelv with contrast 1. The previous liver abscesses have essentially resolved in the interim. There is slight residual low-density at site of largest lesion, but no definite residual fluid. 2. Background hepatic steatosis. 3. Minimal colonic diverticulosis without diverticulitis.   Assessment & Plan:   Problem List Items Addressed This Visit   None Visit Diagnoses     Liver abscess    -  Primary        No orders of the defined types were placed in this encounter.    Unclear source of liver abscesses which are improving on typical spectrum gi flora abx. 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 If f/c, n/v, malaise, abd pain occur he should call our office for f/u  Otherwise no more abx today and no need for scheduled follow up   I verified that I was speaking with the correct person using two identifiers. Due to the COVID-19 Pandemic/nature of visit, this service was provided via telemedicine using audio/visual media.     This is a phone visit  The patient was located at home. The provider was located in the office. The patient did consent to this visit and is aware of charges through their insurance as well as the limitations of evaluation and management by telemedicine. Other persons participating in this telemedicine service were none. Time spent on visit was greater than 20 minutes on media and in coordination of care     Follow-up: No follow-ups on file.  Raymondo Band, MD [facility] 06/20/2022, 3:37 PM

## 2022-08-04 ENCOUNTER — Telehealth: Payer: Self-pay

## 2022-08-04 ENCOUNTER — Ambulatory Visit: Payer: BC Managed Care – PPO | Admitting: Family Medicine

## 2022-08-04 ENCOUNTER — Encounter: Payer: Self-pay | Admitting: Family Medicine

## 2022-08-04 VITALS — BP 130/78 | HR 88 | Temp 97.6°F | Ht 71.0 in | Wt 205.2 lb

## 2022-08-04 DIAGNOSIS — G47419 Narcolepsy without cataplexy: Secondary | ICD-10-CM | POA: Diagnosis not present

## 2022-08-04 DIAGNOSIS — K750 Abscess of liver: Secondary | ICD-10-CM

## 2022-08-04 DIAGNOSIS — E781 Pure hyperglyceridemia: Secondary | ICD-10-CM

## 2022-08-04 DIAGNOSIS — Z23 Encounter for immunization: Secondary | ICD-10-CM | POA: Diagnosis not present

## 2022-08-04 LAB — LDL CHOLESTEROL, DIRECT: Direct LDL: 109 mg/dL

## 2022-08-04 LAB — LIPID PANEL
Cholesterol: 195 mg/dL (ref 0–200)
HDL: 44.8 mg/dL (ref >39.00)
NonHDL: 149.76
Total CHOL/HDL Ratio: 4
Triglycerides: 221 mg/dL — ABNORMAL HIGH (ref 0.0–149.0)
VLDL: 44.2 mg/dL — ABNORMAL HIGH (ref 0.0–40.0)

## 2022-08-04 MED ORDER — METHYLPHENIDATE HCL 10 MG PO TABS: 10.0000 mg | ORAL_TABLET | Freq: Two times a day (BID) | ORAL | 0 refills | Status: DC

## 2022-08-04 NOTE — Telephone Encounter (Signed)
PA for Ritalin 10 mg tabs submitted through cover my meds.  Awaiting response.  Dm/cma   Key: [id number]

## 2022-08-04 NOTE — Progress Notes (Signed)
[facility] [address] [place] [address] Dept: [phone] Dept Fax: [phone]  Chronic Care Office Visit  Subjective:    Patient ID: Samuel Patterson, male    DOB: 03-29-1971, 51 y.o..   MRN: [id number]  Chief Complaint  Patient presents with   Follow-up    6 month f/u.  No concerns.    Fasting today.     History of Present Illness:  Patient is in today for reassessment of chronic medical issues.   Mr. Krenn has a history of narcolepsy. This was diagnosed in ~ 2002. He was intially treated with Provigil, but did not respond. He was then treated with Ritalin, which has worked well since. He feels this is working well presently.   Mr. Teutsch has a history of hypertriglyceridemia. He is currently taking fish oil to manage this.  Mr. Zielke was admitted to tot he hospital in [place] with sepsis. He was found to have multiple liver abscesses. He has completed all of his antibiotics and feels he has recovered at this point.  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   Past Surgical History:  Procedure Laterality Date   IR US GUIDE BX ASP/DRAIN  05/10/2022   VASECTOMY     Family History  Problem Relation Age of Onset   Cancer Mother        Breast   Dementia Mother    Heart disease Father    Colon polyps Father    Heart disease Brother 55       MI   Parkinson's disease Maternal Grandfather    Diabetes Paternal Grandmother    Stroke Paternal Grandmother    Heart disease Paternal Grandfather    Colon cancer Neg Hx    Esophageal cancer Neg Hx    Rectal cancer Neg Hx    Stomach cancer Neg Hx    Outpatient  Medications Prior to Visit  Medication Sig Dispense Refill   acetaminophen (TYLENOL) 325 MG tablet Take 2 tablets (650 mg total) by mouth every 8 (eight) hours as needed for moderate pain, headache or fever (headache). 25 tablet 0   Calcium Carbonate Antacid (TUMS PO) Take 1 tablet by mouth 2 (two) times daily as needed (acid reflux).     fluticasone (FLONASE SENSIMIST) 27.5 MCG/SPRAY nasal spray Place 2 sprays into the nose daily. 10 g 12   Glucosamine-Chondroit-Vit C-Mn (GLUCOSAMINE CHONDR 500 COMPLEX PO) Take 500 mg by mouth daily.     ibuprofen (ADVIL) 400 MG tablet Take 1 tablet (400 mg total) by mouth every 8 (eight) hours as needed. Take 400 mg by mouth 1-3 times a day as needed for pain 20 tablet 0   METAMUCIL FIBER PO Take 1 Scoop by mouth daily.     Multiple Vitamin (MULTIVITAMIN) tablet Take 2 tablets by mouth daily.     Omega-3 Fatty Acids (FISH OIL) 1000 MG CAPS Take 1,000 mg by mouth daily.     omeprazole (PRILOSEC) 20 MG capsule Take 1 capsule (20 mg total) by mouth every other day. 30 capsule 0   Phenylephrine HCl (SINEX REGULAR NA) Place 1 spray into the nose in the morning and at bedtime.     methylphenidate (  RITALIN) 10 MG tablet Take 1 tablet (10 mg total) by mouth 2 (two) times daily. 60 tablet 0   docusate sodium (COLACE) 100 MG capsule Take 1 capsule (100 mg total) by mouth 2 (two) times daily. (Patient not taking: Reported on 08/04/2022) 10 capsule 0   No facility-administered medications prior to visit.   No Known Allergies    Objective:   Today's Vitals   08/04/22 0906  BP: 130/78  Pulse: 88  Temp: 97.6 F (36.4 C)  TempSrc: Temporal  SpO2: 98%  Weight: 205 lb 3.2 oz (93.1 kg)  Height: 5' 11"  (1.803 m)   Body mass index is 28.62 kg/m.   General: Well developed, well nourished. No acute distress. Psych: Alert and oriented. Normal mood and affect.  Health Maintenance Due  Topic Date Due   INFLUENZA VACCINE  06/24/2022   Lab Results Last metabolic  panel Lab Results  Component Value Date   GLUCOSE 75 06/03/2022   NA 139 06/03/2022   K 4.4 06/03/2022   CL 104 06/03/2022   CO2 26 06/03/2022   BUN 11 06/03/2022   CREATININE 0.88 06/03/2022   EGFR 105 06/03/2022   CALCIUM 9.2 06/03/2022   PROT 7.3 06/03/2022   ALBUMIN 3.6 05/19/2022   BILITOT 0.4 06/03/2022   ALKPHOS 312 (H) 05/19/2022   AST 17 06/03/2022   ALT 24 06/03/2022   ANIONGAP 10 05/15/2022   Imaging: CT Abdomen and Pelvis w contrast ( 06/12/2022) IMPRESSION: 1. The previous liver abscesses have essentially resolved in the interim. There is slight residual low-density at site of largest lesion, but no definite residual fluid. 2. Background hepatic steatosis. 3. Minimal colonic diverticulosis without diverticulitis.    Assessment & Plan:   1. Pyogenic liver abscess Resolved. Although no etiology was ever discovered,  Mr. Black is doing well at this point. No further follow-up needed for this condition.  2. Hypertriglyceridemia I will repeat his lipids, esp. in light of the health issue he had this summer.  - Lipid panel  3. Narcolepsy without cataplexy Stable on Ritalin 10 mg bid.  - methylphenidate (RITALIN) 10 MG tablet; Take 1 tablet (10 mg total) by mouth 2 (two) times daily.  Dispense: 60 tablet; Refill: 0  4. Need for influenza vaccination  - Flu Vaccine QUAD 6+ mos PF IM (Fluarix Quad PF)  Return in about 6 months (around 02/02/2023) for Reassessment.   Haydee Salter, MD

## 2022-08-05 NOTE — Telephone Encounter (Signed)
PA approved from 08/04/22 - 08/04/25. Pharmacy and patinet notified VIA phone. Dm/cma

## 2022-08-07 ENCOUNTER — Other Ambulatory Visit (HOSPITAL_COMMUNITY): Payer: Self-pay

## 2022-09-25 ENCOUNTER — Other Ambulatory Visit: Payer: Self-pay | Admitting: Family Medicine

## 2022-09-25 DIAGNOSIS — G47419 Narcolepsy without cataplexy: Secondary | ICD-10-CM

## 2022-09-25 MED ORDER — METHYLPHENIDATE HCL 10 MG PO TABS: 10.0000 mg | ORAL_TABLET | Freq: Two times a day (BID) | ORAL | 0 refills | Status: DC

## 2022-09-25 NOTE — Telephone Encounter (Signed)
Refill request for  Ritalin 10 mg LR 08/04/22, #60, 0 rf LOV 08/04/22 FOV  none scheduled.   Please review and advise.  Thanks. Dm/cma

## 2022-11-28 ENCOUNTER — Other Ambulatory Visit: Payer: Self-pay | Admitting: Family Medicine

## 2022-11-28 DIAGNOSIS — G47419 Narcolepsy without cataplexy: Secondary | ICD-10-CM

## 2022-11-28 MED ORDER — METHYLPHENIDATE HCL 10 MG PO TABS: 10.0000 mg | ORAL_TABLET | Freq: Two times a day (BID) | ORAL | 0 refills | Status: DC

## 2022-11-28 NOTE — Telephone Encounter (Signed)
Refill request for  Ritalin  10 mg LR  09/25/22, #60, 0 rf LOV 08/04/22 FOV none scheduled.  Please review and advise.   Thanks.  Dm/cma

## 2023-01-12 ENCOUNTER — Other Ambulatory Visit: Payer: Self-pay | Admitting: Family Medicine

## 2023-01-12 DIAGNOSIS — G47419 Narcolepsy without cataplexy: Secondary | ICD-10-CM

## 2023-01-15 ENCOUNTER — Other Ambulatory Visit: Payer: Self-pay | Admitting: Family Medicine

## 2023-01-15 DIAGNOSIS — G47419 Narcolepsy without cataplexy: Secondary | ICD-10-CM

## 2023-01-15 NOTE — Telephone Encounter (Signed)
Refill request for  Ritalin 10 mg  LR 11/28/22, #60, 0 rf LOV 08/04/22  FOV   none scheduled.   Please review and advise.  Thanks. Dm/cma

## 2023-01-15 NOTE — Telephone Encounter (Signed)
Refill request for  Ritalin 10 mg  LR 11/28/22, #60, 0 rf LOV 08/04/22  FOV   none scheduled.    Please review and advise.  Thanks. Dm/cma

## 2023-01-16 MED ORDER — METHYLPHENIDATE HCL 10 MG PO TABS: 10.0000 mg | ORAL_TABLET | Freq: Two times a day (BID) | ORAL | 0 refills | Status: DC

## 2023-02-18 ENCOUNTER — Ambulatory Visit (INDEPENDENT_AMBULATORY_CARE_PROVIDER_SITE_OTHER): Payer: BC Managed Care – PPO | Admitting: Family Medicine

## 2023-02-18 ENCOUNTER — Encounter: Payer: Self-pay | Admitting: Family Medicine

## 2023-02-18 VITALS — BP 122/80 | HR 63 | Temp 97.7°F | Ht 71.0 in | Wt 210.0 lb

## 2023-02-18 DIAGNOSIS — Z0001 Encounter for general adult medical examination with abnormal findings: Secondary | ICD-10-CM

## 2023-02-18 DIAGNOSIS — Z0000 Encounter for general adult medical examination without abnormal findings: Secondary | ICD-10-CM

## 2023-02-18 DIAGNOSIS — K219 Gastro-esophageal reflux disease without esophagitis: Secondary | ICD-10-CM | POA: Diagnosis not present

## 2023-02-18 DIAGNOSIS — G47419 Narcolepsy without cataplexy: Secondary | ICD-10-CM | POA: Diagnosis not present

## 2023-02-18 DIAGNOSIS — M7521 Bicipital tendinitis, right shoulder: Secondary | ICD-10-CM | POA: Diagnosis not present

## 2023-02-18 DIAGNOSIS — K750 Abscess of liver: Secondary | ICD-10-CM | POA: Diagnosis not present

## 2023-02-18 DIAGNOSIS — Z23 Encounter for immunization: Secondary | ICD-10-CM

## 2023-02-18 LAB — COMPREHENSIVE METABOLIC PANEL
ALT: 32 U/L (ref 0–53)
AST: 24 U/L (ref 0–37)
Albumin: 4.6 g/dL (ref 3.5–5.2)
Alkaline Phosphatase: 74 U/L (ref 39–117)
BUN: 15 mg/dL (ref 6–23)
CO2: 30 mEq/L (ref 19–32)
Calcium: 9.6 mg/dL (ref 8.4–10.5)
Chloride: 101 mEq/L (ref 96–112)
Creatinine, Ser: 1.01 mg/dL (ref 0.40–1.50)
GFR: 86.74 mL/min (ref >60.00)
Glucose, Bld: 96 mg/dL (ref 70–99)
Potassium: 4.4 mEq/L (ref 3.5–5.1)
Sodium: 137 mEq/L (ref 135–145)
Total Bilirubin: 0.6 mg/dL (ref 0.2–1.2)
Total Protein: 7.7 g/dL (ref 6.0–8.3)

## 2023-02-18 LAB — CBC
HCT: 45.7 % (ref 39.0–52.0)
Hemoglobin: 15.5 g/dL (ref 13.0–17.0)
MCHC: 34 g/dL (ref 30.0–36.0)
MCV: 90.4 fl (ref 78.0–100.0)
Platelets: 202 10*3/uL (ref 150.0–400.0)
RBC: 5.06 Mil/uL (ref 4.22–5.81)
RDW: 13.5 % (ref 11.5–15.5)
WBC: 5.9 10*3/uL (ref 4.0–10.5)

## 2023-02-18 NOTE — Assessment & Plan Note (Signed)
Although this condition did resolve, an etiology was never determined. I will plan to check a CBC and liver enzymes today to assure no sing of sequelae or recurrence.

## 2023-02-18 NOTE — Assessment & Plan Note (Addendum)
Stable. Continue Prilosec 20 mg qod

## 2023-02-18 NOTE — Assessment & Plan Note (Signed)
Stable. Continue Ritalin 10 mg daily.

## 2023-02-18 NOTE — Assessment & Plan Note (Signed)
Discussed relative rest, icing, and NSAID as needed for inflammation.

## 2023-02-18 NOTE — Progress Notes (Signed)
[facility] [address] [place] [address] Dept: [phone] Dept Fax: [phone]  Annual Physical Visit  Subjective:    Patient ID: Samuel Patterson, male    DOB: Jun 19, 1971, 52 y.o..   MRN: [id number]  Chief Complaint  Patient presents with   Annual Exam    CPE/labs.  Fasting today.   No concerns.      History of Present Illness:  Patient is in today for an annual physical/preventative visit.  Samuel Patterson has a history of narcolepsy. This was diagnosed in ~ 2002. He was intially treated with Provigil, but did not respond. He was then treated with Ritalin, which has worked well since. He feels this is working well presently, though he admits that he does not take this every day.   Samuel Patterson has a history of hypertriglyceridemia. He is currently taking fish oil to manage this.   Samuel Patterson was admitted to to the hospital in mid-June with sepsis. He was found to have multiple liver abscesses. He has not had any recurrent symptoms.  Review of Systems  Constitutional:  Negative for chills, diaphoresis, fever, malaise/fatigue and weight loss.  HENT:  Positive for congestion and tinnitus. Negative for ear pain, hearing loss, sinus pain and sore throat.        Chronic tinnitus. Mild nasal allergy symptoms currently. Manages with Flonase and cetirizine.  Eyes:  Negative for blurred vision, pain, discharge and redness.  Respiratory:  Negative for cough, shortness of breath and wheezing.   Cardiovascular:  Negative for chest pain and palpitations.  Gastrointestinal:  Positive for heartburn. Negative for abdominal pain, constipation, diarrhea, nausea and vomiting.       Manages heartburn with Prilosec every other day.  Musculoskeletal:  Positive for joint pain. Negative for back pain and myalgias.       Mild right shoulder pain. Notes this with certain movements of the forearm, esp.lifting type activities.  Skin:  Negative for  itching and rash.  Psychiatric/Behavioral:  Negative for depression. The patient is not nervous/anxious.    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   Past Surgical History:  Procedure Laterality Date   IR US GUIDE BX ASP/DRAIN  05/10/2022   TUBAL LIGATION  2017   VASECTOMY     Family History  Problem Relation Age of Onset   Cancer Mother        Breast   Dementia Mother    Heart disease Father    Colon polyps Father    Heart disease Brother 5       MI   Parkinson's disease Maternal Grandfather    Diabetes Paternal Grandmother    Stroke Paternal Grandmother    Heart disease Paternal Grandfather    Colon cancer Neg Hx    Esophageal cancer Neg Hx    Rectal cancer Neg Hx    Stomach cancer Neg Hx    Outpatient Medications Prior to Visit  Medication Sig Dispense Refill   acetaminophen (TYLENOL) 325 MG tablet Take 2 tablets (650 mg total) by mouth every 8 (eight) hours as needed for moderate pain, headache or fever (headache). 25 tablet 0  Calcium Carbonate Antacid (TUMS PO) Take 1 tablet by mouth 2 (two) times daily as needed (acid reflux).     fluticasone (FLONASE SENSIMIST) 27.5 MCG/SPRAY nasal spray Place 2 sprays into the nose daily. 10 g 12   Glucosamine-Chondroit-Vit C-Mn (GLUCOSAMINE CHONDR 500 COMPLEX PO) Take 500 mg by mouth daily.     ibuprofen (ADVIL) 400 MG tablet Take 1 tablet (400 mg total) by mouth every 8 (eight) hours as needed. Take 400 mg by mouth 1-3 times a day as needed for pain 20 tablet 0   METAMUCIL FIBER PO Take 1 Scoop by mouth daily.     methylphenidate (RITALIN) 10 MG tablet Take 1 tablet (10 mg total) by  mouth 2 (two) times daily. 60 tablet 0   Multiple Vitamin (MULTIVITAMIN) tablet Take 2 tablets by mouth daily.     Omega-3 Fatty Acids (FISH OIL) 1000 MG CAPS Take 1,000 mg by mouth daily.     omeprazole (PRILOSEC) 20 MG capsule Take 1 capsule (20 mg total) by mouth every other day. 30 capsule 0   Phenylephrine HCl (SINEX REGULAR NA) Place 1 spray into the nose in the morning and at bedtime.     docusate sodium (COLACE) 100 MG capsule Take 1 capsule (100 mg total) by mouth 2 (two) times daily. (Patient not taking: Reported on 08/04/2022) 10 capsule 0   No facility-administered medications prior to visit.   No Known Allergies Objective:   Today's Vitals   02/18/23 0817  BP: 122/80  Pulse: 63  Temp: 97.7 F (36.5 C)  TempSrc: Temporal  SpO2: 97%  Weight: 210 lb (95.3 kg)  Height: 5\' 11"  (1.803 m)   Body mass index is 29.29 kg/m.   General: Well developed, well nourished. No acute distress. HEENT: Normocephalic, non-traumatic. PERRL, EOMI. Conjunctiva clear. External ears normal. EAC and TMs normal   bilaterally. Nose clear without congestion or rhinorrhea. Mucous membranes moist. Mild mucous streaking of posterior   oropharynx. Good dentition. Neck: Supple. No lymphadenopathy. No thyromegaly. Lungs: Clear to auscultation bilaterally. No wheezing, rales or rhonchi. CV: RRR without murmurs or rubs. Pulses 2+ bilaterally. Abdomen: Soft, non-tender. Bowel sounds positive, normal pitch and frequency. No hepatosplenomegaly. No rebound or   guarding. Back: Straight. No CVA tenderness bilaterally. Extremities: Full ROM. No joint swelling. Mild tenderness to palpation over the right anterior shoulder at the insertion of the   short head of the biceps. No edema noted. Psych: Alert and oriented. Normal mood and affect.  Health Maintenance Due  Topic Date Due   Zoster Vaccines- Shingrix (1 of 2) Never done   DTaP/Tdap/Td (3 - Td or Tdap) 09/13/2022     Assessment & Plan:   Problem  List Items Addressed This Visit       Digestive   Gastroesophageal reflux disease    Stable. Continue Prilosec 20 mg qod      Pyogenic liver abscess    Although this condition did resolve, an etiology was never determined. I will plan to check a CBC and liver enzymes today to assure no sing of sequelae or recurrence.      Relevant Orders   Comprehensive metabolic panel   CBC     Musculoskeletal and Integument   Biceps tendinitis of right shoulder    Discussed relative rest, icing, and NSAID as needed for inflammation.        Other   Narcolepsy without cataplexy    Stable. Continue Ritalin 10 mg daily.      Other Visit  Diagnoses     Annual physical exam    -  Primary   Overall good health. Encouraged routine exercise. Reviewed screenings and immunization needs.   Need for tetanus booster       Relevant Orders   Td : Tetanus/diphtheria >7yo Preservative  free (Completed)   Need for shingles vaccine       Relevant Orders   Varicella-zoster vaccine IM (Completed)       Return in about 6 months (around 08/21/2023) for Annual preventative care.   Haydee Salter, MD

## 2023-03-03 ENCOUNTER — Other Ambulatory Visit: Payer: Self-pay | Admitting: Family Medicine

## 2023-03-03 DIAGNOSIS — G47419 Narcolepsy without cataplexy: Secondary | ICD-10-CM

## 2023-03-04 MED ORDER — METHYLPHENIDATE HCL 10 MG PO TABS: 10.0000 mg | ORAL_TABLET | Freq: Two times a day (BID) | ORAL | 0 refills | Status: DC

## 2023-03-04 NOTE — Telephone Encounter (Signed)
Refill request for  Ritalin 10 mg LR  01/16/23,#60, 0 rf LOV 02/18/23 FOV  08/15/23  Please review and advise.  Thanks Dm/cma

## 2023-04-14 ENCOUNTER — Other Ambulatory Visit (HOSPITAL_COMMUNITY): Payer: Self-pay

## 2023-04-17 ENCOUNTER — Other Ambulatory Visit: Payer: Self-pay | Admitting: Family Medicine

## 2023-04-17 DIAGNOSIS — G47419 Narcolepsy without cataplexy: Secondary | ICD-10-CM

## 2023-04-17 MED ORDER — METHYLPHENIDATE HCL 10 MG PO TABS
10.0000 mg | ORAL_TABLET | Freq: Two times a day (BID) | ORAL | 0 refills | Status: DC
Start: 2023-04-17 — End: 2023-06-09

## 2023-04-17 NOTE — Telephone Encounter (Signed)
Refill request for Ritalin 10 mg LR 03/04/23, #60, 0 rf LOV 02/18/23 FOV  08/21/23  Please review and advise.  Thanks. Dm/cma

## 2023-06-09 ENCOUNTER — Other Ambulatory Visit: Payer: Self-pay | Admitting: Family Medicine

## 2023-06-09 DIAGNOSIS — G47419 Narcolepsy without cataplexy: Secondary | ICD-10-CM

## 2023-06-09 MED ORDER — METHYLPHENIDATE HCL 10 MG PO TABS
10.0000 mg | ORAL_TABLET | Freq: Two times a day (BID) | ORAL | 0 refills | Status: DC
Start: 2023-06-09 — End: 2023-07-31

## 2023-06-09 NOTE — Telephone Encounter (Signed)
Refill request for  Ritalin 10 mg  LR  04/17/23, #60, 0 rf LOV  02/12/23 FOV   08/18/23  Please review and advise.  Thanks. Dm/cma

## 2023-07-09 ENCOUNTER — Encounter (INDEPENDENT_AMBULATORY_CARE_PROVIDER_SITE_OTHER): Payer: Self-pay

## 2023-07-31 ENCOUNTER — Other Ambulatory Visit: Payer: Self-pay | Admitting: Family Medicine

## 2023-07-31 DIAGNOSIS — G47419 Narcolepsy without cataplexy: Secondary | ICD-10-CM

## 2023-07-31 MED ORDER — METHYLPHENIDATE HCL 10 MG PO TABS
10.0000 mg | ORAL_TABLET | Freq: Two times a day (BID) | ORAL | 0 refills | Status: AC
Start: 2023-07-31 — End: ?

## 2023-07-31 NOTE — Telephone Encounter (Signed)
Refill request for  Ritalin 10 mg LR  06/09/23, # 60 LOV 02/18/23 FOV  08/15/23  Please review and advise.  Thanks. Dm/cma

## 2023-07-31 NOTE — Telephone Encounter (Signed)
Patient notified VIA phone. Dm/cma  

## 2023-08-21 ENCOUNTER — Ambulatory Visit: Payer: BC Managed Care – PPO | Admitting: Family Medicine

## 2023-08-21 ENCOUNTER — Encounter: Payer: Self-pay | Admitting: Family Medicine

## 2023-08-21 VITALS — BP 122/76 | HR 70 | Temp 98.2°F | Ht 71.0 in | Wt 210.2 lb

## 2023-08-21 DIAGNOSIS — H9313 Tinnitus, bilateral: Secondary | ICD-10-CM | POA: Diagnosis not present

## 2023-08-21 DIAGNOSIS — Z23 Encounter for immunization: Secondary | ICD-10-CM | POA: Diagnosis not present

## 2023-08-21 DIAGNOSIS — G47419 Narcolepsy without cataplexy: Secondary | ICD-10-CM

## 2023-08-21 MED ORDER — METHYLPHENIDATE HCL 10 MG PO TABS
10.0000 mg | ORAL_TABLET | Freq: Two times a day (BID) | ORAL | 0 refills | Status: DC
Start: 2023-08-21 — End: 2023-09-15

## 2023-08-21 NOTE — Addendum Note (Signed)
Addended by: Loyola Mast on: 08/21/2023 11:07 AM   Modules accepted: Orders, Level of Service

## 2023-08-21 NOTE — Progress Notes (Signed)
[facility] [address] [place] [address] Dept: [phone] Dept Fax: [phone]  Chronic Care Office Visit  Subjective:    Patient ID: Samuel Patterson, male    DOB: 23-Dec-1970, 52 y.o..   MRN: [id number]  Chief Complaint  Patient presents with   Follow-up    6 month f/u.  Fasting today. No concerns.  Want flu shot an 2nd shingles vaccine.    History of Present Illness:  Patient is in today for reassessment of chronic medical issues.  Samuel Patterson has a history of narcolepsy. This was diagnosed in ~ 2002. He was intially treated with Provigil, but did not respond. He was then treated with Ritalin, which has worked well since. He feels this is working well presently.  Samuel Patterson notes an issue with intermittent tinnitus, that is becoming more consistently present. 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 He notes he is better now as an adult about protecting his hearing around loud motors. He has not noted hearing loss or vertigo. This may flare around the times he takes ibuprofen for his knee arthritis.  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   Past Surgical History:  Procedure Laterality Date   IR US GUIDE BX ASP/DRAIN  05/10/2022   TUBAL LIGATION  2017   VASECTOMY     Family History  Problem Relation Age of Onset   Cancer Mother        Breast   Dementia Mother    Heart disease Father    Colon polyps Father    Heart disease Brother 85       MI   Parkinson's disease Maternal Grandfather    Diabetes Paternal  Grandmother    Stroke Paternal Grandmother    Heart disease Paternal Grandfather    Colon cancer Neg Hx    Esophageal cancer Neg Hx    Rectal cancer Neg Hx    Stomach cancer Neg Hx    Outpatient Medications Prior to Visit  Medication Sig Dispense Refill   acetaminophen (TYLENOL) 325 MG tablet Take 2 tablets (650 mg total) by mouth every 8 (eight) hours as needed for moderate pain, headache or fever (headache). 25 tablet 0   azelastine (ASTELIN) 0.1 % nasal spray Place into both nostrils 2 (two) times daily. Use in each nostril as directed     Calcium Carbonate Antacid (TUMS PO) Take 1 tablet by mouth 2 (two) times daily as needed (acid reflux).     Glucosamine-Chondroit-Vit C-Mn (GLUCOSAMINE CHONDR 500 COMPLEX PO) Take 500 mg by mouth daily.     ibuprofen (ADVIL) 400 MG tablet Take 1 tablet (400 mg total) by mouth every 8 (eight) hours as needed. Take 400 mg by mouth 1-3 times a day as needed for pain 20 tablet 0   METAMUCIL FIBER PO Take 1 Scoop by mouth daily.     methylphenidate (RITALIN) 10 MG tablet Take 1 tablet (10 mg total) by mouth 2 (two) times daily. 60 tablet 0   Multiple Vitamin (MULTIVITAMIN) tablet Take 2 tablets by mouth daily.     Omega-3 Fatty Acids (FISH  OIL) 1000 MG CAPS Take 1,000 mg by mouth daily.     omeprazole (PRILOSEC) 20 MG capsule Take 1 capsule (20 mg total) by mouth every other day. 30 capsule 0   Phenylephrine HCl (SINEX REGULAR NA) Place 1 spray into the nose in the morning and at bedtime.     fluticasone (FLONASE SENSIMIST) 27.5 MCG/SPRAY nasal spray Place 2 sprays into the nose daily. 10 g 12   No facility-administered medications prior to visit.   No Known Allergies Objective:   Today's Vitals   08/21/23 0753  BP: 122/76  Pulse: 70  Temp: 98.2 F (36.8 C)  TempSrc: Temporal  SpO2: 97%  Weight: 210 lb 3.2 oz (95.3 kg)  Height: 5\' 11"  (1.803 m)   Body mass index is 29.32 kg/m.   General: Well developed, well nourished. No acute  distress. Psych: Alert and oriented. Normal mood and affect.  Health Maintenance Due  Topic Date Due   Zoster Vaccines- Shingrix (2 of 2) 04/15/2023   INFLUENZA VACCINE  06/25/2023     Assessment & Plan:   Problem List Items Addressed This Visit       Other   Narcolepsy without cataplexy - Primary    Stable. Continue Ritalin 10 mg daily.      Relevant Medications   methylphenidate (RITALIN) 10 MG tablet   Tinnitus aurium, bilateral    Discussed hearing protection and limitation of aspirin/NSAID use.       Return in about 6 months (around 02/18/2024) for Reassessment.   Loyola Mast, MD

## 2023-08-21 NOTE — Addendum Note (Signed)
Addended by: Waymond Cera on: 08/21/2023 08:35 AM   Modules accepted: Orders

## 2023-08-21 NOTE — Assessment & Plan Note (Signed)
Discussed hearing protection and limitation of aspirin/NSAID use.

## 2023-08-21 NOTE — Assessment & Plan Note (Signed)
Stable. Continue Ritalin 10 mg daily.

## 2023-09-15 ENCOUNTER — Other Ambulatory Visit: Payer: Self-pay | Admitting: Family Medicine

## 2023-09-15 DIAGNOSIS — G47419 Narcolepsy without cataplexy: Secondary | ICD-10-CM

## 2023-09-15 MED ORDER — METHYLPHENIDATE HCL 10 MG PO TABS
10.0000 mg | ORAL_TABLET | Freq: Two times a day (BID) | ORAL | 0 refills | Status: DC
Start: 2023-09-15 — End: 2023-11-05

## 2023-09-15 NOTE — Telephone Encounter (Signed)
Last Ov and filled 08/21/23

## 2023-11-05 ENCOUNTER — Other Ambulatory Visit: Payer: Self-pay | Admitting: Family Medicine

## 2023-11-05 DIAGNOSIS — G47419 Narcolepsy without cataplexy: Secondary | ICD-10-CM

## 2023-11-05 MED ORDER — METHYLPHENIDATE HCL 10 MG PO TABS
10.0000 mg | ORAL_TABLET | Freq: Two times a day (BID) | ORAL | 0 refills | Status: DC
Start: 2023-11-05 — End: 2023-12-28

## 2023-11-05 NOTE — Telephone Encounter (Signed)
LR 09/15/23, #60, 0 rf LOV  08/21/23 FOV  02/18/24  Please review and advise.  Thanks. Dm/cma

## 2023-11-05 NOTE — Telephone Encounter (Signed)
Left detailed VM that RX was sent to the pharmacy.  Dm/cma

## 2023-12-16 ENCOUNTER — Telehealth: Payer: Self-pay | Admitting: Family Medicine

## 2023-12-16 NOTE — Telephone Encounter (Signed)
 ERROR

## 2023-12-28 ENCOUNTER — Other Ambulatory Visit: Payer: Self-pay | Admitting: Family Medicine

## 2023-12-28 DIAGNOSIS — G47419 Narcolepsy without cataplexy: Secondary | ICD-10-CM

## 2023-12-28 MED ORDER — METHYLPHENIDATE HCL 10 MG PO TABS
10.0000 mg | ORAL_TABLET | Freq: Two times a day (BID) | ORAL | 0 refills | Status: DC
Start: 2023-12-28 — End: 2024-02-10

## 2023-12-28 NOTE — Telephone Encounter (Signed)
Requesting: methylphenidate (RITALIN) 10 MG tablet  Last Visit: 08/21/2023 Next Visit: 02/18/2024 Last Refill: 11/05/2023  Please Advise

## 2024-02-10 ENCOUNTER — Other Ambulatory Visit: Payer: Self-pay | Admitting: Family Medicine

## 2024-02-10 DIAGNOSIS — G47419 Narcolepsy without cataplexy: Secondary | ICD-10-CM

## 2024-02-10 MED ORDER — METHYLPHENIDATE HCL 10 MG PO TABS
10.0000 mg | ORAL_TABLET | Freq: Two times a day (BID) | ORAL | 0 refills | Status: DC
Start: 2024-02-10 — End: 2024-04-20

## 2024-02-10 NOTE — Telephone Encounter (Signed)
 Refill request for  Ritalin 10 mg LR  12/28/23, #60, 0 rf LOV 08/21/23 FOV  02/18/24  Please review and advise.  Thanks.  Dm/cma

## 2024-02-18 ENCOUNTER — Ambulatory Visit: Payer: BC Managed Care – PPO | Admitting: Family Medicine

## 2024-02-18 ENCOUNTER — Ambulatory Visit (INDEPENDENT_AMBULATORY_CARE_PROVIDER_SITE_OTHER): Payer: BC Managed Care – PPO | Admitting: Family Medicine

## 2024-02-18 ENCOUNTER — Encounter: Payer: Self-pay | Admitting: Family Medicine

## 2024-02-18 VITALS — BP 120/78 | HR 80 | Temp 98.0°F | Ht 71.0 in | Wt 210.6 lb

## 2024-02-18 DIAGNOSIS — G47419 Narcolepsy without cataplexy: Secondary | ICD-10-CM | POA: Diagnosis not present

## 2024-02-18 NOTE — Progress Notes (Signed)
 [facility] [address] [place] [address] Dept: [phone] Dept Fax: [phone]  Chronic Care Office Visit  Subjective:    Patient ID: Samuel Patterson, male    DOB: [birth year], 53 y.o..   MRN: [id number]  Chief Complaint  Patient presents with   Follow-up    6 month f/u.  No concerns.    History of Present Illness:  Patient is in today for reassessment of chronic medical issues.  Mr. Vanduyne has a history of narcolepsy. This was diagnosed in ~ 2002. He was intially treated with Provigil, but did not respond. He was then treated with Ritalin, which has worked well since. He feels this is working well presently. He denies any problems with appetite or falling asleep.  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   Past Surgical History:  Procedure Laterality Date   IR US GUIDE BX ASP/DRAIN  05/10/2022   TUBAL LIGATION  2017   VASECTOMY     Family History  Problem Relation Age of Onset   Cancer Mother        Breast   Dementia Mother    Heart disease Father    Colon polyps Father    Heart disease Brother 17       MI   Parkinson's disease Maternal Grandfather    Diabetes Paternal Grandmother    Stroke Paternal Grandmother    Heart disease Paternal Grandfather    Colon cancer Neg Hx    Esophageal cancer Neg Hx    Rectal cancer Neg Hx    Stomach cancer Neg Hx    Outpatient Medications Prior to Visit  Medication Sig Dispense Refill   acetaminophen (TYLENOL) 325 MG tablet Take 2 tablets (650 mg total) by mouth every 8 (eight)  hours as needed for moderate pain, headache or fever (headache). 25 tablet 0   azelastine (ASTELIN) 0.1 % nasal spray Place into both nostrils 2 (two) times daily. Use in each nostril as directed     Calcium Carbonate Antacid (TUMS PO) Take 1 tablet by mouth 2 (two) times daily as needed (acid reflux).     Glucosamine-Chondroit-Vit C-Mn (GLUCOSAMINE CHONDR 500 COMPLEX PO) Take 500 mg by mouth daily.     ibuprofen (ADVIL) 400 MG tablet Take 1 tablet (400 mg total) by mouth every 8 (eight) hours as needed. Take 400 mg by mouth 1-3 times a day as needed for pain 20 tablet 0   METAMUCIL FIBER PO Take 1 Scoop by mouth daily.     methylphenidate (RITALIN) 10 MG tablet Take 1 tablet (10 mg total) by mouth 2 (two) times daily. 60 tablet 0   Multiple Vitamin (MULTIVITAMIN) tablet Take 2 tablets by mouth daily.     Omega-3 Fatty Acids (FISH OIL) 1000 MG CAPS Take 1,000 mg by mouth daily.     omeprazole (PRILOSEC) 20 MG capsule Take 1 capsule (20 mg total) by mouth every other day. 30 capsule 0   Phenylephrine HCl (SINEX REGULAR NA) Place 1 spray into the nose in the morning and at bedtime.     No facility-administered medications prior to visit.  No Known Allergies Objective:   Today's Vitals   02/18/24 0806  BP: 120/78  Pulse: 80  Temp: 98 F (36.7 C)  TempSrc: Temporal  SpO2: 100%  Weight: 210 lb 9.6 oz (95.5 kg)  Height: 5\' 11"  (1.803 m)   Body mass index is 29.37 kg/m.   General: Well developed, well nourished. No acute distress. Psych: Alert and oriented. Normal mood and affect.  There are no preventive care reminders to display for this patient.  Assessment & Plan:   Problem List Items Addressed This Visit       Other   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 Toleratign therapy without concerning side effects. Continue Ritalin 10 mg daily.       Return in about 6 months (around 08/20/2024) for Reassessment.   Loyola Mast, MD

## 2024-02-18 NOTE — Assessment & Plan Note (Signed)
 Stable. Toleratign therapy without concerning side effects. Continue Ritalin 10 mg daily.

## 2024-04-10 IMAGING — US IR US GUIDANCE
1 series · 3 of 3 positions shown · non-contrast
Comparison: none

INDICATION: 50-year-old male with multifocal hepatic abscesses.

[Series 1: ir us guidance · 3 of 3 slices shown]
[im 1/3]
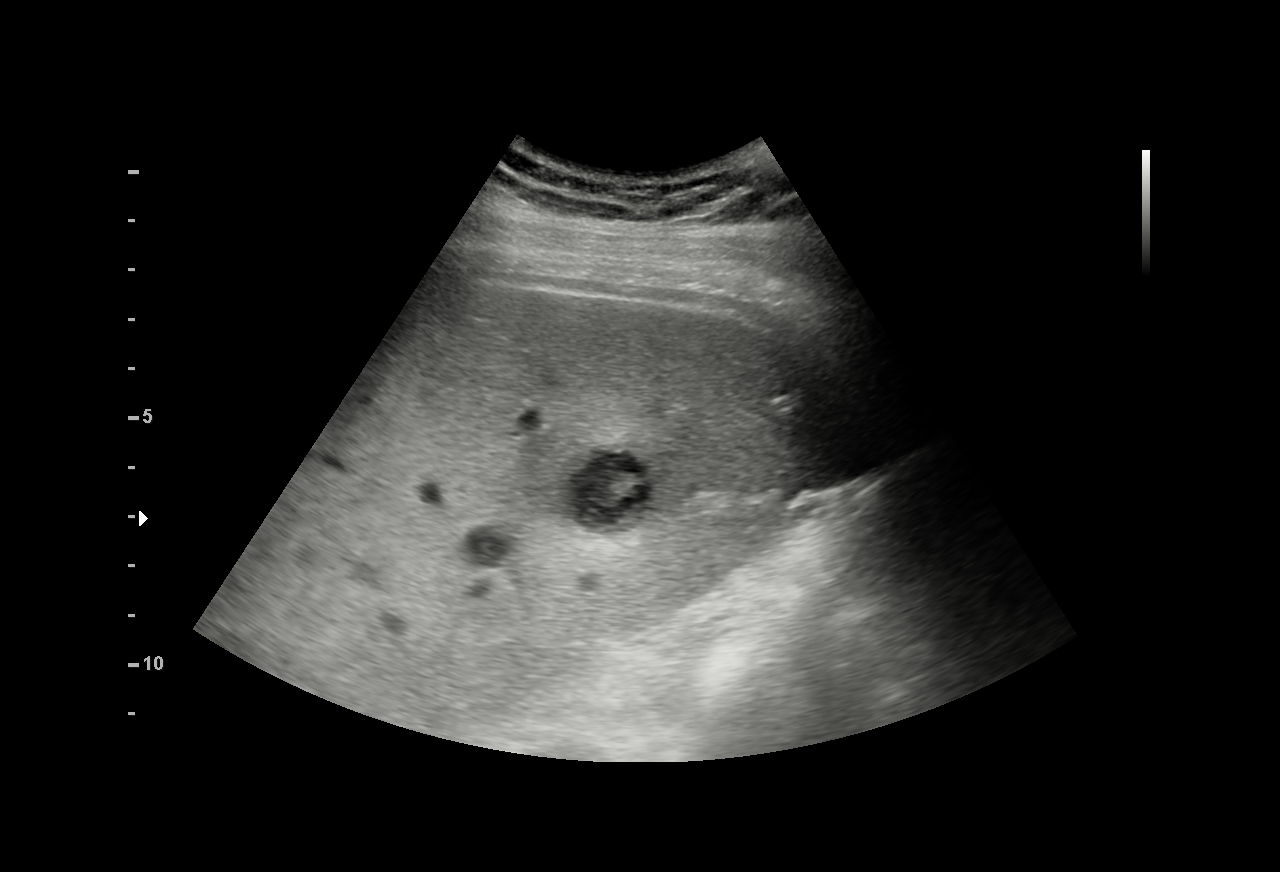
[im 2/3]
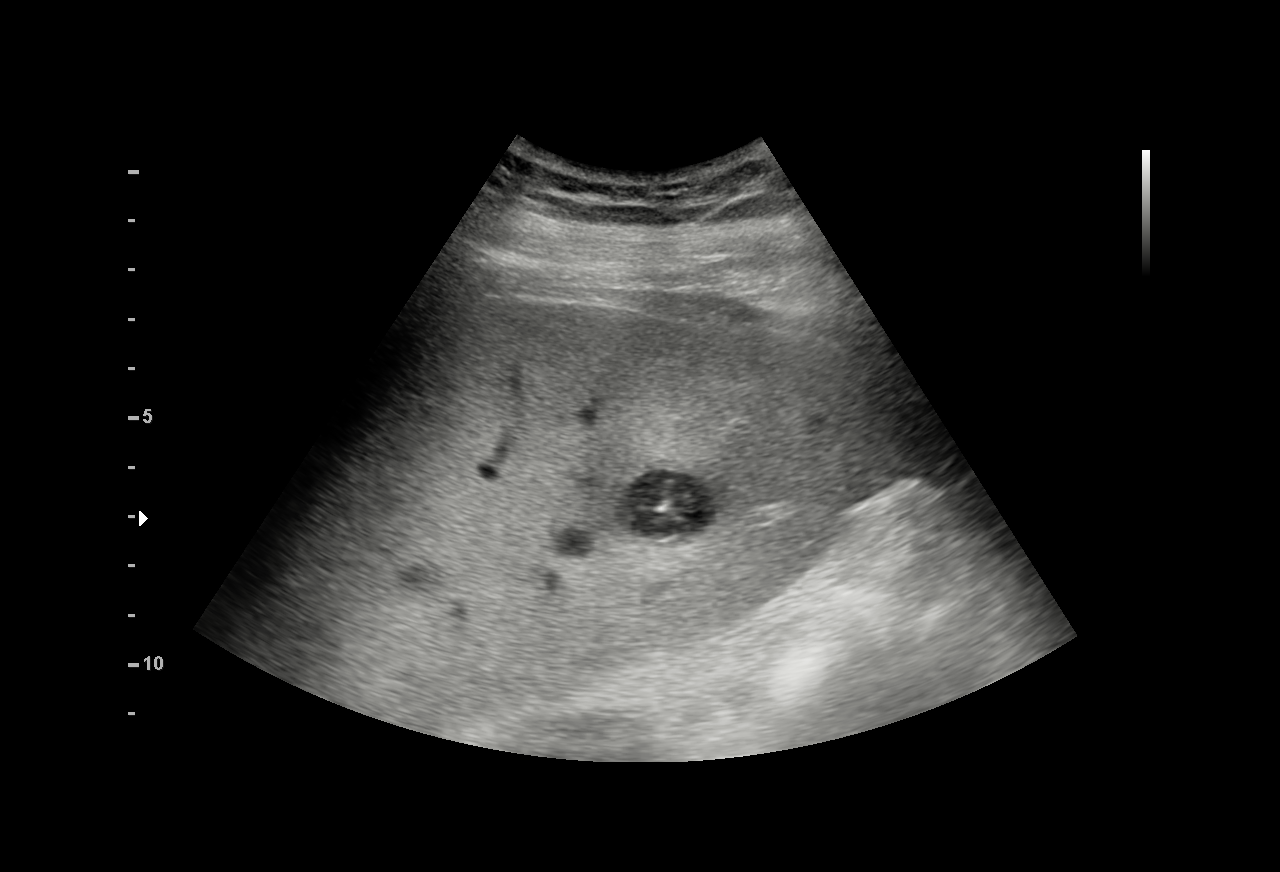
[im 3/3]
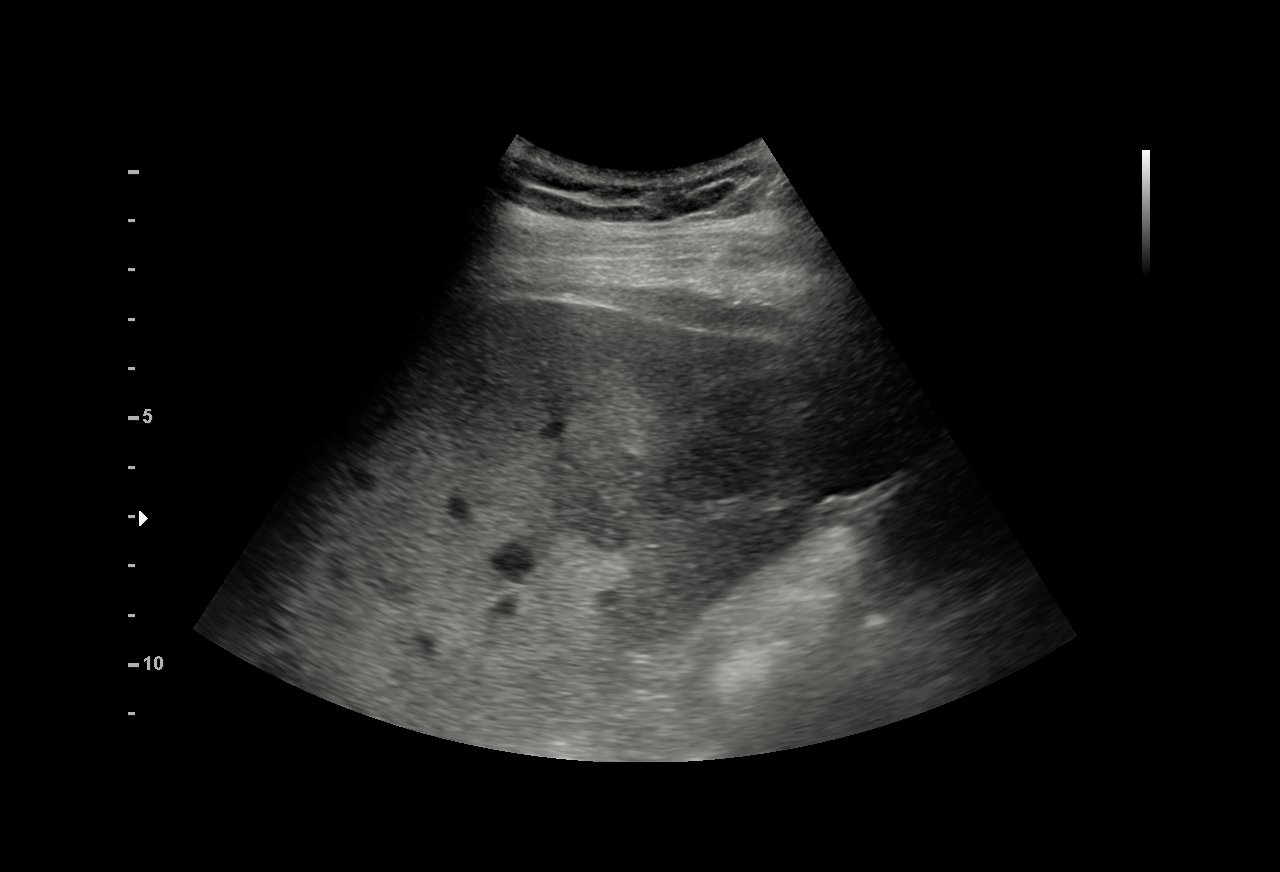

[3 of 3 positions shown; findings below may reference images not displayed]

EXAM:
Ultrasound-guided hepatic abscess aspiration

MEDICATIONS:
The patient is currently admitted to the hospital and receiving
intravenous antibiotics. The antibiotics were administered within an
appropriate time frame prior to the initiation of the procedure.

ANESTHESIA/SEDATION:
Fentanyl 100 mcg IV; Versed 2 mg IV

Moderate Sedation Time:  10

The patient was continuously monitored during the procedure by the
interventional radiology nurse under my direct supervision.

COMPLICATIONS:
None immediate.

PROCEDURE:
Informed written consent was obtained from the patient after a
thorough discussion of the procedural risks, benefits and
alternatives. All questions were addressed. Maximal Sterile Barrier
Technique was utilized including caps, mask, sterile gowns, sterile
gloves, sterile drape, hand hygiene and skin antiseptic. A timeout
was performed prior to the initiation of the procedure.

Preprocedure ultrasound evaluation of the right upper quadrant
demonstrated multifocal hypoechoic rounded lesions throughout the
hepatic parenchyma, most measuring 1 cm or less. An approximately
1.9 cm collection in the right lateral liver was targeted for
aspiration. Subdermal Local anesthesia was provided at the planned
needle entry site with 1% lidocaine. Under direct ultrasound
visualization, additional local anesthetic was administered about
the hepatic capsule. Under direct ultrasound visualization, a 19
gauge, 10 cm Yueh needle was advanced to the central aspect of the
fluid collection. Aspiration was performed yielding approximately 5
mL of purulence fluid. The sample was sent to the lab for culture.
The needle was removed. Postprocedure ultrasound demonstrated no
evidence of perihepatic hematoma or other complicating features. A
sterile bandage was applied. The patient was returned to the floor
in stable condition.
IMPRESSION: Technically successful ultrasound-guided aspiration of hepatic
abscess in the right lobe of the liver.

## 2024-04-20 ENCOUNTER — Encounter: Payer: Self-pay | Admitting: Family Medicine

## 2024-04-20 DIAGNOSIS — G47419 Narcolepsy without cataplexy: Secondary | ICD-10-CM

## 2024-04-20 MED ORDER — METHYLPHENIDATE HCL 10 MG PO TABS: 10.0000 mg | ORAL_TABLET | Freq: Two times a day (BID) | ORAL | 0 refills | Status: DC

## 2024-04-20 NOTE — Telephone Encounter (Signed)
 Refill request for Ritalin    LR  02/10/24, #60, 0 rf LOV  02/18/24 FOV  08/16/24   Please review and advise.  Thanks. Dm/cma

## 2024-05-29 ENCOUNTER — Other Ambulatory Visit: Payer: Self-pay

## 2024-05-29 ENCOUNTER — Emergency Department (HOSPITAL_BASED_OUTPATIENT_CLINIC_OR_DEPARTMENT_OTHER)
Admission: EM | Admit: 2024-05-29 | Discharge: 2024-05-29 | Disposition: A | Discharge status: Home / Self Care | Attending: Emergency Medicine | Admitting: Emergency Medicine

## 2024-05-29 ENCOUNTER — Encounter (HOSPITAL_BASED_OUTPATIENT_CLINIC_OR_DEPARTMENT_OTHER): Payer: Self-pay

## 2024-05-29 DIAGNOSIS — S01551A Open bite of lip, initial encounter: Secondary | ICD-10-CM | POA: Diagnosis present

## 2024-05-29 DIAGNOSIS — S01511A Laceration without foreign body of lip, initial encounter: Secondary | ICD-10-CM

## 2024-05-29 DIAGNOSIS — W540XXA Bitten by dog, initial encounter: Secondary | ICD-10-CM | POA: Diagnosis not present

## 2024-05-29 MED ORDER — AMOXICILLIN-POT CLAVULANATE 875-125 MG PO TABS: 1.0000 | ORAL_TABLET | Freq: Two times a day (BID) | ORAL | 0 refills | Status: DC

## 2024-05-29 MED ORDER — AMOXICILLIN-POT CLAVULANATE 875-125 MG PO TABS
1.0000 | ORAL_TABLET | Freq: Once | ORAL | Status: AC
  Administered 2024-05-29: 1 via ORAL
  Filled 2024-05-29: qty 1

## 2024-05-29 MED ORDER — ACETAMINOPHEN 500 MG PO TABS: 1000.0000 mg | ORAL_TABLET | Freq: Once | ORAL | Status: DC

## 2024-05-29 MED ORDER — ACETAMINOPHEN 500 MG PO TABS
500.0000 mg | ORAL_TABLET | Freq: Once | ORAL | Status: AC
  Administered 2024-05-29: 500 mg via ORAL
  Filled 2024-05-29: qty 1

## 2024-05-29 MED ORDER — OXYCODONE-ACETAMINOPHEN 5-325 MG PO TABS
1.0000 | ORAL_TABLET | Freq: Once | ORAL | Status: AC
  Administered 2024-05-29: 1 via ORAL
  Filled 2024-05-29: qty 1

## 2024-05-29 MED ORDER — LIDOCAINE-EPINEPHRINE-TETRACAINE (LET) TOPICAL GEL
3.0000 mL | Freq: Once | TOPICAL | Status: AC
  Administered 2024-05-29: 3 mL via TOPICAL
  Filled 2024-05-29: qty 3

## 2024-05-29 NOTE — ED Provider Notes (Signed)
 [place] EMERGENCY DEPARTMENT AT [facility] Provider Note   CSN: [id number] Arrival date & time: 05/29/24  1300     Patient presents with: Animal Bite (/)   Samuel Patterson is a 53 y.o. male with no significant past medical history presents with concern for a dog bite to his lip that occurred just prior to arrival. Denies any bites elsewhere. States the dog was his personal dog and is up-to-date on all vaccines.  Bleeding well-controlled upon arrival.    Animal Bite      Prior to Admission medications   Medication Sig Start Date End Date Taking? Authorizing Provider  amoxicillin -clavulanate (AUGMENTIN ) 875-125 MG tablet Take 1 tablet by mouth every 12 (twelve) hours. 05/30/24  Yes Veta Palma, PA-C  acetaminophen  (TYLENOL ) 325 MG tablet Take 2 tablets (650 mg total) by mouth every 8 (eight) hours as needed for moderate pain, headache or fever (headache). 05/15/22   Singh, Prashant K, MD  azelastine (ASTELIN) 0.1 % nasal spray Place into both nostrils 2 (two) times daily. Use in each nostril as directed    [provider]  Calcium Carbonate Antacid (TUMS PO) Take 1 tablet by mouth 2 (two) times daily as needed (acid reflux).    [provider]  Glucosamine-Chondroit-Vit C-Mn (GLUCOSAMINE CHONDR 500 COMPLEX PO) Take 500 mg by mouth daily.    [provider]  ibuprofen  (ADVIL ) 400 MG tablet Take 1 tablet (400 mg total) by mouth every 8 (eight) hours as needed. Take 400 mg by mouth 1-3 times a day as needed for pain 05/15/22   Singh, Prashant K, MD  METAMUCIL FIBER PO Take 1 Scoop by mouth daily.    [provider]  methylphenidate  (RITALIN ) 10 MG tablet Take 1 tablet (10 mg total) by mouth 2 (two) times daily. 04/20/24   Thedora Garnette HERO, MD  Multiple Vitamin (MULTIVITAMIN) tablet Take 2 tablets by mouth daily.    [provider]  Omega-3 Fatty Acids (FISH OIL ) 1000 MG CAPS Take 1,000 mg by mouth daily.    [provider]   omeprazole  (PRILOSEC ) 20 MG capsule Take 1 capsule (20 mg total) by mouth every other day. 05/15/22   Singh, Prashant K, MD  Phenylephrine HCl (SINEX REGULAR NA) Place 1 spray into the nose in the morning and at bedtime.    [provider]  omeprazole  (PRILOSEC  OTC) 20 MG tablet Take 20 mg by mouth every other day.  05/15/22  [provider]    Allergies: Patient has no known allergies.    Review of Systems  Skin:  Positive for wound.    Updated Vital Signs BP (!) 137/116 (BP Location: Right Arm)   Pulse 82   Temp (!) 97.5 F (36.4 C)   Resp 17   Ht 5' 11 (1.803 m)   Wt 95.3 kg   SpO2 100%   BMI 29.29 kg/m   Physical Exam Vitals and nursing note reviewed.  Constitutional:      Appearance: Normal appearance.  HENT:     Head: Atraumatic.     Comments: Dog bite to left lower lip that extends into [place] boarder. Bleeding well controlled. No foreign debris.  see picture below Pulmonary:     Effort: Pulmonary effort is normal.  Neurological:     General: No focal deficit present.     Mental Status: He is alert.  Psychiatric:        Mood and Affect: Mood normal.  Behavior: Behavior normal.       (all labs ordered are listed, but only abnormal results are displayed) Labs Reviewed - No data to display  EKG: None  Radiology: No results found.   .Laceration Repair  Date/Time: 05/29/2024 3:18 PM  Performed by: Veta Palma, PA-C Authorized by: Veta Palma, PA-C   Consent:    Consent obtained:  Verbal   Consent given by:  Patient   Risks, benefits, and alternatives were discussed: yes     Risks discussed:  Infection, pain and poor cosmetic result   Alternatives discussed:  No treatment Universal protocol:    Procedure explained and questions answered to patient or proxy's satisfaction: yes     Patient identity confirmed:  Verbally with patient Anesthesia:    Anesthesia method:  Topical application   Topical anesthetic:   LET Laceration details:    Location:  Lip   Lip location:  Lower exterior lip   Length (cm):  3   Depth (mm):  3 Pre-procedure details:    Preparation:  Patient was prepped and draped in usual sterile fashion Exploration:    Wound exploration: wound explored through full range of motion and entire depth of wound visualized     Wound extent: no foreign body     Contaminated: no   Treatment:    Area cleansed with:  Chlorhexidine   Amount of cleaning:  Standard   Irrigation solution:  Sterile saline   Irrigation volume:  1L   Irrigation method:  Syringe   Debridement:  None Skin repair:    Repair method:  Sutures   Suture size:  5-0   Suture material:  Chromic gut   Suture technique:  Simple interrupted   Number of sutures:  6 Approximation:    Vermilion border well-aligned: yes   Repair type:    Repair type:  Intermediate Post-procedure details:    Dressing:  Open (no dressing)   Procedure completion:  Tolerated well, no immediate complications    Medications Ordered in the ED  oxyCODONE -acetaminophen  (PERCOCET/ROXICET) 5-325 MG per tablet 1 tablet (1 tablet Oral Given 05/29/24 1418)  acetaminophen  (TYLENOL ) tablet 500 mg (500 mg Oral Given 05/29/24 1418)  lidocaine -EPINEPHrine -tetracaine  (LET) topical gel (3 mLs Topical Given 05/29/24 1423)  amoxicillin -clavulanate (AUGMENTIN ) 875-125 MG per tablet 1 tablet (1 tablet Oral Given 05/29/24 1448)                                    Medical Decision Making Risk OTC drugs. Prescription drug management.    Differential diagnosis includes but is not limited to laceration, wound infection, tendon injury, nerve injury, vascular injury, retained foreign body, fracture, dislocation  ED Course:  Upon initial evaluation, patient is well-appearing, no acute distress.  Patient sustained dog bite to his left lower lip.  Bite does extend through the vermilion border.  Bleeding well-controlled upon arrival.  No foreign body noted.  Denies  any bites elsewhere.  Dog is up-to-date on vaccines, no indication for rabies series.  Medications Given: Percocet Tylenol  Augmentin  LET  Dog bite to left lip was irrigated well with sterile saline.  LET gel applied and then laceration was repaired with 6 chromic gut sutures.  Vermilion border well aligned.  Patient tolerated this well.  He received his first dose of Augmentin  here today.  Stable and appropriate for discharge home    Impression: Dog bite to left lip Left lip laceration  Disposition:  Patient discharged home with instructions to keep laceration site clean with gentle soap and water. Allow sutures to absorb on their own. Tylenol  and ibuprofen  as needed for pain. Take 5 day course of Augmentin  as prescribed.  Return precautions given.   This chart was dictated using voice recognition software, Dragon. Despite the best efforts of this provider to proofread and correct errors, errors may still occur which can change documentation meaning.       Final diagnoses:  Dog bite, initial encounter  Lip laceration, initial encounter    ED Discharge Orders          Ordered    amoxicillin -clavulanate (AUGMENTIN ) 875-125 MG tablet  Every 12 hours        05/29/24 1515               Veta Palma, PA-C 05/29/24 1520    Cottie Donnice PARAS, MD 06/01/24 1058

## 2024-05-29 NOTE — ED Triage Notes (Signed)
 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 Pt reports dog is up to date on rabies shot.

## 2024-05-29 NOTE — Discharge Instructions (Signed)
 You had 6 absorbable sutures placed today.  The sutures will reabsorb on their own.  You do not need to have them removed.   You may gently clean the area around your laceration as needed with water.  You have been prescribed Augmentin . Take this antibiotic 2 times a day for the next 5 days. Take the full course of your antibiotic even if you start feeling better. Antibiotics may cause you to have diarrhea.  You were given your first dose here today.  You may take your next dose tomorrow morning.  Do not submerge your laceration in water (no baths, swimming) until it is fully healed. You may shower.   You may take up to 1000mg  of tylenol  every 6 hours as needed for pain. Do not take more then 4g per day.   You may use up to 600mg  ibuprofen  every 6 hours as needed for pain.  Do not exceed 2.4g of ibuprofen  per day.  Return to the ER should you develop fever, chills, pus drainage from your wound, redness around your wound.

## 2024-06-23 ENCOUNTER — Other Ambulatory Visit: Payer: Self-pay | Admitting: Family Medicine

## 2024-06-23 DIAGNOSIS — G47419 Narcolepsy without cataplexy: Secondary | ICD-10-CM

## 2024-06-23 NOTE — Telephone Encounter (Signed)
 Refill request for  Ritalin  10 mg LR 04/20/24, #60, 0 rf LOV3/27/25 FOV 08/16/24  Please review and advise.  Thanks. Dm/cma

## 2024-06-23 NOTE — Telephone Encounter (Unsigned)
 Copied from CRM [id number]. Topic: Clinical - Medication Refill >> Jun 23, 2024  1:16 PM Aaliyah S wrote: Medication: RITALIN  10 MG  Has the patient contacted their pharmacy? Yes (Agent: If no, request that the patient contact the pharmacy for the refill. If patient does not wish to contact the pharmacy document the reason why and proceed with request.) (Agent: If yes, when and what did the pharmacy advise?)  This is the patient's preferred pharmacy:  [facility] - [place], [place] - [address] [place] [address] Phone: [phone] Fax: [phone]  Is this the correct pharmacy for this prescription? Yes If no, delete pharmacy and type the correct one.   Has the prescription been filled recently? unsure  Is the patient out of the medication? Yes  Has the patient been seen for an appointment in the last year OR does the patient have an upcoming appointment? Yes  Can we respond through MyChart? Yes  Agent: Please be advised that Rx refills may take up to 3 business days. We ask that you follow-up with your pharmacy.

## 2024-08-16 ENCOUNTER — Encounter: Payer: Self-pay | Admitting: Family Medicine

## 2024-08-16 ENCOUNTER — Ambulatory Visit: Admitting: Family Medicine

## 2024-08-16 ENCOUNTER — Ambulatory Visit: Payer: Self-pay | Admitting: Family Medicine

## 2024-08-16 VITALS — BP 128/82 | HR 73 | Temp 97.8°F | Ht 71.0 in | Wt 212.8 lb

## 2024-08-16 DIAGNOSIS — G47419 Narcolepsy without cataplexy: Secondary | ICD-10-CM | POA: Diagnosis not present

## 2024-08-16 DIAGNOSIS — Z23 Encounter for immunization: Secondary | ICD-10-CM

## 2024-08-16 DIAGNOSIS — B351 Tinea unguium: Secondary | ICD-10-CM | POA: Insufficient documentation

## 2024-08-16 LAB — HEPATIC FUNCTION PANEL
ALT: 29 U/L (ref 0–53)
AST: 23 U/L (ref 0–37)
Albumin: 4.4 g/dL (ref 3.5–5.2)
Alkaline Phosphatase: 56 U/L (ref 39–117)
Bilirubin, Direct: 0.1 mg/dL (ref 0.0–0.3)
Total Bilirubin: 0.6 mg/dL (ref 0.2–1.2)
Total Protein: 7.5 g/dL (ref 6.0–8.3)

## 2024-08-16 MED ORDER — TERBINAFINE HCL 250 MG PO TABS: ORAL_TABLET | ORAL | 1 refills | Status: AC

## 2024-08-16 MED ORDER — METHYLPHENIDATE HCL 10 MG PO TABS: 10.0000 mg | ORAL_TABLET | Freq: Two times a day (BID) | ORAL | 0 refills | Status: DC

## 2024-08-16 NOTE — Progress Notes (Signed)
 [facility] [address] [place] [address] Dept: [phone] Dept Fax: [phone]  Chronic Care Office Visit  Subjective:    Patient ID: Samuel Patterson, male    DOB: 09/12/1971, 53 y.o..   MRN: [id number]  Chief Complaint  Patient presents with   Follow-up    6 month f/u Narcolepsy.  C/o having toenail fungus on both feet.  Will get the flu shot today.    History of Present Illness:  Patient is in today for reassessment of chronic medical issues.  Mr. Geraci has a history of narcolepsy. This was diagnosed in ~ 2002. He was intially treated with Provigil, but did not respond. He is now treated with methylphenidate  10 mg daily, which has worked well since. He denies any problems with appetite or falling asleep.   Mr. Dantonio has a history of onychomycosis for many years. He has tried various topical approaches, but has not experienced significant improvement.  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   Past Surgical History:  Procedure Laterality Date   IR US  GUIDE BX ASP/DRAIN  05/10/2022   TUBAL LIGATION  2017   VASECTOMY     Family History  Problem Relation Age of Onset   Cancer Mother        Breast   Dementia Mother    Heart disease Father    Colon polyps Father    Heart disease Brother 62       MI   Parkinson's disease Maternal Grandfather    Diabetes Paternal Grandmother    Stroke Paternal Grandmother    Heart disease Paternal Grandfather    Colon cancer Neg Hx    Esophageal cancer Neg Hx    Rectal cancer Neg Hx     Stomach cancer Neg Hx    Outpatient Medications Prior to Visit  Medication Sig Dispense Refill   acetaminophen  (TYLENOL ) 325 MG tablet Take 2 tablets (650 mg total) by mouth every 8 (eight) hours as needed for moderate pain, headache or fever (headache). 25 tablet 0   azelastine (ASTELIN) 0.1 % nasal spray Place into both nostrils 2 (two) times daily. Use in each nostril as directed     Calcium Carbonate Antacid (TUMS PO) Take 1 tablet by mouth 2 (two) times daily as needed (acid reflux).     Glucosamine-Chondroit-Vit C-Mn (GLUCOSAMINE CHONDR 500 COMPLEX PO) Take 500 mg by mouth daily.     ibuprofen  (ADVIL ) 400 MG tablet Take 1 tablet (400 mg total) by mouth every 8 (eight) hours as needed. Take 400 mg by mouth 1-3 times a day as needed for pain 20 tablet 0   METAMUCIL FIBER PO Take 1 Scoop by mouth daily.     Multiple Vitamin (MULTIVITAMIN) tablet Take 2 tablets by mouth daily.     Omega-3 Fatty Acids (FISH OIL ) 1000 MG CAPS Take 1,000 mg by mouth daily.     omeprazole  (PRILOSEC ) 20 MG capsule Take 1 capsule (20 mg total) by mouth every other day. 30 capsule 0   Phenylephrine HCl (SINEX REGULAR NA) Place 1 spray into the nose in the morning and at  bedtime.     methylphenidate  (RITALIN ) 10 MG tablet TAKE 1 TABLET BY MOUTH 2 TIMES DAILY. 60 tablet 0   amoxicillin -clavulanate (AUGMENTIN ) 875-125 MG tablet Take 1 tablet by mouth every 12 (twelve) hours. 9 tablet 0   No facility-administered medications prior to visit.   No Known Allergies Objective:   Today's Vitals   08/16/24 0832  BP: 128/82  Pulse: 73  Temp: 97.8 F (36.6 C)  TempSrc: Temporal  SpO2: 98%  Weight: 212 lb 12.8 oz (96.5 kg)  Height: 5' 11 (1.803 m)   Body mass index is 29.68 kg/m.   General: Well developed, well nourished. No acute distress. Extremities: Multiple nails that are thickened and discolored, with crumbling of nail at the right 1st toe.  Psych: Alert and oriented. Normal mood and affect.  Health  Maintenance Due  Topic Date Due   Hepatitis B Vaccines 19-59 Average Risk (1 of 3 - 19+ 3-dose series) Never done   Pneumococcal Vaccine: 50+ Years (1 of 1 - PCV) Never done   Influenza Vaccine  06/24/2024     Assessment & Plan:   Problem List Items Addressed This Visit       Musculoskeletal and Integument   Onychomycosis   Discussed potential approaches to clearing the toenail fungus. I will check LFTs today. If normal, we will start a regimen of pulse therapy with terbinafine  for 6 months.      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 Toleratign therapy without concerning side effects. Continue methylphenidate  (Ritalin ) 10 mg daily.      Relevant Medications   methylphenidate  (RITALIN ) 10 MG tablet   Other Visit Diagnoses       Need for immunization against influenza       Relevant Orders   Flu vaccine trivalent PF, 6mos and older(Flulaval,Afluria,Fluarix,Fluzone) (Completed)     Need for pneumococcal 20-valent conjugate vaccination       Relevant Orders   Pneumococcal conjugate vaccine 20-valent (Completed)       Return in about 4 months (around 12/16/2024) for Reassessment.   Garnette CHRISTELLA Simpler, MD

## 2024-08-16 NOTE — Assessment & Plan Note (Signed)
 Discussed potential approaches to clearing the toenail fungus. I will check LFTs today. If normal, we will start a regimen of pulse therapy with terbinafine  for 6 months.

## 2024-08-16 NOTE — Assessment & Plan Note (Signed)
 Stable. Toleratign therapy without concerning side effects. Continue methylphenidate  (Ritalin ) 10 mg daily.

## 2024-08-17 ENCOUNTER — Other Ambulatory Visit: Payer: Self-pay | Admitting: Medical Genetics

## 2024-09-01 ENCOUNTER — Other Ambulatory Visit

## 2024-09-01 DIAGNOSIS — Z006 Encounter for examination for normal comparison and control in clinical research program: Secondary | ICD-10-CM

## 2024-09-10 LAB — GENECONNECT MOLECULAR SCREEN: Genetic Analysis Overall Interpretation: NEGATIVE

## 2024-10-05 ENCOUNTER — Encounter: Payer: Self-pay | Admitting: Family Medicine

## 2024-10-05 DIAGNOSIS — G47419 Narcolepsy without cataplexy: Secondary | ICD-10-CM

## 2024-10-05 MED ORDER — METHYLPHENIDATE HCL 10 MG PO TABS: 10.0000 mg | ORAL_TABLET | Freq: Two times a day (BID) | ORAL | 0 refills | Status: DC

## 2024-10-05 NOTE — Telephone Encounter (Signed)
 Refill request for  Methylphenidate  10 mg LR 08/16/24,#60, 0 rf LOV  08/16/24 FOV 12/16/24  Please review and advise.  Thanks. Dm/cma

## 2024-12-07 ENCOUNTER — Encounter: Payer: Self-pay | Admitting: Family Medicine

## 2024-12-07 DIAGNOSIS — G47419 Narcolepsy without cataplexy: Secondary | ICD-10-CM

## 2024-12-08 MED ORDER — METHYLPHENIDATE HCL 10 MG PO TABS: 10.0000 mg | ORAL_TABLET | Freq: Two times a day (BID) | ORAL | 0 refills | Status: AC

## 2024-12-08 NOTE — Telephone Encounter (Signed)
 Refill request for   methylphenidate  10 MG  LR 10/05/24, #60, 0 rf LOV  08/16/24 FOV  12/16/24  Please review and advise.  Thanks. Dm/cma

## 2024-12-16 ENCOUNTER — Ambulatory Visit: Admitting: Family Medicine

## 2024-12-16 ENCOUNTER — Encounter: Payer: Self-pay | Admitting: Family Medicine

## 2024-12-16 VITALS — BP 130/84 | HR 87 | Temp 97.7°F | Ht 71.0 in | Wt 215.6 lb

## 2024-12-16 DIAGNOSIS — G47419 Narcolepsy without cataplexy: Secondary | ICD-10-CM | POA: Diagnosis not present

## 2024-12-16 DIAGNOSIS — B351 Tinea unguium: Secondary | ICD-10-CM

## 2024-12-16 NOTE — Assessment & Plan Note (Addendum)
 Samuel Patterson has had a good response. Discussed that he should experience further clearing over the next several months based on the medicine he has already taken. I feel he could stop the terbinafine  after his current week's pulse.

## 2024-12-16 NOTE — Assessment & Plan Note (Addendum)
 Stable. Tolerating therapy without concerning side effects. Continue methylphenidate  (Ritalin ) 10 mg daily.

## 2024-12-16 NOTE — Progress Notes (Signed)
 " [facility] [address] [place] [address] Dept: [phone] Dept Fax: [phone]  Chronic Care Office Visit  Subjective:    Patient ID: Samuel Patterson, male    DOB: 1970-12-15, 54 y.o..   MRN: [id number]  Chief Complaint  Patient presents with   narcolepsy    4 month f/u.  No concerns.      History of Present Illness:  Patient is in today for reassessment of chronic medical conditions.   Mr. Heintzelman has a history of narcolepsy. This was diagnosed in ~ 2002. He was intially treated with Provigil, but did not respond. He is now treated with methylphenidate  10 mg daily, which has worked well since. He denies any problems with appetite or falling asleep.    Mr. Shiner has a history of onychomycosis for many years. He has tried various topical approaches, but has not experienced significant improvement. In Sept., I prescribed terbinafine  pulsed therapy. He has noted significant improvement in his nails and is pleased with his response.  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   Past Surgical History:  Procedure Laterality Date   IR US  GUIDE BX ASP/DRAIN  05/10/2022   TUBAL LIGATION  2017   VASECTOMY     Family History  Problem Relation Age of Onset   Cancer Mother        Breast   Dementia Mother    Heart disease Father    Colon polyps Father    Heart disease Brother 1       MI   Parkinson's disease Maternal Grandfather    Diabetes Paternal Grandmother    Stroke Paternal Grandmother    Heart disease  Paternal Grandfather    Colon cancer Neg Hx    Esophageal cancer Neg Hx    Rectal cancer Neg Hx    Stomach cancer Neg Hx    Outpatient Medications Prior to Visit  Medication Sig Dispense Refill   acetaminophen  (TYLENOL ) 325 MG tablet Take 2 tablets (650 mg total) by mouth every 8 (eight) hours as needed for moderate pain, headache or fever (headache). 25 tablet 0   azelastine (ASTELIN) 0.1 % nasal spray Place into both nostrils 2 (two) times daily. Use in each nostril as directed     Calcium Carbonate Antacid (TUMS PO) Take 1 tablet by mouth 2 (two) times daily as needed (acid reflux).     Glucosamine-Chondroit-Vit C-Mn (GLUCOSAMINE CHONDR 500 COMPLEX PO) Take 500 mg by mouth daily.     ibuprofen  (ADVIL ) 400 MG tablet Take 1 tablet (400 mg total) by mouth every 8 (eight) hours as needed. Take 400 mg by mouth 1-3 times a day as needed for pain 20 tablet 0   METAMUCIL FIBER PO Take 1 Scoop by mouth daily.     methylphenidate  (RITALIN ) 10 MG tablet Take 1 tablet (10 mg total) by mouth 2 (two) times daily. 60 tablet 0   Multiple Vitamin (MULTIVITAMIN) tablet Take 2 tablets by mouth daily.     Omega-3 Fatty Acids (FISH OIL ) 1000 MG CAPS Take 1,000 mg by mouth daily.  omeprazole  (PRILOSEC ) 20 MG capsule Take 1 capsule (20 mg total) by mouth every other day. 30 capsule 0   Phenylephrine HCl (SINEX REGULAR NA) Place 1 spray into the nose in the morning and at bedtime.     terbinafine  (LAMISIL ) 250 MG tablet Take 1 tablet (250 mg total) by mouth daily for 1 week. Repeat monthly for 6 months. 21 tablet 1   No facility-administered medications prior to visit.   Allergies[1]   Objective:   Today's Vitals   12/16/24 0805  BP: 130/84  Pulse: 87  Temp: 97.7 F (36.5 C)  TempSrc: Temporal  SpO2: 99%  Weight: 215 lb 9.6 oz (97.8 kg)  Height: 5' 11 (1.803 m)   Body mass index is 30.07 kg/m.   General: Well developed, well nourished. No acute distress. Foot: Nails show 80-90% clearance of  prior discoloration and have grown out almost the full length of the nail bed. Psych: Alert and oriented. Normal mood and affect.  Health Maintenance Due  Topic Date Due   Hepatitis B Vaccines 19-59 Average Risk (1 of 3 - 19+ 3-dose series) Never done   COVID-19 Vaccine (3 - Pfizer risk series) 03/16/2020   Lab Results     Latest Ref Rng & Units 08/16/2024    9:20 AM 02/18/2023    9:02 AM 06/03/2022    2:37 PM  Hepatic Function  Total Protein 6.0 - 8.3 g/dL 7.5  7.7  7.3   Albumin 3.5 - 5.2 g/dL 4.4  4.6    AST 0 - 37 U/L 23  24  17    ALT 0 - 53 U/L 29  32  24   Alk Phosphatase 39 - 117 U/L 56  74    Total Bilirubin 0.2 - 1.2 mg/dL 0.6  0.6  0.4   Bilirubin, Direct 0.0 - 0.3 mg/dL 0.1      Assessment & Plan:   Problem List Items Addressed This Visit       Nervous and Auditory   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 Tolerating therapy without concerning side effects. Continue methylphenidate  (Ritalin ) 10 mg daily.        Musculoskeletal and Integument   Onychomycosis   Mr. Hellen has had a good response. Discussed that he should experience further clearing over the next several months based on the medicine he has already taken. I feel he could stop the terbinafine  after his current week's pulse.       Return in about 6 months (around 06/15/2025) for Reassessment.   Garnette CHRISTELLA Simpler, MD  I,Emily Lagle,acting as a scribe for Garnette CHRISTELLA Simpler, MD.,have documented all relevant documentation on the behalf of Garnette CHRISTELLA Simpler, MD.  I, Garnette CHRISTELLA Simpler, MD, have reviewed all documentation for this visit. The documentation on 12/16/2024 for the exam, diagnosis, procedures, and orders are all accurate and complete.      [1] No Known Allergies  "

## 2025-06-15 ENCOUNTER — Ambulatory Visit: Admitting: Family Medicine
